# Patient Record
Sex: Male | Born: 1935 | Race: White | Hispanic: No | Marital: Married | Smoking: Former smoker
Health system: Southern US, Community
[De-identification: ages and names within clinical notes are randomized; demographics above are authoritative.]

## PROBLEM LIST (undated history)

## (undated) DIAGNOSIS — Z8572 Personal history of non-Hodgkin lymphomas: Secondary | ICD-10-CM

## (undated) DIAGNOSIS — F410 Panic disorder [episodic paroxysmal anxiety] without agoraphobia: Secondary | ICD-10-CM

## (undated) DIAGNOSIS — E7800 Pure hypercholesterolemia, unspecified: Secondary | ICD-10-CM

## (undated) DIAGNOSIS — C44310 Basal cell carcinoma of skin of unspecified parts of face: Secondary | ICD-10-CM

## (undated) DIAGNOSIS — M1990 Unspecified osteoarthritis, unspecified site: Secondary | ICD-10-CM

## (undated) DIAGNOSIS — I2510 Atherosclerotic heart disease of native coronary artery without angina pectoris: Secondary | ICD-10-CM

## (undated) DIAGNOSIS — D472 Monoclonal gammopathy: Principal | ICD-10-CM

## (undated) DIAGNOSIS — C4490 Unspecified malignant neoplasm of skin, unspecified: Secondary | ICD-10-CM

## (undated) DIAGNOSIS — M549 Dorsalgia, unspecified: Secondary | ICD-10-CM

## (undated) DIAGNOSIS — M889 Osteitis deformans of unspecified bone: Secondary | ICD-10-CM

## (undated) DIAGNOSIS — A498 Other bacterial infections of unspecified site: Secondary | ICD-10-CM

## (undated) DIAGNOSIS — C8590 Non-Hodgkin lymphoma, unspecified, unspecified site: Secondary | ICD-10-CM

## (undated) DIAGNOSIS — I10 Essential (primary) hypertension: Secondary | ICD-10-CM

## (undated) DIAGNOSIS — K219 Gastro-esophageal reflux disease without esophagitis: Secondary | ICD-10-CM

## (undated) DIAGNOSIS — J310 Chronic rhinitis: Secondary | ICD-10-CM

## (undated) DIAGNOSIS — A0472 Enterocolitis due to Clostridium difficile, not specified as recurrent: Secondary | ICD-10-CM

## (undated) HISTORY — PX: BACK SURGERY: SHX140

## (undated) HISTORY — DX: Dorsalgia, unspecified: M54.9

## (undated) HISTORY — PX: HERNIA REPAIR: SHX51

## (undated) HISTORY — DX: Monoclonal gammopathy: D47.2

## (undated) HISTORY — PX: NECK SURGERY: SHX720

## (undated) HISTORY — PX: PROSTATECTOMY: SHX69

## (undated) HISTORY — DX: Chronic rhinitis: J31.0

## (undated) HISTORY — DX: Basal cell carcinoma of skin of unspecified parts of face: C44.310

## (undated) HISTORY — DX: Personal history of non-Hodgkin lymphomas: Z85.72

## (undated) HISTORY — PX: SKIN BIOPSY: SHX1

## (undated) HISTORY — DX: Atherosclerotic heart disease of native coronary artery without angina pectoris: I25.10

---

## 1998-02-28 ENCOUNTER — Other Ambulatory Visit: Admission: RE | Admit: 1998-02-28 | Discharge: 1998-02-28 | Payer: Self-pay | Admitting: Hematology and Oncology

## 1998-05-23 ENCOUNTER — Other Ambulatory Visit: Admission: RE | Admit: 1998-05-23 | Discharge: 1998-05-23 | Payer: Self-pay | Admitting: Hematology and Oncology

## 1998-08-06 ENCOUNTER — Emergency Department (HOSPITAL_COMMUNITY): Admission: EM | Admit: 1998-08-06 | Discharge: 1998-08-06 | Payer: Self-pay | Admitting: Emergency Medicine

## 1998-09-06 ENCOUNTER — Ambulatory Visit (HOSPITAL_COMMUNITY): Admission: RE | Admit: 1998-09-06 | Discharge: 1998-09-06 | Payer: Self-pay | Admitting: General Surgery

## 1998-11-07 ENCOUNTER — Ambulatory Visit (HOSPITAL_BASED_OUTPATIENT_CLINIC_OR_DEPARTMENT_OTHER): Admission: RE | Admit: 1998-11-07 | Discharge: 1998-11-07 | Payer: Self-pay | Admitting: Urology

## 1998-11-07 ENCOUNTER — Emergency Department (HOSPITAL_COMMUNITY): Admission: EM | Admit: 1998-11-07 | Discharge: 1998-11-07 | Payer: Self-pay | Admitting: Emergency Medicine

## 1999-12-07 ENCOUNTER — Encounter: Payer: Self-pay | Admitting: Oncology

## 1999-12-07 ENCOUNTER — Encounter: Admission: RE | Admit: 1999-12-07 | Discharge: 1999-12-07 | Payer: Self-pay | Admitting: Oncology

## 1999-12-10 ENCOUNTER — Encounter: Payer: Self-pay | Admitting: Oncology

## 1999-12-10 ENCOUNTER — Encounter: Admission: RE | Admit: 1999-12-10 | Discharge: 1999-12-10 | Payer: Self-pay | Admitting: Oncology

## 2000-04-04 ENCOUNTER — Encounter: Payer: Self-pay | Admitting: Family Medicine

## 2000-04-04 ENCOUNTER — Encounter: Admission: RE | Admit: 2000-04-04 | Discharge: 2000-04-04 | Payer: Self-pay | Admitting: Family Medicine

## 2000-07-28 ENCOUNTER — Encounter: Payer: Self-pay | Admitting: Emergency Medicine

## 2000-07-28 ENCOUNTER — Inpatient Hospital Stay (HOSPITAL_COMMUNITY): Admission: EM | Admit: 2000-07-28 | Discharge: 2000-07-30 | Payer: Self-pay | Admitting: Emergency Medicine

## 2000-07-30 ENCOUNTER — Encounter: Payer: Self-pay | Admitting: Cardiology

## 2001-06-12 ENCOUNTER — Encounter: Payer: Self-pay | Admitting: Oncology

## 2001-06-12 ENCOUNTER — Encounter: Admission: RE | Admit: 2001-06-12 | Discharge: 2001-06-12 | Payer: Self-pay | Admitting: Oncology

## 2001-10-07 ENCOUNTER — Encounter: Admission: RE | Admit: 2001-10-07 | Discharge: 2001-10-07 | Payer: Self-pay | Admitting: Family Medicine

## 2001-10-07 ENCOUNTER — Encounter: Payer: Self-pay | Admitting: Family Medicine

## 2001-10-21 ENCOUNTER — Encounter: Payer: Self-pay | Admitting: Family Medicine

## 2001-10-21 ENCOUNTER — Encounter: Admission: RE | Admit: 2001-10-21 | Discharge: 2001-10-21 | Payer: Self-pay | Admitting: Family Medicine

## 2001-11-27 ENCOUNTER — Encounter: Admission: RE | Admit: 2001-11-27 | Discharge: 2001-11-27 | Payer: Self-pay | Admitting: Family Medicine

## 2001-11-27 ENCOUNTER — Encounter: Payer: Self-pay | Admitting: Family Medicine

## 2001-12-17 ENCOUNTER — Ambulatory Visit (HOSPITAL_COMMUNITY): Admission: RE | Admit: 2001-12-17 | Discharge: 2001-12-17 | Payer: Self-pay | Admitting: Neurosurgery

## 2001-12-17 ENCOUNTER — Encounter: Payer: Self-pay | Admitting: Neurosurgery

## 2001-12-31 ENCOUNTER — Inpatient Hospital Stay (HOSPITAL_COMMUNITY): Admission: RE | Admit: 2001-12-31 | Discharge: 2002-01-01 | Payer: Self-pay | Admitting: Neurosurgery

## 2001-12-31 ENCOUNTER — Encounter: Payer: Self-pay | Admitting: Neurosurgery

## 2002-04-01 ENCOUNTER — Ambulatory Visit (HOSPITAL_BASED_OUTPATIENT_CLINIC_OR_DEPARTMENT_OTHER): Admission: RE | Admit: 2002-04-01 | Discharge: 2002-04-01 | Payer: Self-pay | Admitting: Urology

## 2002-06-14 ENCOUNTER — Encounter: Admission: RE | Admit: 2002-06-14 | Discharge: 2002-06-14 | Payer: Self-pay | Admitting: Oncology

## 2002-06-14 ENCOUNTER — Encounter: Payer: Self-pay | Admitting: Oncology

## 2002-11-09 ENCOUNTER — Encounter: Payer: Self-pay | Admitting: Neurosurgery

## 2002-11-11 ENCOUNTER — Inpatient Hospital Stay (HOSPITAL_COMMUNITY): Admission: RE | Admit: 2002-11-11 | Discharge: 2002-11-12 | Payer: Self-pay | Admitting: Neurosurgery

## 2002-11-11 ENCOUNTER — Encounter: Payer: Self-pay | Admitting: Neurosurgery

## 2003-01-18 ENCOUNTER — Encounter: Payer: Self-pay | Admitting: Family Medicine

## 2003-01-18 ENCOUNTER — Encounter: Admission: RE | Admit: 2003-01-18 | Discharge: 2003-01-18 | Payer: Self-pay | Admitting: Family Medicine

## 2003-02-21 ENCOUNTER — Ambulatory Visit (HOSPITAL_COMMUNITY): Admission: RE | Admit: 2003-02-21 | Discharge: 2003-02-21 | Payer: Self-pay | Admitting: Family Medicine

## 2003-02-21 ENCOUNTER — Encounter: Payer: Self-pay | Admitting: Family Medicine

## 2003-03-23 ENCOUNTER — Encounter: Admission: RE | Admit: 2003-03-23 | Discharge: 2003-03-23 | Payer: Self-pay | Admitting: Family Medicine

## 2003-06-13 ENCOUNTER — Encounter: Payer: Self-pay | Admitting: Oncology

## 2003-06-13 ENCOUNTER — Encounter: Admission: RE | Admit: 2003-06-13 | Discharge: 2003-06-13 | Payer: Self-pay | Admitting: Oncology

## 2004-03-08 ENCOUNTER — Ambulatory Visit (HOSPITAL_COMMUNITY): Admission: RE | Admit: 2004-03-08 | Discharge: 2004-03-08 | Payer: Self-pay | Admitting: Gastroenterology

## 2004-03-08 ENCOUNTER — Encounter (INDEPENDENT_AMBULATORY_CARE_PROVIDER_SITE_OTHER): Payer: Self-pay | Admitting: *Deleted

## 2004-12-04 ENCOUNTER — Encounter: Admission: RE | Admit: 2004-12-04 | Discharge: 2004-12-04 | Payer: Self-pay | Admitting: Orthopedic Surgery

## 2004-12-22 ENCOUNTER — Encounter: Admission: RE | Admit: 2004-12-22 | Discharge: 2004-12-22 | Payer: Self-pay | Admitting: Internal Medicine

## 2005-02-01 ENCOUNTER — Encounter: Admission: RE | Admit: 2005-02-01 | Discharge: 2005-02-01 | Payer: Self-pay | Admitting: Family Medicine

## 2005-02-05 ENCOUNTER — Inpatient Hospital Stay (HOSPITAL_COMMUNITY): Admission: EM | Admit: 2005-02-05 | Discharge: 2005-02-08 | Payer: Self-pay | Admitting: Emergency Medicine

## 2005-02-08 ENCOUNTER — Inpatient Hospital Stay (HOSPITAL_COMMUNITY)
Admission: RE | Admit: 2005-02-08 | Discharge: 2005-02-19 | Payer: Self-pay | Admitting: Physical Medicine & Rehabilitation

## 2005-02-08 ENCOUNTER — Ambulatory Visit: Payer: Self-pay | Admitting: Physical Medicine & Rehabilitation

## 2005-04-09 ENCOUNTER — Ambulatory Visit: Payer: Self-pay | Admitting: Oncology

## 2005-06-18 ENCOUNTER — Ambulatory Visit (HOSPITAL_COMMUNITY): Admission: RE | Admit: 2005-06-18 | Discharge: 2005-06-18 | Payer: Self-pay | Admitting: Orthopedic Surgery

## 2005-10-15 ENCOUNTER — Ambulatory Visit: Payer: Self-pay | Admitting: Oncology

## 2005-12-23 ENCOUNTER — Ambulatory Visit: Payer: Self-pay | Admitting: Oncology

## 2006-04-11 ENCOUNTER — Encounter: Admission: RE | Admit: 2006-04-11 | Discharge: 2006-04-11 | Payer: Self-pay | Admitting: Orthopedic Surgery

## 2006-04-11 ENCOUNTER — Ambulatory Visit: Payer: Self-pay | Admitting: Oncology

## 2006-04-15 LAB — CBC WITH DIFFERENTIAL/PLATELET
BASO%: 0.6 % (ref 0.0–2.0)
EOS%: 2.6 % (ref 0.0–7.0)
HCT: 34.8 % — ABNORMAL LOW (ref 38.7–49.9)
LYMPH%: 27 % (ref 14.0–48.0)
MCH: 32.5 pg (ref 28.0–33.4)
MCHC: 34.8 g/dL (ref 32.0–35.9)
MONO%: 11.3 % (ref 0.0–13.0)
NEUT%: 58.5 % (ref 40.0–75.0)
Platelets: 150 10*3/uL (ref 145–400)

## 2006-04-17 LAB — COMPREHENSIVE METABOLIC PANEL
ALT: 8 U/L (ref 0–40)
AST: 12 U/L (ref 0–37)
CO2: 24 mEq/L (ref 19–32)
Calcium: 9.1 mg/dL (ref 8.4–10.5)
Chloride: 108 mEq/L (ref 96–112)
Creatinine, Ser: 1.05 mg/dL (ref 0.40–1.50)
Sodium: 141 mEq/L (ref 135–145)
Total Protein: 7 g/dL (ref 6.0–8.3)

## 2006-04-17 LAB — LACTATE DEHYDROGENASE: LDH: 122 U/L (ref 94–250)

## 2006-04-17 LAB — IMMUNOFIXATION ELECTROPHORESIS
IgA: 130 mg/dL (ref 68–378)
IgG (Immunoglobin G), Serum: 1030 mg/dL (ref 694–1618)

## 2006-04-23 LAB — UIFE/LIGHT CHAINS/TP QN, 24-HR UR
Beta, Urine: DETECTED — AB
Free Lambda Excretion/Day: 2.47 mg/d
Free Lambda Lt Chains,Ur: 0.13 mg/dL (ref 0.08–1.01)
Free Lt Chn Excr Rate: 142.88 mg/d
Gamma Globulin, Urine: DETECTED — AB
Time: 24 hours
Volume, Urine: 1900 mL

## 2006-07-04 ENCOUNTER — Encounter: Admission: RE | Admit: 2006-07-04 | Discharge: 2006-07-04 | Payer: Self-pay | Admitting: Gastroenterology

## 2006-08-07 ENCOUNTER — Ambulatory Visit: Payer: Self-pay | Admitting: Oncology

## 2007-02-04 ENCOUNTER — Ambulatory Visit: Payer: Self-pay | Admitting: Oncology

## 2007-02-09 LAB — CBC WITH DIFFERENTIAL/PLATELET
BASO%: 0.7 % (ref 0.0–2.0)
Basophils Absolute: 0 10*3/uL (ref 0.0–0.1)
EOS%: 2.3 % (ref 0.0–7.0)
HCT: 37.5 % — ABNORMAL LOW (ref 38.7–49.9)
HGB: 13.5 g/dL (ref 13.0–17.1)
LYMPH%: 22.9 % (ref 14.0–48.0)
MCH: 33.4 pg (ref 28.0–33.4)
MCHC: 36.1 g/dL — ABNORMAL HIGH (ref 32.0–35.9)
MCV: 92.6 fL (ref 81.6–98.0)
MONO%: 9.7 % (ref 0.0–13.0)
NEUT%: 64.4 % (ref 40.0–75.0)
Platelets: 141 10*3/uL — ABNORMAL LOW (ref 145–400)
lymph#: 1 10*3/uL (ref 0.9–3.3)

## 2007-02-09 LAB — MORPHOLOGY

## 2007-02-15 LAB — IMMUNOFIXATION ELECTROPHORESIS
IgA: 156 mg/dL (ref 68–378)
IgG (Immunoglobin G), Serum: 1260 mg/dL (ref 694–1618)

## 2007-02-15 LAB — COMPREHENSIVE METABOLIC PANEL
Albumin: 4.5 g/dL (ref 3.5–5.2)
Alkaline Phosphatase: 89 U/L (ref 39–117)
Calcium: 9.8 mg/dL (ref 8.4–10.5)
Chloride: 105 mEq/L (ref 96–112)
Glucose, Bld: 115 mg/dL — ABNORMAL HIGH (ref 70–99)
Potassium: 3.9 mEq/L (ref 3.5–5.3)
Sodium: 140 mEq/L (ref 135–145)
Total Protein: 7.3 g/dL (ref 6.0–8.3)

## 2007-02-15 LAB — KAPPA/LAMBDA LIGHT CHAINS: Kappa free light chain: 12.4 mg/dL — ABNORMAL HIGH (ref 0.33–1.94)

## 2007-08-07 ENCOUNTER — Ambulatory Visit: Payer: Self-pay | Admitting: Oncology

## 2007-08-11 LAB — CBC WITH DIFFERENTIAL/PLATELET
Basophils Absolute: 0 10*3/uL (ref 0.0–0.1)
Eosinophils Absolute: 0.1 10*3/uL (ref 0.0–0.5)
HCT: 36.1 % — ABNORMAL LOW (ref 38.7–49.9)
HGB: 12.9 g/dL — ABNORMAL LOW (ref 13.0–17.1)
MONO#: 0.4 10*3/uL (ref 0.1–0.9)
NEUT#: 2.4 10*3/uL (ref 1.5–6.5)
NEUT%: 65 % (ref 40.0–75.0)
WBC: 3.7 10*3/uL — ABNORMAL LOW (ref 4.0–10.0)
lymph#: 0.8 10*3/uL — ABNORMAL LOW (ref 0.9–3.3)

## 2007-08-11 LAB — MORPHOLOGY: PLT EST: DECREASED

## 2007-08-13 LAB — COMPREHENSIVE METABOLIC PANEL
ALT: 11 U/L (ref 0–53)
AST: 15 U/L (ref 0–37)
CO2: 26 mEq/L (ref 19–32)
Calcium: 9.4 mg/dL (ref 8.4–10.5)
Chloride: 105 mEq/L (ref 96–112)
Sodium: 140 mEq/L (ref 135–145)
Total Protein: 6.7 g/dL (ref 6.0–8.3)

## 2007-08-13 LAB — KAPPA/LAMBDA LIGHT CHAINS
Kappa free light chain: 7.24 mg/dL — ABNORMAL HIGH (ref 0.33–1.94)
Kappa:Lambda Ratio: 5.79 — ABNORMAL HIGH (ref 0.26–1.65)
Lambda Free Lght Chn: 1.25 mg/dL (ref 0.57–2.63)

## 2007-08-13 LAB — IMMUNOFIXATION ELECTROPHORESIS: IgM, Serum: 75 mg/dL (ref 60–263)

## 2007-08-13 LAB — LACTATE DEHYDROGENASE: LDH: 126 U/L (ref 94–250)

## 2007-08-14 ENCOUNTER — Inpatient Hospital Stay (HOSPITAL_COMMUNITY): Admission: EM | Admit: 2007-08-14 | Discharge: 2007-08-15 | Payer: Self-pay | Admitting: Emergency Medicine

## 2007-08-19 LAB — UIFE/LIGHT CHAINS/TP QN, 24-HR UR
Beta, Urine: DETECTED — AB
Free Lambda Excretion/Day: 5.24 mg/d
Free Lambda Lt Chains,Ur: 0.31 mg/dL (ref 0.08–1.01)
Time: 24 hours
Total Protein, Urine: 15.7 mg/dL

## 2007-11-04 ENCOUNTER — Ambulatory Visit: Payer: Self-pay | Admitting: Oncology

## 2007-11-10 ENCOUNTER — Ambulatory Visit (HOSPITAL_COMMUNITY): Admission: RE | Admit: 2007-11-10 | Discharge: 2007-11-10 | Payer: Self-pay | Admitting: Oncology

## 2007-11-10 LAB — CBC WITH DIFFERENTIAL/PLATELET
BASO%: 0.3 % (ref 0.0–2.0)
HCT: 35.4 % — ABNORMAL LOW (ref 38.7–49.9)
MCHC: 35.6 g/dL (ref 32.0–35.9)
MONO#: 0.5 10*3/uL (ref 0.1–0.9)
RBC: 3.75 10*6/uL — ABNORMAL LOW (ref 4.20–5.71)
WBC: 3.8 10*3/uL — ABNORMAL LOW (ref 4.0–10.0)
lymph#: 1.1 10*3/uL (ref 0.9–3.3)

## 2007-11-10 LAB — MORPHOLOGY: PLT EST: ADEQUATE

## 2007-11-11 LAB — COMPREHENSIVE METABOLIC PANEL
AST: 17 U/L (ref 0–37)
Alkaline Phosphatase: 92 U/L (ref 39–117)
BUN: 24 mg/dL — ABNORMAL HIGH (ref 6–23)
Calcium: 9.3 mg/dL (ref 8.4–10.5)
Creatinine, Ser: 1.23 mg/dL (ref 0.40–1.50)

## 2007-11-11 LAB — KAPPA/LAMBDA LIGHT CHAINS: Kappa:Lambda Ratio: 3.94 — ABNORMAL HIGH (ref 0.26–1.65)

## 2008-02-04 ENCOUNTER — Ambulatory Visit: Payer: Self-pay | Admitting: Oncology

## 2008-02-09 LAB — MORPHOLOGY: RBC Comments: NORMAL

## 2008-02-09 LAB — CBC WITH DIFFERENTIAL/PLATELET
BASO%: 0.5 % (ref 0.0–2.0)
Eosinophils Absolute: 0.1 10*3/uL (ref 0.0–0.5)
LYMPH%: 11.6 % — ABNORMAL LOW (ref 14.0–48.0)
MCHC: 36.4 g/dL — ABNORMAL HIGH (ref 32.0–35.9)
MONO#: 0.6 10*3/uL (ref 0.1–0.9)
NEUT#: 5.4 10*3/uL (ref 1.5–6.5)
Platelets: 141 10*3/uL — ABNORMAL LOW (ref 145–400)
RBC: 3.69 10*6/uL — ABNORMAL LOW (ref 4.20–5.71)
RDW: 13.3 % (ref 11.2–14.6)
WBC: 6.9 10*3/uL (ref 4.0–10.0)

## 2008-02-10 LAB — IGG: IgG (Immunoglobin G), Serum: 1190 mg/dL (ref 694–1618)

## 2008-02-10 LAB — COMPREHENSIVE METABOLIC PANEL
AST: 11 U/L (ref 0–37)
Alkaline Phosphatase: 72 U/L (ref 39–117)
BUN: 21 mg/dL (ref 6–23)
Glucose, Bld: 93 mg/dL (ref 70–99)
Sodium: 139 mEq/L (ref 135–145)
Total Bilirubin: 0.6 mg/dL (ref 0.3–1.2)
Total Protein: 7 g/dL (ref 6.0–8.3)

## 2008-02-10 LAB — KAPPA/LAMBDA LIGHT CHAINS: Kappa:Lambda Ratio: 4.84 — ABNORMAL HIGH (ref 0.26–1.65)

## 2008-02-18 LAB — UIFE/LIGHT CHAINS/TP QN, 24-HR UR
Beta, Urine: DETECTED — AB
Free Lambda Excretion/Day: 6.48 mg/d
Free Lambda Lt Chains,Ur: 0.48 mg/dL (ref 0.08–1.01)
Free Lt Chn Excr Rate: 163.35 mg/d
Gamma Globulin, Urine: DETECTED — AB
Time: 24 hours
Total Protein, Urine-Ur/day: 296 mg/d — ABNORMAL HIGH (ref 10–140)
Volume, Urine: 1350 mL

## 2008-08-12 ENCOUNTER — Ambulatory Visit: Payer: Self-pay | Admitting: Oncology

## 2008-08-16 LAB — MORPHOLOGY: PLT EST: DECREASED

## 2008-08-16 LAB — COMPREHENSIVE METABOLIC PANEL
ALT: 11 U/L (ref 0–53)
Alkaline Phosphatase: 62 U/L (ref 39–117)
Sodium: 137 mEq/L (ref 135–145)
Total Bilirubin: 0.6 mg/dL (ref 0.3–1.2)
Total Protein: 6.7 g/dL (ref 6.0–8.3)

## 2008-08-16 LAB — CBC WITH DIFFERENTIAL/PLATELET
Basophils Absolute: 0 10*3/uL (ref 0.0–0.1)
Eosinophils Absolute: 0.1 10*3/uL (ref 0.0–0.5)
HGB: 12.5 g/dL — ABNORMAL LOW (ref 13.0–17.1)
LYMPH%: 21.2 % (ref 14.0–48.0)
MONO#: 0.3 10*3/uL (ref 0.1–0.9)
NEUT#: 2.1 10*3/uL (ref 1.5–6.5)
Platelets: 126 10*3/uL — ABNORMAL LOW (ref 145–400)
RBC: 3.69 10*6/uL — ABNORMAL LOW (ref 4.20–5.71)
WBC: 3.2 10*3/uL — ABNORMAL LOW (ref 4.0–10.0)

## 2008-08-16 LAB — KAPPA/LAMBDA LIGHT CHAINS: Kappa:Lambda Ratio: 6.42 — ABNORMAL HIGH (ref 0.26–1.65)

## 2008-08-25 LAB — UIFE/LIGHT CHAINS/TP QN, 24-HR UR
Albumin, U: DETECTED
Alpha 1, Urine: DETECTED — AB
Free Kappa/Lambda Ratio: 60.37 ratio — ABNORMAL HIGH (ref 0.46–4.00)
Free Lambda Excretion/Day: 4.05 mg/d
Free Lambda Lt Chains,Ur: 0.27 mg/dL (ref 0.08–1.01)
Time: 24 hours
Total Protein, Urine-Ur/day: 449 mg/d — ABNORMAL HIGH (ref 10–140)
Total Protein, Urine: 29.9 mg/dL
Volume, Urine: 1500 mL

## 2008-09-15 ENCOUNTER — Inpatient Hospital Stay (HOSPITAL_COMMUNITY): Admission: AD | Admit: 2008-09-15 | Discharge: 2008-09-19 | Payer: Self-pay | Admitting: Urology

## 2009-02-07 ENCOUNTER — Encounter: Admission: RE | Admit: 2009-02-07 | Discharge: 2009-02-07 | Payer: Self-pay | Admitting: Cardiology

## 2009-02-08 ENCOUNTER — Ambulatory Visit (HOSPITAL_COMMUNITY): Admission: AD | Admit: 2009-02-08 | Discharge: 2009-02-09 | Payer: Self-pay | Admitting: Cardiology

## 2009-02-08 DIAGNOSIS — I2510 Atherosclerotic heart disease of native coronary artery without angina pectoris: Secondary | ICD-10-CM

## 2009-02-08 HISTORY — DX: Atherosclerotic heart disease of native coronary artery without angina pectoris: I25.10

## 2009-02-08 HISTORY — PX: CARDIAC CATHETERIZATION: SHX172

## 2009-02-10 ENCOUNTER — Ambulatory Visit: Payer: Self-pay | Admitting: Oncology

## 2009-02-14 LAB — CBC WITH DIFFERENTIAL/PLATELET
Basophils Absolute: 0 10*3/uL (ref 0.0–0.1)
Eosinophils Absolute: 0.1 10*3/uL (ref 0.0–0.5)
HGB: 12.1 g/dL — ABNORMAL LOW (ref 13.0–17.1)
LYMPH%: 18 % (ref 14.0–49.0)
MCV: 93 fL (ref 79.3–98.0)
MONO#: 0.4 10*3/uL (ref 0.1–0.9)
MONO%: 8.5 % (ref 0.0–14.0)
NEUT#: 3 10*3/uL (ref 1.5–6.5)
Platelets: 133 10*3/uL — ABNORMAL LOW (ref 140–400)
RDW: 15.3 % — ABNORMAL HIGH (ref 11.0–14.6)

## 2009-02-14 LAB — MORPHOLOGY: PLT EST: DECREASED

## 2009-02-15 LAB — KAPPA/LAMBDA LIGHT CHAINS
Kappa free light chain: 9.53 mg/dL — ABNORMAL HIGH (ref 0.33–1.94)
Lambda Free Lght Chn: 1.13 mg/dL (ref 0.57–2.63)

## 2009-02-23 LAB — UIFE/LIGHT CHAINS/TP QN, 24-HR UR
Alpha 1, Urine: DETECTED — AB
Beta, Urine: DETECTED — AB
Free Kappa Lt Chains,Ur: 7.01 mg/dL — ABNORMAL HIGH (ref 0.04–1.51)
Free Lambda Excretion/Day: 4.93 mg/d
Gamma Globulin, Urine: DETECTED — AB
Time: 24 hours

## 2009-03-11 ENCOUNTER — Encounter: Admission: RE | Admit: 2009-03-11 | Discharge: 2009-03-11 | Payer: Self-pay | Admitting: Orthopedic Surgery

## 2009-08-18 ENCOUNTER — Ambulatory Visit: Payer: Self-pay | Admitting: Oncology

## 2009-08-22 LAB — CBC WITH DIFFERENTIAL/PLATELET
BASO%: 0.3 % (ref 0.0–2.0)
Basophils Absolute: 0 10*3/uL (ref 0.0–0.1)
EOS%: 2.3 % (ref 0.0–7.0)
Eosinophils Absolute: 0.1 10*3/uL (ref 0.0–0.5)
HCT: 34.9 % — ABNORMAL LOW (ref 38.4–49.9)
HGB: 12.6 g/dL — ABNORMAL LOW (ref 13.0–17.1)
LYMPH%: 26.1 % (ref 14.0–49.0)
MCH: 34 pg — ABNORMAL HIGH (ref 27.2–33.4)
MCHC: 36.2 g/dL — ABNORMAL HIGH (ref 32.0–36.0)
MCV: 94.1 fL (ref 79.3–98.0)
MONO#: 0.5 10*3/uL (ref 0.1–0.9)
MONO%: 12.7 % (ref 0.0–14.0)
NEUT#: 2.2 10*3/uL (ref 1.5–6.5)
NEUT%: 58.6 % (ref 39.0–75.0)
Platelets: 125 10*3/uL — ABNORMAL LOW (ref 140–400)
RBC: 3.71 10*6/uL — ABNORMAL LOW (ref 4.20–5.82)
RDW: 14.3 % (ref 11.0–14.6)
WBC: 3.8 10*3/uL — ABNORMAL LOW (ref 4.0–10.3)
lymph#: 1 10*3/uL (ref 0.9–3.3)

## 2009-08-22 LAB — MORPHOLOGY: PLT EST: DECREASED

## 2009-08-24 LAB — IMMUNOFIXATION ELECTROPHORESIS
IgA: 137 mg/dL (ref 68–378)
IgM, Serum: 58 mg/dL — ABNORMAL LOW (ref 60–263)
Total Protein, Serum Electrophoresis: 7.2 g/dL (ref 6.0–8.3)

## 2009-08-24 LAB — COMPREHENSIVE METABOLIC PANEL
ALT: 12 U/L (ref 0–53)
Alkaline Phosphatase: 62 U/L (ref 39–117)
CO2: 24 mEq/L (ref 19–32)
Creatinine, Ser: 1.21 mg/dL (ref 0.40–1.50)
Glucose, Bld: 118 mg/dL — ABNORMAL HIGH (ref 70–99)
Sodium: 140 mEq/L (ref 135–145)
Total Bilirubin: 0.4 mg/dL (ref 0.3–1.2)
Total Protein: 7.1 g/dL (ref 6.0–8.3)

## 2009-08-24 LAB — LACTATE DEHYDROGENASE: LDH: 139 U/L (ref 94–250)

## 2009-08-25 LAB — UIFE/LIGHT CHAINS/TP QN, 24-HR UR
Alpha 1, Urine: DETECTED — AB
Alpha 2, Urine: DETECTED — AB
Free Kappa Lt Chains,Ur: 8.87 mg/dL — ABNORMAL HIGH (ref 0.04–1.51)
Gamma Globulin, Urine: DETECTED — AB

## 2009-09-07 ENCOUNTER — Other Ambulatory Visit: Payer: Self-pay | Admitting: Urology

## 2009-09-13 ENCOUNTER — Encounter: Payer: Self-pay | Admitting: Urology

## 2009-09-13 ENCOUNTER — Other Ambulatory Visit: Payer: Self-pay | Admitting: Urology

## 2009-09-14 ENCOUNTER — Other Ambulatory Visit: Payer: Self-pay | Admitting: Urology

## 2009-09-14 ENCOUNTER — Inpatient Hospital Stay (HOSPITAL_COMMUNITY): Admission: AD | Admit: 2009-09-14 | Discharge: 2009-09-16 | Payer: Self-pay | Admitting: Urology

## 2009-09-22 ENCOUNTER — Emergency Department (HOSPITAL_COMMUNITY): Admission: EM | Admit: 2009-09-22 | Discharge: 2009-09-23 | Payer: Self-pay | Admitting: Emergency Medicine

## 2009-09-23 ENCOUNTER — Inpatient Hospital Stay (HOSPITAL_COMMUNITY): Admission: AD | Admit: 2009-09-23 | Discharge: 2009-09-26 | Payer: Self-pay | Admitting: Urology

## 2010-02-09 ENCOUNTER — Ambulatory Visit: Payer: Self-pay | Admitting: Oncology

## 2010-02-13 LAB — CBC WITH DIFFERENTIAL/PLATELET
Basophils Absolute: 0 10*3/uL (ref 0.0–0.1)
Eosinophils Absolute: 0.1 10*3/uL (ref 0.0–0.5)
HCT: 36.1 % — ABNORMAL LOW (ref 38.4–49.9)
HGB: 12.6 g/dL — ABNORMAL LOW (ref 13.0–17.1)
LYMPH%: 21.5 % (ref 14.0–49.0)
MCHC: 34.9 g/dL (ref 32.0–36.0)
MONO#: 0.8 10*3/uL (ref 0.1–0.9)
NEUT#: 3.6 10*3/uL (ref 1.5–6.5)
NEUT%: 63.2 % (ref 39.0–75.0)
Platelets: 136 10*3/uL — ABNORMAL LOW (ref 140–400)
WBC: 5.6 10*3/uL (ref 4.0–10.3)
lymph#: 1.2 10*3/uL (ref 0.9–3.3)

## 2010-02-13 LAB — MORPHOLOGY: PLT EST: DECREASED

## 2010-02-15 LAB — COMPREHENSIVE METABOLIC PANEL
ALT: 13 U/L (ref 0–53)
Albumin: 4.4 g/dL (ref 3.5–5.2)
CO2: 23 mEq/L (ref 19–32)
Chloride: 104 mEq/L (ref 96–112)
Glucose, Bld: 98 mg/dL (ref 70–99)
Potassium: 3.7 mEq/L (ref 3.5–5.3)
Sodium: 140 mEq/L (ref 135–145)
Total Bilirubin: 0.5 mg/dL (ref 0.3–1.2)
Total Protein: 7.1 g/dL (ref 6.0–8.3)

## 2010-02-15 LAB — KAPPA/LAMBDA LIGHT CHAINS: Kappa free light chain: 7.56 mg/dL — ABNORMAL HIGH (ref 0.33–1.94)

## 2010-02-15 LAB — IMMUNOFIXATION ELECTROPHORESIS
IgM, Serum: 65 mg/dL (ref 60–263)
Total Protein, Serum Electrophoresis: 7.1 g/dL (ref 6.0–8.3)

## 2010-02-15 LAB — LACTATE DEHYDROGENASE: LDH: 128 U/L (ref 94–250)

## 2010-02-20 ENCOUNTER — Ambulatory Visit (HOSPITAL_COMMUNITY): Admission: RE | Admit: 2010-02-20 | Discharge: 2010-02-21 | Payer: Self-pay | Admitting: General Surgery

## 2010-07-24 ENCOUNTER — Encounter: Admission: RE | Admit: 2010-07-24 | Discharge: 2010-07-24 | Payer: Self-pay | Admitting: Cardiology

## 2010-08-09 ENCOUNTER — Ambulatory Visit: Payer: Self-pay | Admitting: Oncology

## 2010-08-13 LAB — CBC WITH DIFFERENTIAL/PLATELET
Eosinophils Absolute: 0.1 10*3/uL (ref 0.0–0.5)
HCT: 35.1 % — ABNORMAL LOW (ref 38.4–49.9)
HGB: 12.4 g/dL — ABNORMAL LOW (ref 13.0–17.1)
LYMPH%: 24.1 % (ref 14.0–49.0)
MONO#: 0.5 10*3/uL (ref 0.1–0.9)
NEUT#: 2.2 10*3/uL (ref 1.5–6.5)
NEUT%: 58.9 % (ref 39.0–75.0)
Platelets: 122 10*3/uL — ABNORMAL LOW (ref 140–400)
WBC: 3.7 10*3/uL — ABNORMAL LOW (ref 4.0–10.3)
lymph#: 0.9 10*3/uL (ref 0.9–3.3)

## 2010-08-14 LAB — COMPREHENSIVE METABOLIC PANEL
ALT: 12 U/L (ref 0–53)
AST: 16 U/L (ref 0–37)
Albumin: 4.2 g/dL (ref 3.5–5.2)
Alkaline Phosphatase: 63 U/L (ref 39–117)
BUN: 17 mg/dL (ref 6–23)
CO2: 24 mEq/L (ref 19–32)
Calcium: 9.7 mg/dL (ref 8.4–10.5)
Chloride: 106 mEq/L (ref 96–112)
Creatinine, Ser: 1.07 mg/dL (ref 0.40–1.50)
Glucose, Bld: 99 mg/dL (ref 70–99)
Potassium: 3.9 mEq/L (ref 3.5–5.3)
Sodium: 141 mEq/L (ref 135–145)
Total Bilirubin: 0.4 mg/dL (ref 0.3–1.2)
Total Protein: 6.8 g/dL (ref 6.0–8.3)

## 2010-08-14 LAB — KAPPA/LAMBDA LIGHT CHAINS
Kappa free light chain: 4.68 mg/dL — ABNORMAL HIGH (ref 0.33–1.94)
Kappa:Lambda Ratio: 3.16 — ABNORMAL HIGH (ref 0.26–1.65)

## 2010-08-14 LAB — LACTATE DEHYDROGENASE: LDH: 147 U/L (ref 94–250)

## 2010-08-22 LAB — UIFE/LIGHT CHAINS/TP QN, 24-HR UR
Albumin, U: DETECTED
Alpha 2, Urine: DETECTED — AB
Beta, Urine: DETECTED — AB
Free Lambda Lt Chains,Ur: 0.24 mg/dL (ref 0.08–1.01)
Total Protein, Urine-Ur/day: 245 mg/d — ABNORMAL HIGH (ref 10–140)
Volume, Urine: 1540 mL

## 2011-01-22 LAB — URINALYSIS, ROUTINE W REFLEX MICROSCOPIC
Glucose, UA: NEGATIVE mg/dL
Ketones, ur: NEGATIVE mg/dL
Protein, ur: 30 mg/dL — AB
Urobilinogen, UA: 0.2 mg/dL (ref 0.0–1.0)

## 2011-01-22 LAB — CBC
Hemoglobin: 12.6 g/dL — ABNORMAL LOW (ref 13.0–17.0)
RBC: 3.97 MIL/uL — ABNORMAL LOW (ref 4.22–5.81)
WBC: 4 10*3/uL (ref 4.0–10.5)

## 2011-01-22 LAB — DIFFERENTIAL
Basophils Relative: 1 % (ref 0–1)
Eosinophils Absolute: 0.1 10*3/uL (ref 0.0–0.7)
Eosinophils Relative: 3 % (ref 0–5)
Lymphs Abs: 0.9 10*3/uL (ref 0.7–4.0)
Neutrophils Relative %: 62 % (ref 43–77)

## 2011-01-22 LAB — COMPREHENSIVE METABOLIC PANEL
ALT: 14 U/L (ref 0–53)
AST: 17 U/L (ref 0–37)
Alkaline Phosphatase: 56 U/L (ref 39–117)
CO2: 28 mEq/L (ref 19–32)
Calcium: 9.2 mg/dL (ref 8.4–10.5)
Chloride: 106 mEq/L (ref 96–112)
GFR calc Af Amer: >60 mL/min (ref >60)
GFR calc non Af Amer: 59 mL/min — ABNORMAL LOW (ref >60)
Glucose, Bld: 114 mg/dL — ABNORMAL HIGH (ref 70–99)
Potassium: 3.9 mEq/L (ref 3.5–5.1)
Sodium: 139 mEq/L (ref 135–145)

## 2011-01-22 LAB — URINE MICROSCOPIC-ADD ON

## 2011-02-06 LAB — CBC
HCT: 24.5 % — ABNORMAL LOW (ref 39.0–52.0)
HCT: 25.5 % — ABNORMAL LOW (ref 39.0–52.0)
HCT: 26.6 % — ABNORMAL LOW (ref 39.0–52.0)
Hemoglobin: 8.9 g/dL — ABNORMAL LOW (ref 13.0–17.0)
Hemoglobin: 9.3 g/dL — ABNORMAL LOW (ref 13.0–17.0)
MCHC: 34.4 g/dL (ref 30.0–36.0)
MCHC: 35 g/dL (ref 30.0–36.0)
MCV: 94.8 fL (ref 78.0–100.0)
MCV: 96 fL (ref 78.0–100.0)
MCV: 94.6 fL (ref 78.0–100.0)
Platelets: 166 10*3/uL (ref 150–400)
Platelets: 119 10*3/uL — ABNORMAL LOW (ref 150–400)
RDW: 13.6 % (ref 11.5–15.5)
RDW: 13.6 % (ref 11.5–15.5)
RDW: 13.8 % (ref 11.5–15.5)
WBC: 6.6 10*3/uL (ref 4.0–10.5)
WBC: 6.9 10*3/uL (ref 4.0–10.5)

## 2011-02-06 LAB — DIFFERENTIAL
Basophils Absolute: 0 10*3/uL (ref 0.0–0.1)
Eosinophils Absolute: 0.1 10*3/uL (ref 0.0–0.7)
Eosinophils Absolute: 0 10*3/uL (ref 0.0–0.7)
Eosinophils Relative: 2 % (ref 0–5)
Lymphocytes Relative: 10 % — ABNORMAL LOW (ref 12–46)
Lymphs Abs: 0.6 10*3/uL — ABNORMAL LOW (ref 0.7–4.0)
Lymphs Abs: 1 10*3/uL (ref 0.7–4.0)
Monocytes Absolute: 0.6 10*3/uL (ref 0.1–1.0)
Neutrophils Relative %: 75 % (ref 43–77)

## 2011-02-06 LAB — BASIC METABOLIC PANEL
BUN: 9 mg/dL (ref 6–23)
CO2: 27 mEq/L (ref 19–32)
Calcium: 9.4 mg/dL (ref 8.4–10.5)
Creatinine, Ser: 1.11 mg/dL (ref 0.4–1.5)
GFR calc Af Amer: >60 mL/min (ref >60)
GFR calc non Af Amer: >60 mL/min (ref >60)
GFR calc non Af Amer: 60 mL/min — ABNORMAL LOW (ref >60)
Glucose, Bld: 110 mg/dL — ABNORMAL HIGH (ref 70–99)
Glucose, Bld: 124 mg/dL — ABNORMAL HIGH (ref 70–99)
Glucose, Bld: 79 mg/dL (ref 70–99)
Potassium: 3.4 mEq/L — ABNORMAL LOW (ref 3.5–5.1)
Potassium: 3.9 mEq/L (ref 3.5–5.1)
Sodium: 141 mEq/L (ref 135–145)
Sodium: 139 mEq/L (ref 135–145)

## 2011-02-06 LAB — PLATELET COUNT: Platelets: 120 10*3/uL — ABNORMAL LOW (ref 150–400)

## 2011-02-06 LAB — POCT I-STAT, CHEM 8
BUN: 13 mg/dL (ref 6–23)
Calcium, Ion: 1.18 mmol/L (ref 1.12–1.32)
Creatinine, Ser: 1.4 mg/dL (ref 0.4–1.5)
Glucose, Bld: 103 mg/dL — ABNORMAL HIGH (ref 70–99)
TCO2: 21 mmol/L (ref 0–100)

## 2011-02-06 LAB — URINALYSIS, ROUTINE W REFLEX MICROSCOPIC
Ketones, ur: 15 mg/dL — AB
Nitrite: POSITIVE — AB
Specific Gravity, Urine: 1.015 (ref 1.005–1.030)
pH: 6 (ref 5.0–8.0)

## 2011-02-06 LAB — URINE CULTURE: Colony Count: NO GROWTH

## 2011-02-06 LAB — URINE MICROSCOPIC-ADD ON

## 2011-02-11 ENCOUNTER — Other Ambulatory Visit: Payer: Self-pay | Admitting: Oncology

## 2011-02-11 ENCOUNTER — Encounter (HOSPITAL_BASED_OUTPATIENT_CLINIC_OR_DEPARTMENT_OTHER): Payer: Medicare Other | Admitting: Oncology

## 2011-02-11 DIAGNOSIS — D649 Anemia, unspecified: Secondary | ICD-10-CM

## 2011-02-11 DIAGNOSIS — D472 Monoclonal gammopathy: Secondary | ICD-10-CM

## 2011-02-11 DIAGNOSIS — M889 Osteitis deformans of unspecified bone: Secondary | ICD-10-CM

## 2011-02-11 LAB — CBC WITH DIFFERENTIAL/PLATELET
Eosinophils Absolute: 0.1 10*3/uL (ref 0.0–0.5)
LYMPH%: 21.4 % (ref 14.0–49.0)
MONO#: 0.5 10*3/uL (ref 0.1–0.9)
NEUT#: 3.2 10*3/uL (ref 1.5–6.5)
Platelets: 135 10*3/uL — ABNORMAL LOW (ref 140–400)
RBC: 3.77 10*6/uL — ABNORMAL LOW (ref 4.20–5.82)
RDW: 13.1 % (ref 11.0–14.6)
WBC: 4.9 10*3/uL (ref 4.0–10.3)
lymph#: 1.1 10*3/uL (ref 0.9–3.3)

## 2011-02-11 LAB — MORPHOLOGY: PLT EST: DECREASED

## 2011-02-13 LAB — COMPREHENSIVE METABOLIC PANEL
ALT: 16 U/L (ref 0–53)
AST: 20 U/L (ref 0–37)
Albumin: 4.2 g/dL (ref 3.5–5.2)
Alkaline Phosphatase: 75 U/L (ref 39–117)
Potassium: 4.2 mEq/L (ref 3.5–5.3)
Sodium: 140 mEq/L (ref 135–145)
Total Bilirubin: 0.5 mg/dL (ref 0.3–1.2)
Total Protein: 7 g/dL (ref 6.0–8.3)

## 2011-02-13 LAB — BASIC METABOLIC PANEL
BUN: 21 mg/dL (ref 6–23)
Calcium: 9.1 mg/dL (ref 8.4–10.5)
Chloride: 113 mEq/L — ABNORMAL HIGH (ref 96–112)
Creatinine, Ser: 1.19 mg/dL (ref 0.4–1.5)
GFR calc Af Amer: >60 mL/min (ref >60)

## 2011-02-13 LAB — IMMUNOFIXATION ELECTROPHORESIS
IgM, Serum: 60 mg/dL (ref 60–263)
Total Protein, Serum Electrophoresis: 7 g/dL (ref 6.0–8.3)

## 2011-02-13 LAB — CBC
MCHC: 35.2 g/dL (ref 30.0–36.0)
MCV: 92.8 fL (ref 78.0–100.0)
Platelets: 107 10*3/uL — ABNORMAL LOW (ref 150–400)
RBC: 3.62 MIL/uL — ABNORMAL LOW (ref 4.22–5.81)
WBC: 5.9 10*3/uL (ref 4.0–10.5)

## 2011-02-13 LAB — KAPPA/LAMBDA LIGHT CHAINS
Kappa free light chain: 7.19 mg/dL — ABNORMAL HIGH (ref 0.33–1.94)
Kappa:Lambda Ratio: 4.89 — ABNORMAL HIGH (ref 0.26–1.65)

## 2011-02-19 ENCOUNTER — Encounter (HOSPITAL_BASED_OUTPATIENT_CLINIC_OR_DEPARTMENT_OTHER): Payer: Medicare Other | Admitting: Oncology

## 2011-02-19 DIAGNOSIS — C8589 Other specified types of non-Hodgkin lymphoma, extranodal and solid organ sites: Secondary | ICD-10-CM

## 2011-02-19 DIAGNOSIS — I2510 Atherosclerotic heart disease of native coronary artery without angina pectoris: Secondary | ICD-10-CM

## 2011-02-19 DIAGNOSIS — N400 Enlarged prostate without lower urinary tract symptoms: Secondary | ICD-10-CM

## 2011-02-19 DIAGNOSIS — D472 Monoclonal gammopathy: Secondary | ICD-10-CM

## 2011-03-19 NOTE — Cardiovascular Report (Signed)
NAME:  Frank Kramer, Frank Kramer NO.:  [id number]   MEDICAL RECORD NO.:  [id number]          PATIENT TYPE:  INP   LOCATION:  3703                         FACILITY:  [facility]   PHYSICIAN:  Thereasa Solo. Little, M.D. DATE OF BIRTH:  30-May-1922   DATE OF PROCEDURE:  DATE OF DISCHARGE:                            CARDIAC CATHETERIZATION   INDICATIONS FOR TEST:  A 77 year old male has an inguinal hernia that  needs surgical repair.  As part of his preop evaluation, he underwent a  nuclear study.  He has dyspnea on exertion.  The nuclear study showed  mild peri-infarction ischemia in the anterior wall.  The patient is  asymptomatic, has never had a heart attack, and is brought in for  outpatient cardiac catheterization.   PROCEDURE:  After obtaining informed consent, the patient was prepped  and draped in the usual sterile fashion exposing the right groin.  Following local anesthetic with 1% Xylocaine, the Seldinger technique  was imported.  A 5-French introducer sheath was placed in the right  femoral artery.  Left and right coronary arteriography, ventriculography  in the RAO projection, and distal aortogram was performed.   COMPLICATIONS:  None.   TOTAL CONTRAST USED:  100 mL.   EQUIPMENT:  A 5-French Judkins configuration catheters.   MEDICATIONS:  The patient has a CONTRAST MEDIA allergy.  He was  premedicated with Benadryl, steroids, and H2 blockers.  Because of back  pain on the table, he was given 12.5 mg of IV fentanyl, and because of  blood pressure elevation was given 20 mg of labetalol.   RESULTS:  1. Hemodynamic monitoring.  His central aortic pressure was 178/68.      His left ventricular pressure was 179/7 with no aortic valve      gradient appreciated at the time of pullback.  2. Ventriculography.  Ventriculography in the RAO projection using 20      mL of contrast at 12 mL per second showed normal LV systolic      function with an ejection fraction excess of 55%.   The left      ventricular end-diastolic pressure was 13.  No mitral regurgitation      was appreciated.  3. Distal aortogram:  Distal aortogram done at the level of the renal      artery showed no evidence of renal artery stenosis.  No abdominal      aortic aneurysm.  No proximal iliac disease.   CORONARY ARTERIOGRAPHY:  On fluoroscopy, calcification was seen in the  distribution of the left main LAD.  1. Left main, normal.  It trifurcated.  2. Optional diagonal.  Small vessel free of disease.  3. Circumflex.  The circumflex was a large 3.5+ mm vessel.  It gave      rise to a very large OM #1 which was free of disease, and the      ongoing circumflex gave rise to OM #2 which was also free of      disease.  4. LAD.  The LAD extended down to the apex of the heart.  The mid  LAD      was a dilated, ectatic area.  Just distal to this was an area of 40-      50% narrowing that was the bifurcation of a diagonal vessel that      was small.  This area was not flow limiting.  It was no more than      40-50% narrowed.  The remainder of the LAD system was without      disease.  5. Right coronary artery.  The right coronary artery was a 3.5-mm      vessel that was free of disease.  The PDA and 2 large posterior      lateral vessels were also free of disease.   CONCLUSION:  1. Dilated, ectatic segment in the midportion of the left anterior      descending with mild-to-moderate narrowing just distal to this.  2. Normal left ventricular systolic function.  3. No evidence for renal artery stenosis.   At this point, I do not feel that intervention is indicated.  I plan to  treat him medically.  His blood pressure is dramatically elevated.  I  added Norvasc 5 mg to his current medications, increased his lisinopril  from 20-40 mg a day, and I plan to keep him overnight and observation.  His social situation is such that he has to give total care to his wife  and I cannot let him go home until his  groin is stable.           ______________________________  Thereasa Solo. Little, M.D.     ABL/MEDQ  D:  02/08/2009  T:  02/09/2009  Job:  [id number]   cc:   Cath Lab.  Bryan Lemma. Manus Gunning, M.D.

## 2011-03-19 NOTE — Discharge Summary (Signed)
NAME:  Frank Kramer, Frank Kramer NO.:  [id number]   MEDICAL RECORD NO.:  [id number]          PATIENT TYPE:  OIB   LOCATION:  3703                         FACILITY:  [facility]   PHYSICIAN:  Thereasa Solo. Little, M.D. DATE OF BIRTH:  1922-05-14   DATE OF ADMISSION:  02/08/2009  DATE OF DISCHARGE:  02/09/2009                               DISCHARGE SUMMARY   DISCHARGE DIAGNOSES:  1. Chest pain with abnormal Myoview as an outpatient, catheterization      this admission showing moderate coronary disease with plans for      medical therapy.  2. Preserved left ventricular function.  3. Treated hypertension.  4. History of non-Hodgkin lymphoma.  5. Thrombocytopenia.  6. Urinary tract infection, currently on antibiotics.   HOSPITAL COURSE:  The patient is an 77 year old male followed by Dr.  Manus Gunning and Dr. Clarene Duke.  He had an outpatient Myoview in March this  year.  He has been relatively asymptomatic.  He had a remote  catheterization in 2001.  There was inferior scar with some peri-infarct  ischemia making that a moderate risk Myoview.  The patient was seen by  Dr. Clarene Duke, February 06, 2009.  On that office visit, he did admit to some  vague chest pain and some dyspnea.  It was decided to proceed with  diagnostic catheterization and further evaluation.  The patient does  have a history of CONTRAST allergy, and he was premedicated with Solu-  Medrol, Pepcid, and Benadryl.  Catheterization done, February 08, 2009, by  Dr. Clarene Duke revealed normal left main, essentially normal circumflex, 40-  50% LAD that was not felt to be flow limiting, and normal RCA.  The  patient's LV function was normal.  He tolerated the procedure well.  Dr.  Clarene Duke feels he can be discharged on February 09, 2009.  We have made some  minor adjustments in his medications prior to discharge.   DISCHARGE MEDICATIONS:  1. Vasotec 20 mg a day.  2. We stopped his Tenormin and put him on Coreg 3.125 mg twice a day.  3. He will  continue Nexium 40 mg, at home he takes twice a day.  4. Ultram 50 mg a day.  5. Xanax 0.25 mg a day.  6. Hydrochlorothiazide 25 mg a day.  7. Cardura 8 mg nightly.  8. Actonel every week.  9. Nabumetone 500 mg twice a day.  10.Cephalexin 250 mg nightly.  11.Fioricet q.6 p.r.n.  12.Vicodin 5/500 q.6 p.r.n.  13.Zyrtec 10 mg a day.  14.Pravachol 20 mg a day has been added as well as aspirin 81 mg a      day.   LABORATORY DATA:  Sodium 143, potassium 3.6, BUN 21, creatinine 1.19.  White count 5.9, hemoglobin 11.8, hematocrit 33.6.  His platelet count  is 107, it was 108 preop.   DISPOSITION:  The patient is discharged in stable condition and will  follow up with Dr. Clarene Duke as noted.      Abelino Derrick, P.A.    ______________________________  Thereasa Solo. Little, M.D.    Lenard Lance  D:  02/09/2009  T:  02/10/2009  Job:  [id number]   cc:   Bryan Lemma. Manus Gunning, M.D.

## 2011-03-19 NOTE — Discharge Summary (Signed)
NAME:  Frank Kramer, Frank Kramer NO.:  [id number]   MEDICAL RECORD NO.:  [id number]          PATIENT TYPE:  INP   LOCATION:  2009                         FACILITY:  [facility]   PHYSICIAN:  Richard A. Alanda Amass, M.D.DATE OF BIRTH:  [birth year]   DATE OF ADMISSION:  08/14/2007  DATE OF DISCHARGE:  08/15/2007                               DISCHARGE SUMMARY   DISCHARGE DIAGNOSES:  1. Palpitations.  2. Abnormal D-dimer with negative pulmonary embolus.  3. Chest pain with negative myocardial infarction.  4. Hypertension.  5. Non-Hodgkin's lymphoma.  6. Paget's disease.  7. Gastroesophageal reflux disease.   CONDITION ON DISCHARGE:  Stable.   PROCEDURES:  None.   DISCHARGE MEDICATIONS:  1. Actonel 30 mg daily.  2. Aleve p.r.n.  3. Cardura 8 mg at bedtime.  4. Fioricet as needed.  5. Hydrochlorothiazide 25 mg daily.  6. Vicodin as needed.  7. Nexium 40 mg twice a day.  8. Tenormin 50 mg twice a day.  9. Ultram 50 mg twice a day.  10.Vasotec 20 mg twice a day.  11.Xanax 0.25 mg twice a day.  12.Relafen 500 mg twice a day.  13.We have added Norvasc 5 mg daily.   ACTIVITY:  Without restrictions.   DIET:  Low sodium, heart healthy.   FOLLOW UP:  Follow up with Dr. Clarene Duke in 2 weeks.  The office will call  you and arrange that.   HISTORY OF PRESENT ILLNESS:  77 year old male came to the emergency room  with history of palpitations.  He has had chest pain the day prior as  well.  History of normal cors in 2001 by cath.  Palpitations were  normal, but today they were prolonged and he could not tolerate them.  He had no arrhythmias on the monitor in the emergency room nor on the  2000 unit per telemetry.  He was admitted overnight to monitor.   PAST MEDICAL HISTORY:  1. History of normal cors in 2001 by a cardiac cath.  2. History of hypertension.  3. Gastroesophageal reflux disease.  4. Non-Hodgkin's lymphoma followed by Dr. Cyndie Chime.  5. BPH.  6. Paget's disease.  7. History of cervical diskectomy x2.  8. History of lumbar fusion.  9. Right hip ORIF in 2006.  10.History of TURP in 2003.  11.Cholecystectomy.   ALLERGIES:  ASPIRIN, RADIOLOGY DYE CAUSES SEIZURES, MACRODANTIN,  CODEINE, AND MORPHINE.   Family history, social history, review of systems, see H&P.   PHYSICAL EXAMINATION ON DISCHARGE:  VITAL SIGNS:  Blood pressure 151/77,  pulse 68, respirations 20, temperature 96.9, oxygen saturation 96% on 2  liters  LUNGS:  Clear to auscultation bilaterally.  HEART:  Regular rate and rhythm with a S1-S2.  EXTREMITIES:  Without edema.   He had no further complaints during the night.   LABORATORY DATA ON ADMISSION:  Hemoglobin 12.2 with follow-up 11.2,  hematocrit 34.8, WBC 4.4, platelets were slightly low at 128, MCV 94.4.  Chemistry:  Sodium 142, potassium 3.7, chloride 111, CO2 25, BUN 13,  creatinine 0.99 to 1.02, and glucose 97.  Coags:  Protime 14.3,  INR of  1.1, PTT 32.  LFTs were normal.  Calcium 8.8, magnesium 2.0.  UA had 3-6  WBCs.  TSH 0.436.  D-dimer was elevated at 0.59.   Chest x-ray:  No active disease.  CT of the chest was done and was  negative for pulmonary emboli on preliminary report.  There is a  question of Paget's disease which he does have.   HOSPITAL COURSE:  The patient was admitted by Dr. Alanda Amass for Dr.  Clarene Duke on August 14, 2007 after presenting to the emergency room with  palpitations that had not resolved and chest discomfort the day prior.  He was hypertensive, blood pressure 188/86.  He was kept overnight.  D-  dimer was found to be elevated, so we sent  him for a CT with premedication, as he is allergic to dye.  He tolerated  the procedure well, and he was negative for pulmonary embolus.  He was  ambulating in the hall prior to discharge and no further complaints and  will be followed as an outpatient.  Dr. Jenne Campus saw him and discharged  him.      Darcella Gasman. Ingold, N.P.      Richard A. Alanda Amass,  M.D.  Electronically Signed    LRI/MEDQ  D:  08/15/2007  T:  08/16/2007  Job:  [id number]   cc:   Gerlene Burdock A. Alanda Amass, M.D.  Thereasa Solo. Little, M.D.  Bryan Lemma. Manus Gunning, M.D.  Genene Churn. Cyndie Chime, M.D.

## 2011-03-22 NOTE — Op Note (Signed)
NAME:  Frank Kramer, HODAPP NO.:  [id number]   MEDICAL RECORD NO.:  [id number]          PATIENT TYPE:  AMB   LOCATION:  DAY                          FACILITY:  [facility]   PHYSICIAN:  Madlyn Frankel. Charlann Boxer, M.D.  DATE OF BIRTH:  July 14, 1922   DATE OF PROCEDURE:  06/18/2005  DATE OF DISCHARGE:                                 OPERATIVE REPORT   PREOPERATIVE DIAGNOSIS:  Delayed union, right proximal femur.   POSTOPERATIVE DIAGNOSIS:  Delayed union, right proximal femur.   PROCEDURE:  Removal of right distal femoral interlock from a previously  placed gamma nail for a subtrochanteric femur fracture.   SURGEON:  Madlyn Frankel. Charlann Boxer, M.D.   ASSISTANT:  None.   ANESTHESIA:  LMA general.   ESTIMATED BLOOD LOSS:  Minimal.   COMPLICATIONS:  None.   INDICATION FOR PROCEDURE:  Frank Kramer is an 77 year old gentleman with a  history of Paget's disease.  He has had increased activity of his Paget's  disease during the year 2005 and was treated with Actonel and had increasing  right thigh pain.  The concern was increased bone turnover versus fracture.  Initial evaluation revealed no evidence of fracture or subacute fracture.  Nonetheless, early in the year of 2006, he sustained an atraumatic fracture  of the proximal femur in the subtrochanteric region.  He was taken to the  operating room and had an intramedullary nail placed through the trochanter,  locked proximally and distally.  He continues to have some pain over the  anterior thigh proximally into the groin and into the anterior proximal  thigh.  It has been 4-1/2 months since the initial fixation.  Radiographically, he had evidence of some healing but clinically he still  had pain with weightbearing.  Based on this, the decision was made  clinically to remove the distal interlock to allow for further compression  across the fracture site and to induce healing.  No bone grafting was  indicated.   Consent was obtained for removal of  hardware.  Very little risk was involved  other than that of anesthesia.  Further risks include further delayed union  to nonunion but the fracture would not heal.   I also reviewed with Frank Kramer his degenerative changes present in this  right foot and that producing some of this anterior groin pain.   Consent was obtained.   PROCEDURE IN DETAIL:  The patient was brought to the operative theater.  With adequate anesthesia and preoperative antibiotics of 1 g Ancef were  administered, the patient was positioned supine.  A bump was placed  underneath the right hip.  The right lower extremity was prepped and draped  from the ankle to the proximal thigh using the extremity drape.  Previous  incision was incised.  Sharp dissection was carried down through the  iliotibial band laterally.  Using a tonsil clamp, the previously placed  distal interlock was identified.  Key elevator was used to remove any  overlying fibrinous scar tissue.  The Stryker screw driver was then placed  on the head and the screw removed  in continuity without complication.  The  wound was then irrigated with a normal saline solution.  The subcutaneous  layer was reapproximated with 2-0 Vicryl and a 3-0 running Monocryl in the  skin.  The wound was then cleaned, dried, and dressed sterilely with Steri-  Strips, Adaptic, dressing sponges, tape.  The patient was extubated from LMA  and brought to the recovery room in stable condition.   He will be partial weightbearing and return to see me in 2 weeks for wound  evaluation.  I will follow him up clinically for healing.      Madlyn Frankel Charlann Boxer, M.D.  Electronically Signed     MDO/MEDQ  D:  06/18/2005  T:  06/18/2005  Job:  [id number]

## 2011-03-22 NOTE — H&P (Signed)
NAME:  Frank Kramer, JUMP NO.:  [id number]   MEDICAL RECORD NO.:  [id number]          PATIENT TYPE:  INP   LOCATION:  1436                         FACILITY:  [facility]   PHYSICIAN:  Bertram Millard. Dahlstedt, M.D.DATE OF BIRTH:  [birth year]   DATE OF ADMISSION:  09/15/2008  DATE OF DISCHARGE:  09/19/2008                              HISTORY & PHYSICAL   REASON FOR ADMISSION:  Gross hematuria.   BRIEF HISTORY:  An 77 year old male who is admitted through my office  for gross hematuria and clot retention.  We have been dealing with this  for several days - this has been getting worse, and has been quite  symptomatic with the patient having bladder spasms.  He has had no fever  or chills.  He has had no nausea, vomiting.  There is prior history of  prostatic issues and he is years out from a TURP and prior cauterization  of bleeding.   PAST MEDICAL HISTORY:  1. Non-Hodgkin's lymphoma.  2. Cholecystectomy in '68.  3. Prostate surgery in '86 and 2003.  4. Cervical surgery in '93.  5. Removal of skin cancers.  6. Hypertension.  7. Cardiomyopathy and murmur.  8. Paget's disease of bone.  9. Arthritis.   MEDICATIONS:  1. Vasotek 20 mg b.i.d.  2. Tenormin 50 mg b.i.d.  3. Nexium 40 mg b.i.d.  4. Ultram 50 mg b.i.d. p.r.n.  5. Fioricet p.r.n.  6. Xanax 0.25 mg p.o. b.i.d. p.r.n.  7. HCTZ 25 mg daily.  8. Cardura 8 mg nightly.  9. Actonel 30 mg once a week.  10.Vicodin p.r.n.  11.Aleve p.r.n.  12.Norvasc 5 mg daily.   ALLERGIES:  ASPIRIN, MACRODANTIN, CODEINE, MORPHINE AND X-RAY DYES.   The patient is married and has children.  He is retired.  He denies  tobacco or alcohol use.   FAMILY HISTORY:  Noncontributory.   REVIEW OF SYSTEMS:  Significant for bladder pain, gross hematuria.   EXAM:  Revealed a pleasant elderly male.  Somewhat frail.  NECK:  Supple, no adenopathy noted in the cervical or supraclavicular  regions.  LUNGS:  Clear.  HEART:  Normal rate and  rhythm.  ABDOMEN:  Scaphoid, soft, nondistended, nontender.  No mass, no  organomegaly.  Bladder is somewhat tender.  EXTERNAL GENITALIA:  Normal with the exception of testicular atrophy.  Normal anal sphincter tone.  __________ 2+ symmetrical, non-nodular,  nontender.  No rectal mass.  No peripheral edema.   LABORATORIES ON ADMISSION:  Hematocrit low at 30.1%, BMET normal.   IMPRESSION:  Clot retention/hematuria.   PLAN:  Admit for irrigation, possible prostaglandin irrigation.      Bertram Millard. Dahlstedt, M.D.  Electronically Signed     SMD/MEDQ  D:  11/01/2008  T:  11/01/2008  Job:  [id number]

## 2011-06-03 ENCOUNTER — Emergency Department (HOSPITAL_COMMUNITY): Payer: Medicare Other

## 2011-06-03 ENCOUNTER — Emergency Department (HOSPITAL_COMMUNITY)
Admission: EM | Admit: 2011-06-03 | Discharge: 2011-06-03 | Disposition: A | Discharge status: Home / Self Care | Payer: Medicare Other | Attending: Emergency Medicine | Admitting: Emergency Medicine

## 2011-06-03 DIAGNOSIS — M129 Arthropathy, unspecified: Secondary | ICD-10-CM | POA: Insufficient documentation

## 2011-06-03 DIAGNOSIS — M542 Cervicalgia: Secondary | ICD-10-CM | POA: Insufficient documentation

## 2011-06-03 DIAGNOSIS — Z7982 Long term (current) use of aspirin: Secondary | ICD-10-CM | POA: Insufficient documentation

## 2011-06-03 DIAGNOSIS — Z79899 Other long term (current) drug therapy: Secondary | ICD-10-CM | POA: Insufficient documentation

## 2011-06-03 DIAGNOSIS — I10 Essential (primary) hypertension: Secondary | ICD-10-CM | POA: Insufficient documentation

## 2011-07-02 ENCOUNTER — Other Ambulatory Visit: Payer: Self-pay | Admitting: Oncology

## 2011-07-02 DIAGNOSIS — C8590 Non-Hodgkin lymphoma, unspecified, unspecified site: Secondary | ICD-10-CM

## 2011-07-05 ENCOUNTER — Ambulatory Visit (HOSPITAL_COMMUNITY)
Admission: RE | Admit: 2011-07-05 | Discharge: 2011-07-05 | Disposition: A | Discharge status: Home / Self Care | Payer: Medicare Other | Source: Ambulatory Visit | Attending: Oncology | Admitting: Oncology

## 2011-07-05 DIAGNOSIS — K7689 Other specified diseases of liver: Secondary | ICD-10-CM | POA: Insufficient documentation

## 2011-07-05 DIAGNOSIS — Q619 Cystic kidney disease, unspecified: Secondary | ICD-10-CM | POA: Insufficient documentation

## 2011-07-05 DIAGNOSIS — C8590 Non-Hodgkin lymphoma, unspecified, unspecified site: Secondary | ICD-10-CM

## 2011-07-05 DIAGNOSIS — C8589 Other specified types of non-Hodgkin lymphoma, extranodal and solid organ sites: Secondary | ICD-10-CM | POA: Insufficient documentation

## 2011-08-06 LAB — CBC
HCT: 29.3 — ABNORMAL LOW
Hemoglobin: 10.4 — ABNORMAL LOW
MCV: 98.1
MCV: 98.4
Platelets: 133 — ABNORMAL LOW
RBC: 2.98 — ABNORMAL LOW
RBC: 3.07 — ABNORMAL LOW
WBC: 4.8
WBC: 5.8

## 2011-08-06 LAB — BASIC METABOLIC PANEL
Chloride: 107
Creatinine, Ser: 1.47
GFR calc Af Amer: 55 — ABNORMAL LOW
Sodium: 140

## 2011-08-13 ENCOUNTER — Other Ambulatory Visit: Payer: Self-pay | Admitting: Oncology

## 2011-08-13 ENCOUNTER — Encounter (HOSPITAL_BASED_OUTPATIENT_CLINIC_OR_DEPARTMENT_OTHER): Payer: Medicare Other | Admitting: Oncology

## 2011-08-13 DIAGNOSIS — D472 Monoclonal gammopathy: Secondary | ICD-10-CM

## 2011-08-13 DIAGNOSIS — C8589 Other specified types of non-Hodgkin lymphoma, extranodal and solid organ sites: Secondary | ICD-10-CM

## 2011-08-13 LAB — CBC WITH DIFFERENTIAL/PLATELET
Eosinophils Absolute: 0.2 10*3/uL (ref 0.0–0.5)
HGB: 11.8 g/dL — ABNORMAL LOW (ref 13.0–17.1)
MONO#: 0.5 10*3/uL (ref 0.1–0.9)
NEUT#: 3.2 10*3/uL (ref 1.5–6.5)
RBC: 3.38 10*6/uL — ABNORMAL LOW (ref 4.20–5.82)
RDW: 14 % (ref 11.0–14.6)
WBC: 4.7 10*3/uL (ref 4.0–10.3)

## 2011-08-14 LAB — COMPREHENSIVE METABOLIC PANEL
Albumin: 4.2 g/dL (ref 3.5–5.2)
CO2: 26 mEq/L (ref 19–32)
Calcium: 9 mg/dL (ref 8.4–10.5)
Chloride: 109 mEq/L (ref 96–112)
Glucose, Bld: 93 mg/dL (ref 70–99)
Potassium: 3.9 mEq/L (ref 3.5–5.3)
Sodium: 142 mEq/L (ref 135–145)
Total Protein: 6.8 g/dL (ref 6.0–8.3)

## 2011-08-14 LAB — KAPPA/LAMBDA LIGHT CHAINS: Kappa:Lambda Ratio: 1.28 (ref 0.26–1.65)

## 2011-08-14 LAB — LACTATE DEHYDROGENASE: LDH: 203 U/L (ref 94–250)

## 2011-08-15 LAB — I-STAT 8, (EC8 V) (CONVERTED LAB)
Glucose, Bld: 115 — ABNORMAL HIGH
Hemoglobin: 11.9 — ABNORMAL LOW
Potassium: 3.8
Sodium: 141
TCO2: 25

## 2011-08-15 LAB — APTT: aPTT: 32

## 2011-08-15 LAB — URINALYSIS, ROUTINE W REFLEX MICROSCOPIC
Bilirubin Urine: NEGATIVE
Ketones, ur: NEGATIVE
Protein, ur: NEGATIVE
Urobilinogen, UA: 0.2

## 2011-08-15 LAB — BASIC METABOLIC PANEL
BUN: 12
Chloride: 107
Glucose, Bld: 77
Potassium: 3.6

## 2011-08-15 LAB — COMPREHENSIVE METABOLIC PANEL
Alkaline Phosphatase: 59
BUN: 13
Chloride: 111
GFR calc non Af Amer: >60
Glucose, Bld: 97
Potassium: 3.7
Total Bilirubin: 0.9
Total Protein: 6.3

## 2011-08-15 LAB — HEPATIC FUNCTION PANEL
ALT: 12
Indirect Bilirubin: 0.6
Total Protein: 6.3

## 2011-08-15 LAB — TROPONIN I: Troponin I: 0.03

## 2011-08-15 LAB — CK TOTAL AND CKMB (NOT AT ARMC): Total CK: 60

## 2011-08-15 LAB — DIFFERENTIAL
Basophils Absolute: 0
Basophils Relative: 0
Eosinophils Absolute: 0
Eosinophils Relative: 1
Lymphocytes Relative: 14

## 2011-08-15 LAB — CARDIAC PANEL(CRET KIN+CKTOT+MB+TROPI)
CK, MB: 2.2
CK, MB: 2.5
Relative Index: INVALID
Total CK: 90
Total CK: 94
Troponin I: 0.02

## 2011-08-15 LAB — CBC
HCT: 32.3 — ABNORMAL LOW
HCT: 34.8 — ABNORMAL LOW
Hemoglobin: 11.2 — ABNORMAL LOW
Platelets: 128 — ABNORMAL LOW
Platelets: 129 — ABNORMAL LOW
RDW: 13
WBC: 4.6

## 2011-08-15 LAB — POCT I-STAT CREATININE: Operator id: 198171

## 2011-08-15 LAB — POCT CARDIAC MARKERS
CKMB, poc: <1 — ABNORMAL LOW
Myoglobin, poc: 68.4
Operator id: 198171
Operator id: 198171
Troponin i, poc: <0.05

## 2011-08-15 LAB — MAGNESIUM: Magnesium: 2

## 2011-08-15 LAB — PROTIME-INR
INR: 1.1
Prothrombin Time: 14.3

## 2011-08-20 ENCOUNTER — Encounter (HOSPITAL_BASED_OUTPATIENT_CLINIC_OR_DEPARTMENT_OTHER): Payer: Medicare Other | Admitting: Oncology

## 2011-08-20 ENCOUNTER — Other Ambulatory Visit: Payer: Self-pay | Admitting: Oncology

## 2011-08-20 DIAGNOSIS — D61818 Other pancytopenia: Secondary | ICD-10-CM

## 2011-08-20 DIAGNOSIS — N400 Enlarged prostate without lower urinary tract symptoms: Secondary | ICD-10-CM

## 2011-08-20 DIAGNOSIS — D472 Monoclonal gammopathy: Secondary | ICD-10-CM

## 2011-08-20 DIAGNOSIS — C8589 Other specified types of non-Hodgkin lymphoma, extranodal and solid organ sites: Secondary | ICD-10-CM

## 2011-08-22 LAB — UIFE/LIGHT CHAINS/TP QN, 24-HR UR
Alpha 2, Urine: DETECTED — AB
Free Kappa Lt Chains,Ur: 16.2 mg/dL — ABNORMAL HIGH (ref 0.14–2.42)
Free Kappa/Lambda Ratio: 41.54 ratio — ABNORMAL HIGH (ref 2.04–10.37)
Free Lt Chn Excr Rate: 210.6 mg/d
Gamma Globulin, Urine: DETECTED — AB
Total Protein, Urine: 20.7 mg/dL

## 2011-11-21 ENCOUNTER — Emergency Department (HOSPITAL_COMMUNITY): Payer: Medicare Other

## 2011-11-21 ENCOUNTER — Encounter (HOSPITAL_COMMUNITY): Payer: Self-pay | Admitting: Emergency Medicine

## 2011-11-21 ENCOUNTER — Inpatient Hospital Stay (HOSPITAL_COMMUNITY)
Admission: EM | Admit: 2011-11-21 | Discharge: 2011-11-27 | DRG: 378 | Disposition: A | Discharge status: Home / Self Care | Payer: Medicare Other | Attending: Family Medicine | Admitting: Family Medicine

## 2011-11-21 DIAGNOSIS — F411 Generalized anxiety disorder: Secondary | ICD-10-CM | POA: Diagnosis present

## 2011-11-21 DIAGNOSIS — K922 Gastrointestinal hemorrhage, unspecified: Secondary | ICD-10-CM

## 2011-11-21 DIAGNOSIS — I10 Essential (primary) hypertension: Secondary | ICD-10-CM

## 2011-11-21 DIAGNOSIS — K219 Gastro-esophageal reflux disease without esophagitis: Secondary | ICD-10-CM | POA: Diagnosis present

## 2011-11-21 DIAGNOSIS — Z87898 Personal history of other specified conditions: Secondary | ICD-10-CM

## 2011-11-21 DIAGNOSIS — K625 Hemorrhage of anus and rectum: Secondary | ICD-10-CM

## 2011-11-21 DIAGNOSIS — M1990 Unspecified osteoarthritis, unspecified site: Secondary | ICD-10-CM | POA: Diagnosis present

## 2011-11-21 DIAGNOSIS — D696 Thrombocytopenia, unspecified: Secondary | ICD-10-CM | POA: Diagnosis present

## 2011-11-21 DIAGNOSIS — K921 Melena: Principal | ICD-10-CM | POA: Diagnosis present

## 2011-11-21 DIAGNOSIS — D630 Anemia in neoplastic disease: Secondary | ICD-10-CM | POA: Diagnosis present

## 2011-11-21 DIAGNOSIS — F419 Anxiety disorder, unspecified: Secondary | ICD-10-CM | POA: Diagnosis present

## 2011-11-21 DIAGNOSIS — Z860100 Personal history of colon polyps, unspecified: Secondary | ICD-10-CM

## 2011-11-21 DIAGNOSIS — D62 Acute posthemorrhagic anemia: Secondary | ICD-10-CM | POA: Diagnosis present

## 2011-11-21 DIAGNOSIS — N329 Bladder disorder, unspecified: Secondary | ICD-10-CM | POA: Diagnosis present

## 2011-11-21 HISTORY — DX: Unspecified malignant neoplasm of skin, unspecified: C44.90

## 2011-11-21 HISTORY — DX: Essential (primary) hypertension: I10

## 2011-11-21 HISTORY — DX: Unspecified osteoarthritis, unspecified site: M19.90

## 2011-11-21 HISTORY — DX: Non-Hodgkin lymphoma, unspecified, unspecified site: C85.90

## 2011-11-21 HISTORY — DX: Panic disorder (episodic paroxysmal anxiety): F41.0

## 2011-11-21 HISTORY — DX: Gastro-esophageal reflux disease without esophagitis: K21.9

## 2011-11-21 HISTORY — DX: Pure hypercholesterolemia, unspecified: E78.00

## 2011-11-21 LAB — CBC
HCT: 32.8 % — ABNORMAL LOW (ref 39.0–52.0)
HCT: 28.3 % — ABNORMAL LOW (ref 39.0–52.0)
Hemoglobin: 11.4 g/dL — ABNORMAL LOW (ref 13.0–17.0)
MCH: 32.1 pg (ref 26.0–34.0)
MCHC: 34.8 g/dL (ref 30.0–36.0)
MCHC: 36 g/dL (ref 30.0–36.0)
MCV: 92.4 fL (ref 78.0–100.0)
RDW: 14.5 % (ref 11.5–15.5)

## 2011-11-21 LAB — TYPE AND SCREEN
ABO/RH(D): O POS
Antibody Screen: NEGATIVE
Unit division: 0

## 2011-11-21 LAB — COMPREHENSIVE METABOLIC PANEL
BUN: 18 mg/dL (ref 6–23)
CO2: 24 mEq/L (ref 19–32)
Calcium: 9.2 mg/dL (ref 8.4–10.5)
Creatinine, Ser: 1.08 mg/dL (ref 0.50–1.35)
GFR calc Af Amer: 69 mL/min — ABNORMAL LOW (ref >90)
GFR calc non Af Amer: 60 mL/min — ABNORMAL LOW (ref >90)
Glucose, Bld: 121 mg/dL — ABNORMAL HIGH (ref 70–99)
Sodium: 139 mEq/L (ref 135–145)
Total Protein: 6.9 g/dL (ref 6.0–8.3)

## 2011-11-21 LAB — PROTIME-INR
INR: 1.14 (ref 0.00–1.49)
INR: 1.25 (ref 0.00–1.49)
Prothrombin Time: 14.8 seconds (ref 11.6–15.2)

## 2011-11-21 LAB — MAGNESIUM: Magnesium: 2.1 mg/dL (ref 1.5–2.5)

## 2011-11-21 LAB — ABO/RH: ABO/RH(D): O POS

## 2011-11-21 LAB — PREPARE RBC (CROSSMATCH)

## 2011-11-21 LAB — PHOSPHORUS: Phosphorus: 1.8 mg/dL — ABNORMAL LOW (ref 2.3–4.6)

## 2011-11-21 MED ORDER — SODIUM CHLORIDE 0.9 % IV BOLUS (SEPSIS)
500.0000 mL | Freq: Once | INTRAVENOUS | Status: AC
  Administered 2011-11-21: 500 mL via INTRAVENOUS

## 2011-11-21 MED ORDER — ONDANSETRON HCL 4 MG/2ML IJ SOLN
4.0000 mg | Freq: Four times a day (QID) | INTRAMUSCULAR | Status: DC | PRN
  Administered 2011-11-24: 4 mg via INTRAVENOUS
  Filled 2011-11-21: qty 2

## 2011-11-21 MED ORDER — LORAZEPAM 2 MG/ML PO CONC: 0.5000 mg | Freq: Every day | ORAL | Status: DC

## 2011-11-21 MED ORDER — TECHNETIUM TC 99M-LABELED RED BLOOD CELLS IV KIT
25.0000 | PACK | Freq: Once | INTRAVENOUS | Status: AC | PRN
  Administered 2011-11-21: 25 via INTRAVENOUS

## 2011-11-21 MED ORDER — ACETAMINOPHEN 325 MG PO TABS
650.0000 mg | ORAL_TABLET | Freq: Four times a day (QID) | ORAL | Status: DC | PRN
  Administered 2011-11-22 – 2011-11-26 (×5): 650 mg via ORAL
  Filled 2011-11-21 (×5): qty 2

## 2011-11-21 MED ORDER — ONDANSETRON HCL 4 MG PO TABS: 4.0000 mg | ORAL_TABLET | Freq: Four times a day (QID) | ORAL | Status: DC | PRN

## 2011-11-21 MED ORDER — LORAZEPAM 0.5 MG PO TABS: 0.5000 mg | ORAL_TABLET | Freq: Every day | ORAL | Status: DC

## 2011-11-21 MED ORDER — HYDRALAZINE HCL 20 MG/ML IJ SOLN
5.0000 mg | Freq: Three times a day (TID) | INTRAMUSCULAR | Status: DC | PRN
  Filled 2011-11-21: qty 1
  Filled 2011-11-21: qty 0.25

## 2011-11-21 MED ORDER — SODIUM CHLORIDE 0.9 % IV SOLN
8.0000 mg/h | INTRAVENOUS | Status: DC
  Administered 2011-11-21 – 2011-11-26 (×9): 8 mg/h via INTRAVENOUS
  Filled 2011-11-21 (×25): qty 80

## 2011-11-21 MED ORDER — ACETAMINOPHEN 650 MG RE SUPP: 650.0000 mg | Freq: Four times a day (QID) | RECTAL | Status: DC | PRN

## 2011-11-21 MED ORDER — KCL IN DEXTROSE-NACL 20-5-0.9 MEQ/L-%-% IV SOLN
INTRAVENOUS | Status: AC
  Administered 2011-11-21 – 2011-11-23 (×3): via INTRAVENOUS
  Filled 2011-11-21 (×5): qty 1000

## 2011-11-21 MED ORDER — PANTOPRAZOLE SODIUM 40 MG IV SOLR
40.0000 mg | Freq: Once | INTRAVENOUS | Status: AC
  Administered 2011-11-21: 40 mg via INTRAVENOUS
  Filled 2011-11-21: qty 40

## 2011-11-21 MED ORDER — ALPRAZOLAM 0.5 MG PO TABS
0.5000 mg | ORAL_TABLET | Freq: Every day | ORAL | Status: DC
  Administered 2011-11-21: 0.5 mg via ORAL
  Filled 2011-11-21: qty 1

## 2011-11-21 NOTE — ED Notes (Signed)
No hx of gi bleed. Bright red bm noted by ems

## 2011-11-21 NOTE — H&P (Signed)
PCP:   [facility] Lighter.   Chief Complaint:  Rectal bleeding since this morning.  HPI: Frank Kramer is a pleasant gentleman on ASA/naproxen/hx of polyps, last colonoscopy about 6 years ago, who comes in with painless rectal bleeding which he first noticed when he went to use the bathroom this morning. It was bright red blood, but patient says he also noticed some dark colored stool. He denies dizziness or palpitations. Says he had some "prostate bleeding" about 2 weeks ago, which stopped after getting some medication from his urologist. He has lost about 25 lbs in the last 2 months and has recently started regaining back, but attributes the weight loss to his wife getting hospitalized in November. He has been taking ASA/naproxen for arthritis. His hemoglobin was 11.3 today, baseline 11.38. BP 11 systolic, HR 101.  Review of Systems:  Negative except as in the hpi.  Past Medical History: Past Medical History  Diagnosis Date  . Skin cancer   . Arthritis   . Hypertension   . High cholesterol   . GERD (gastroesophageal reflux disease)   . Panic     panic disorder  . Non Hodgkin's lymphoma    Past Surgical History  Procedure Date  . Back surgery   . Neck surgery     c2  . Skin biopsy   . Hernia repair   . Prostatectomy     Medications: Prior to Admission medications   Medication Sig Start Date End Date Taking? Authorizing Provider  ACETAMINOPHEN-BUTALBITAL 50-325 MG TABS Take 1 tablet by mouth as needed. For headaches.   Yes Historical Provider, MD  ALPRAZolam Prudy Feeler) 0.5 MG tablet Take 0.5 mg by mouth at bedtime.   Yes Historical Provider, MD  amLODipine (NORVASC) 10 MG tablet Take 10 mg by mouth at bedtime.   Yes Historical Provider, MD  aspirin EC 81 MG tablet Take 81 mg by mouth at bedtime.   Yes Historical Provider, MD  carvedilol (COREG) 12.5 MG tablet Take 12.5 mg by mouth 2 (two) times daily.   Yes Historical Provider, MD  enalapril (VASOTEC) 20 MG tablet Take 20 mg by mouth 2  (two) times daily.   Yes Historical Provider, MD  nabumetone (RELAFEN) 750 MG tablet Take 750 mg by mouth 2 (two) times daily.   Yes Historical Provider, MD  naproxen (NAPROSYN) 500 MG tablet Take 500 mg by mouth 2 (two) times daily with a meal.   Yes Historical Provider, MD  omeprazole (PRILOSEC) 40 MG capsule Take 40 mg by mouth daily before breakfast.   Yes Historical Provider, MD  traMADol (ULTRAM) 50 MG tablet Take 50 mg by mouth 2 (two) times daily. For pain.   Yes Historical Provider, MD    Allergies:   Allergies  Allergen Reactions  . Codeine Other (See Comments)    Patient cannot recall reaction  . Iohexol      Desc: HAS SEIZURES WITH X-RAY DYE-GIVEN 120 MG SOLU0MEDROL, 25 MG BENADRYL, AND 25 MG PEPCID 1 HOUR PRIOR TO EXAM AND HAD NO PROBLEMS-ARS-08/15/07   . Macrodantin Other (See Comments)    Patient cannot recall reaction  . Morphine And Related Itching    Social History:  does not have a smoking history on file. He does not have any smokeless tobacco history on file. His alcohol and drug histories not on file.   Family History: Noncontributory.  Physical Exam: Filed Vitals:   11/21/11 0945 11/21/11 1125  BP: 159/62 117/59  Pulse: 95 101  Temp: 98.3 F (  36.8 C)   TempSrc: Oral   Resp: 16   SpO2: 98%    Anxious, not in distress. HEENT- no jvd. No carotid bruits. CVS- S1S2.RRR. No murmurs. Abdomen- soft, non tender. No palpable masses or organomegaly. +BS. CNS- some memory loss, otherwise grossly intact. Extremities- no pedal edema. No masses.   Labs on Admission:   Basename 11/21/11 1000  NA 139  K 3.6  CL 106  CO2 24  GLUCOSE 121*  BUN 18  CREATININE 1.08  CALCIUM 9.2  MG --  PHOS --    Basename 11/21/11 1000  AST 13  ALT 12  ALKPHOS 86  BILITOT 0.3  PROT 6.9  ALBUMIN 3.7   No results found for this basename: LIPASE:2,AMYLASE:2 in the last 72 hours  Basename 11/21/11 1000  WBC 4.8  NEUTROABS --  HGB 11.4*  HCT 32.8*  MCV 92.4  PLT  152   No results found for this basename: CKTOTAL:3,CKMB:3,CKMBINDEX:3,TROPONINI:3 in the last 72 hours No results found for this basename: TSH,T4TOTAL,FREET3,T3FREE,THYROIDAB in the last 72 hours No results found for this basename: VITAMINB12:2,FOLATE:2,FERRITIN:2,TIBC:2,IRON:2,RETICCTPCT:2 in the last 72 hours  Radiological Exams on Admission: No results found.  Assessment Acute rectal bleeding without hemodynamic compromise in pleasant elderly male on NSAIDS. Upper versus lower gi loss(PUD versus ischemic colitis versus diverticular bleed) Plan .Rectal bleeding- NO/ivf/ppi/serial h/h, transfuse as necessary. Avoid nsaids. Obtain ct abdomen/pelvis- assess for ischemic colitis. Gi consulted by the ED. Marland KitchenHTN (hypertension)- controlled. Hold long acting meds till gi bleed addressed. Hydralazine prn. Marland KitchenGERD (gastroesophageal reflux disease)- on ppi .Anxiety disorder- ativan prn at night. .Arthritis- analgesics as needed. .Anemia- acute on chronic, monitor. Transfuse prbc as needed. Condition guarded.  Frank Kramer [phone] 11/21/2011, 12:32 PM

## 2011-11-21 NOTE — ED Notes (Signed)
Son-in-law (wife is POA) phone # [phone] (cell) notify of any changes.

## 2011-11-21 NOTE — ED Notes (Signed)
Pt back from nuclear medicine.

## 2011-11-21 NOTE — ED Notes (Signed)
Spoke to [place] in [place] to let her know he finished his contrast and is now ready for scan.

## 2011-11-21 NOTE — ED Notes (Signed)
[id number]<AV> Expected date:<BR> Expected time:<BR> Means of arrival:<BR> Comments:<BR> Ems/ gi bleed

## 2011-11-21 NOTE — Consult Note (Signed)
Subjective:   HPI  The patient is an 78 year old male who presented to the emergency room with complaints of lower gastrointestinal bleeding which started this morning. He has a history of diverticulosis of the sigmoid colon based on colonoscopy done in 2008. He denies abdominal pain and denies hematemesis. He describes the bleeding as bright red blood with clots, and he continues to experience bleeding while here in the emergency room. He is not feeling weaker lightheaded.  Review of Systems He denies chest pain or shortness of breath, he denies vomiting or hematemesis. He has no complaints of upper abdominal pain.  Past Medical History  Diagnosis Date  . Skin cancer   . Arthritis   . Hypertension   . High cholesterol   . GERD (gastroesophageal reflux disease)   . Panic     panic disorder  . Non Hodgkin's lymphoma    Past Surgical History  Procedure Date  . Back surgery   . Neck surgery     c2  . Skin biopsy   . Hernia repair   . Prostatectomy    History   Social History  . Marital Status: Married    Spouse Name: N/A    Number of Children: N/A  . Years of Education: N/A   Occupational History  . Not on file.   Social History Main Topics  . Smoking status: Former Smoker    Quit date: 11/20/1968  . Smokeless tobacco: Never Used  . Alcohol Use: No  . Drug Use: No  . Sexually Active: No   Other Topics Concern  . Not on file   Social History Narrative  . No narrative on file   family history is not on file. Current facility-administered medications:dextrose 5 % and 0.9 % NaCl with KCl 20 mEq/L infusion, , Intravenous, Continuous, Simbiso Ranga, MD;  LORazepam (ATIVAN) tablet 0.5 mg, 0.5 mg, Oral, QHS, Simbiso Ranga, MD;  pantoprazole (PROTONIX) 80 mg in sodium chloride 0.9 % 250 mL infusion, 8 mg/hr, Intravenous, Continuous, Simbiso Ranga, MD, Last Rate: 25 mL/hr at 11/21/11 1530, 8 mg/hr at 11/21/11 1530 pantoprazole (PROTONIX) injection 40 mg, 40 mg, Intravenous,  Once, Suzi Roots, MD, 40 mg at 11/21/11 1029;  sodium chloride 0.9 % bolus 500 mL, 500 mL, Intravenous, Once, Suzi Roots, MD, 500 mL at 11/21/11 1028;  DISCONTD: LORazepam (ATIVAN) 2 MG/ML concentrated solution 0.5 mg, 0.5 mg, Oral, QHS, Simbiso Ranga, MD Current outpatient prescriptions:ACETAMINOPHEN-BUTALBITAL 50-325 MG TABS, Take 1 tablet by mouth as needed. For headaches., Disp: , Rfl: ;  ALPRAZolam (XANAX) 0.5 MG tablet, Take 0.5 mg by mouth at bedtime., Disp: , Rfl: ;  amLODipine (NORVASC) 10 MG tablet, Take 10 mg by mouth at bedtime., Disp: , Rfl: ;  aspirin EC 81 MG tablet, Take 81 mg by mouth at bedtime., Disp: , Rfl:  carvedilol (COREG) 12.5 MG tablet, Take 12.5 mg by mouth 2 (two) times daily., Disp: , Rfl: ;  enalapril (VASOTEC) 20 MG tablet, Take 20 mg by mouth 2 (two) times daily., Disp: , Rfl: ;  nabumetone (RELAFEN) 750 MG tablet, Take 750 mg by mouth 2 (two) times daily., Disp: , Rfl: ;  naproxen (NAPROSYN) 500 MG tablet, Take 500 mg by mouth 2 (two) times daily with a meal., Disp: , Rfl:  omeprazole (PRILOSEC) 40 MG capsule, Take 40 mg by mouth daily before breakfast., Disp: , Rfl: ;  traMADol (ULTRAM) 50 MG tablet, Take 50 mg by mouth 2 (two) times daily. For pain., Disp: ,  Rfl:  Allergies  Allergen Reactions  . Codeine Other (See Comments)    Patient cannot recall reaction  . Iohexol      Desc: HAS SEIZURES WITH X-RAY DYE-GIVEN 120 MG SOLU0MEDROL, 25 MG BENADRYL, AND 25 MG PEPCID 1 HOUR PRIOR TO EXAM AND HAD NO PROBLEMS-ARS-08/15/07   . Macrodantin Other (See Comments)    Patient cannot recall reaction  . Morphine And Related Itching     Objective:     BP 127/65  Pulse 87  Temp(Src) 98 F (36.7 C) (Oral)  Resp 20  SpO2 95%  In general he is alert and oriented  He does not appear in acute distress at this time that is sitting on a bedpan and he is passing some blood.  Heart regular rhythm no murmurs  Lungs clear  Abdomen soft nontender.  Laboratory No  components found with this basename: d1      Assessment:     Lower GI bleed most consistent with a diverticular bleed.      Plan:     I would recommend obtaining a nuclear medicine GI bleed scan and if positive consider angiography to stop the bleeding if it persists. Follow serial H&H is. Transfuse blood as needed.     Component Value Date/Time   WBC 4.8 11/21/2011 1000   WBC 4.7 08/13/2011 1003   HGB 11.4* 11/21/2011 1000   HGB 11.8* 08/13/2011 1003   HCT 32.8* 11/21/2011 1000   HCT 33.5* 08/13/2011 1003   PLT 152 11/21/2011 1000   PLT 149 08/13/2011 1003   ALT 12 11/21/2011 1000   AST 13 11/21/2011 1000   NA 139 11/21/2011 1000   K 3.6 11/21/2011 1000   CL 106 11/21/2011 1000   CREATININE 1.08 11/21/2011 1000   BUN 18 11/21/2011 1000   CO2 24 11/21/2011 1000   CALCIUM 9.2 11/21/2011 1000   PHOS 1.8* 11/21/2011 1000   ALKPHOS 86 11/21/2011 1000

## 2011-11-21 NOTE — ED Notes (Signed)
Pt transported to nuclear medicine per the day RN

## 2011-11-21 NOTE — ED Provider Notes (Addendum)
History     CSN: [id number]  Arrival date & time 11/21/11  4098   First MD Initiated Contact with Patient 11/21/11 [phone]      Chief Complaint  Patient presents with  . GI Bleeding    bright red per rectum witnessed by EMS    (Consider location/radiation/quality/duration/timing/severity/associated sxs/prior treatment) The history is provided by the patient.  pt c/o rectal bleeding onset this morning, 2 episodes. No pain. Denies exacerbating or alleviating factors.  States stools darkb/black mixed with red blood. No hx same. No abd pain. No nv. Had been having normal bms previously. Denies hx diverticula, avm, pud, or other cause gi bleeding. No hx bleeding hemorrhoids. No faintness or dizziness. No cp or sob. Takes baby asa a aday. Denies nsaid use. No coumadin use. Past Medical History  Diagnosis Date  . Skin cancer   . Arthritis   . Hypertension   . High cholesterol   . GERD (gastroesophageal reflux disease)   . Panic     panic disorder  . Non Hodgkin's lymphoma     Past Surgical History  Procedure Date  . Back surgery   . Neck surgery     c2  . Skin biopsy   . Hernia repair   . Prostatectomy     No family history on file.  History  Substance Use Topics  . Smoking status: Not on file  . Smokeless tobacco: Not on file  . Alcohol Use:       Review of Systems  Constitutional: Negative for fever.  HENT: Negative for neck pain.   Eyes: Negative for redness.  Respiratory: Negative for shortness of breath.   Cardiovascular: Negative for chest pain.  Gastrointestinal: Negative for abdominal pain.  Genitourinary: Negative for flank pain.  Musculoskeletal: Negative for back pain.  Skin: Negative for rash.  Neurological: Negative for light-headedness and headaches.  Hematological: Does not bruise/bleed easily.  Psychiatric/Behavioral: Negative for confusion.    Allergies  Aspirin; Codeine; Iohexol; Macrodantin; and Morphine and related  Home Medications  No  current outpatient prescriptions on file.  BP 159/62  Pulse 95  Temp(Src) 98.3 F (36.8 C) (Oral)  Resp 16  SpO2 98%  Physical Exam  Nursing note and vitals reviewed. Constitutional: He is oriented to person, place, and time. He appears well-developed and well-nourished. No distress.  HENT:  Head: Atraumatic.  Eyes: Pupils are equal, round, and reactive to light.  Neck: Neck supple. No tracheal deviation present.  Cardiovascular: Normal rate, regular rhythm, normal heart sounds and intact distal pulses.   Pulmonary/Chest: Effort normal and breath sounds normal. No accessory muscle usage. No respiratory distress.  Abdominal: Soft. Bowel sounds are normal. He exhibits no distension and no mass. There is no tenderness. There is no rebound and no guarding.  Genitourinary: Guaiac positive stool.       No stool on rectal exam. Red blood, heme pos.   Musculoskeletal: Normal range of motion.  Neurological: He is alert and oriented to person, place, and time.  Skin: Skin is warm and dry.  Psychiatric: He has a normal mood and affect.    ED Course  Procedures (including critical care time)  Results for orders placed during the hospital encounter of 11/21/11  CBC      Component Value Range   WBC 4.8  4.0 - 10.5 (K/uL)   RBC 3.55 (*) 4.22 - 5.81 (MIL/uL)   Hemoglobin 11.4 (*) 13.0 - 17.0 (g/dL)   HCT 47.8 (*) 29.5 - 52.0 (%)  MCV 92.4  78.0 - 100.0 (fL)   MCH 32.1  26.0 - 34.0 (pg)   MCHC 34.8  30.0 - 36.0 (g/dL)   RDW 21.3  08.6 - 57.8 (%)   Platelets 152  150 - 400 (K/uL)  COMPREHENSIVE METABOLIC PANEL      Component Value Range   Sodium 139  135 - 145 (mEq/L)   Potassium 3.6  3.5 - 5.1 (mEq/L)   Chloride 106  96 - 112 (mEq/L)   CO2 24  19 - 32 (mEq/L)   Glucose, Bld 121 (*) 70 - 99 (mg/dL)   BUN 18  6 - 23 (mg/dL)   Creatinine, Ser 4.69  0.50 - 1.35 (mg/dL)   Calcium 9.2  8.4 - 62.9 (mg/dL)   Total Protein 6.9  6.0 - 8.3 (g/dL)   Albumin 3.7  3.5 - 5.2 (g/dL)   AST 13  0  - 37 (U/L)   ALT 12  0 - 53 (U/L)   Alkaline Phosphatase 86  39 - 117 (U/L)   Total Bilirubin 0.3  0.3 - 1.2 (mg/dL)   GFR calc non Af Amer 60 (*) >90 (mL/min)   GFR calc Af Amer 69 (*) >90 (mL/min)  PROTIME-INR      Component Value Range   Prothrombin Time 14.8  11.6 - 15.2 (seconds)   INR 1.14  0.00 - 1.49   TYPE AND SCREEN      Component Value Range   ABO/RH(D) O POS     Antibody Screen NEG     Sample Expiration 11/24/2011    ABO/RH      Component Value Range   ABO/RH(D) O POS         MDM  Iv ns bolus. protonix iv. Labs.  Reviewed nursing notes and prior charts/records.   Discussed w triad hospitalist-  Indicates admit to general bed, triad team 5, Dr Venetia Constable.   Recheck pt abd soft nt. Family now state pt has been taking naproxen prn, in addition to an 81 mg asa a day.  Pt has seen [facility] in past for colonoscopy - that service called to consult.   [facility] called - they indicate pt has seen [facility] md in past, [facility] called to see/consult.      Suzi Roots, MD 11/21/11 1139  Suzi Roots, MD 11/21/11 [phone]

## 2011-11-22 LAB — CBC
HCT: 27.6 % — ABNORMAL LOW (ref 39.0–52.0)
Hemoglobin: 10.1 g/dL — ABNORMAL LOW (ref 13.0–17.0)
MCH: 31.6 pg (ref 26.0–34.0)
MCHC: 35.6 g/dL (ref 30.0–36.0)
Platelets: 123 10*3/uL — ABNORMAL LOW (ref 150–400)
RBC: 3.04 MIL/uL — ABNORMAL LOW (ref 4.22–5.81)
RBC: 3.12 MIL/uL — ABNORMAL LOW (ref 4.22–5.81)
WBC: 4.9 10*3/uL (ref 4.0–10.5)

## 2011-11-22 LAB — COMPREHENSIVE METABOLIC PANEL
ALT: 10 U/L (ref 0–53)
AST: 11 U/L (ref 0–37)
CO2: 22 mEq/L (ref 19–32)
Calcium: 8.2 mg/dL — ABNORMAL LOW (ref 8.4–10.5)
Sodium: 138 mEq/L (ref 135–145)
Total Protein: 5.3 g/dL — ABNORMAL LOW (ref 6.0–8.3)

## 2011-11-22 LAB — APTT: aPTT: 33 seconds (ref 24–37)

## 2011-11-22 MED ORDER — ALPRAZOLAM 0.5 MG PO TABS
0.5000 mg | ORAL_TABLET | Freq: Two times a day (BID) | ORAL | Status: DC
  Administered 2011-11-22 – 2011-11-27 (×11): 0.5 mg via ORAL
  Filled 2011-11-22 (×11): qty 1

## 2011-11-22 MED ORDER — GUAIFENESIN 100 MG/5ML PO SOLN: 5.0000 mL | ORAL | Status: DC | PRN

## 2011-11-22 MED ORDER — VITAMIN K1 10 MG/ML IJ SOLN
5.0000 mg | Freq: Once | INTRAVENOUS | Status: AC
  Administered 2011-11-22: 5 mg via INTRAVENOUS
  Filled 2011-11-22: qty 0.5

## 2011-11-22 NOTE — Progress Notes (Signed)
[facility] Progress Note  Subjective: The patient had a positive bleeding scan last night showing bleeding in the jejunum. Last night he was having multiple bloody bowel movements, but he told me he felt it was slowing down, and today he has not had a bowel movement in the last 4-1/2 hours.  Objective: Vital signs in last 24 hours: Temp:  [97.1 F (36.2 C)-98.8 F (37.1 C)] 98 F (36.7 C) (01/18 0434) Pulse Rate:  [84-98] 96  (01/18 0434) Resp:  [15-20] 16  (01/18 0434) BP: (119-160)/(65-89) 159/78 mmHg (01/18 0434) SpO2:  [95 %-99 %] 96 % (01/18 0434) Weight:  [64.9 kg (143 lb 1.3 oz)] 64.9 kg (143 lb 1.3 oz) (01/17 2300) Weight change:    PE: He is alert and does not appear in any acute distress  Heart regular rhythm  Abdomen soft nontender  Lab Results: Results for orders placed during the hospital encounter of 11/21/11 (from the past 24 hour(s))  TSH     Status: Normal   Collection Time   11/21/11  2:00 PM      Component Value Range   TSH 0.453  0.350 - 4.500 (uIU/mL)  PREPARE RBC (CROSSMATCH)     Status: Normal   Collection Time   11/21/11  3:00 PM      Component Value Range   Order Confirmation ORDER PROCESSED BY BLOOD BANK    APTT     Status: Normal   Collection Time   11/21/11 10:16 PM      Component Value Range   aPTT 33  24 - 37 (seconds)  PROTIME-INR     Status: Abnormal   Collection Time   11/21/11 10:16 PM      Component Value Range   Prothrombin Time 16.0 (*) 11.6 - 15.2 (seconds)   INR 1.25  0.00 - 1.49   CBC     Status: Abnormal   Collection Time   11/21/11 10:16 PM      Component Value Range   WBC 5.5  4.0 - 10.5 (K/uL)   RBC 3.14 (*) 4.22 - 5.81 (MIL/uL)   Hemoglobin 10.2 (*) 13.0 - 17.0 (g/dL)   HCT 51.7 (*) 61.6 - 52.0 (%)   MCV 90.1  78.0 - 100.0 (fL)   MCH 32.5  26.0 - 34.0 (pg)   MCHC 36.0  30.0 - 36.0 (g/dL)   RDW 07.3  71.0 - 62.6 (%)   Platelets 153  150 - 400 (K/uL)  COMPREHENSIVE METABOLIC PANEL     Status: Abnormal   Collection Time   11/22/11  5:25 AM      Component Value Range   Sodium 138  135 - 145 (mEq/L)   Potassium 3.6  3.5 - 5.1 (mEq/L)   Chloride 110  96 - 112 (mEq/L)   CO2 22  19 - 32 (mEq/L)   Glucose, Bld 118 (*) 70 - 99 (mg/dL)   BUN 13  6 - 23 (mg/dL)   Creatinine, Ser 9.48  0.50 - 1.35 (mg/dL)   Calcium 8.2 (*) 8.4 - 10.5 (mg/dL)   Total Protein 5.3 (*) 6.0 - 8.3 (g/dL)   Albumin 2.7 (*) 3.5 - 5.2 (g/dL)   AST 11  0 - 37 (U/L)   ALT 10  0 - 53 (U/L)   Alkaline Phosphatase 61  39 - 117 (U/L)   Total Bilirubin 0.7  0.3 - 1.2 (mg/dL)   GFR calc non Af Amer 74 (*) >90 (mL/min)   GFR calc Af Amer 86 (*) >  90 (mL/min)  PROTIME-INR     Status: Abnormal   Collection Time   11/22/11  5:25 AM      Component Value Range   Prothrombin Time 16.3 (*) 11.6 - 15.2 (seconds)   INR 1.29  0.00 - 1.49   APTT     Status: Normal   Collection Time   11/22/11  5:25 AM      Component Value Range   aPTT 33  24 - 37 (seconds)  CBC     Status: Abnormal   Collection Time   11/22/11  5:56 AM      Component Value Range   WBC 4.2  4.0 - 10.5 (K/uL)   RBC 3.04 (*) 4.22 - 5.81 (MIL/uL)   Hemoglobin 9.6 (*) 13.0 - 17.0 (g/dL)   HCT 16.1 (*) 09.6 - 52.0 (%)   MCV 88.8  78.0 - 100.0 (fL)   MCH 31.6  26.0 - 34.0 (pg)   MCHC 35.6  30.0 - 36.0 (g/dL)   RDW 04.5  40.9 - 81.1 (%)   Platelets 123 (*) 150 - 400 (K/uL)    Studies/Results: @RISRSLT24 @    Assessment: Gastrointestinal bleeding which based on nuclear scan appears to be coming from the jejunum  Plan: Continue to monitor H&H. Transfuse blood as needed. Should bleeding continue I believe he will need an angiogram to localize the exact site and for embolization.    Emmarae Cowdery F 11/22/2011, 11:28 AM

## 2011-11-22 NOTE — Progress Notes (Signed)
UR CHART REVIEWED; B Alitza Cowman RN, BSN, MHA 

## 2011-11-22 NOTE — Progress Notes (Signed)
Appreciate Dr Evette Cristal input. Patient remains anxious. Says last bleed was this morning. Reviewed bleeding scan results and discussed with Dr Evette Cristal.  SUBJECTIVE "Iam anxious"   1. Rectal bleeding   2. GI bleeding     Past Medical History  Diagnosis Date  . Skin cancer   . Arthritis   . Hypertension   . High cholesterol   . GERD (gastroesophageal reflux disease)   . Panic     panic disorder  . Non Hodgkin's lymphoma    Current Facility-Administered Medications  Medication Dose Route Frequency Provider Last Rate Last Dose  . acetaminophen (TYLENOL) tablet 650 mg  650 mg Oral Q6H PRN Meloni Hinz, MD       Or  . acetaminophen (TYLENOL) suppository 650 mg  650 mg Rectal Q6H PRN Chrisette Man, MD      . ALPRAZolam Prudy Feeler) tablet 0.5 mg  0.5 mg Oral BID Arwen Haseley, MD      . dextrose 5 % and 0.9 % NaCl with KCl 20 mEq/L infusion   Intravenous Continuous Alaira Level, MD 75 mL/hr at 11/21/11 2159    . guaiFENesin (ROBITUSSIN) 100 MG/5ML solution 100 mg  5 mL Oral Q4H PRN Pamala Hayman, MD      . hydrALAZINE (APRESOLINE) injection 5 mg  5 mg Intravenous Q8H PRN Nancey Kreitz, MD      . ondansetron (ZOFRAN) tablet 4 mg  4 mg Oral Q6H PRN Medora Roorda, MD       Or  . ondansetron (ZOFRAN) injection 4 mg  4 mg Intravenous Q6H PRN Costa Jha, MD      . pantoprazole (PROTONIX) 80 mg in sodium chloride 0.9 % 250 mL infusion  8 mg/hr Intravenous Continuous Roch Quach, MD 25 mL/hr at 11/21/11 1530 8 mg/hr at 11/21/11 1530  . technetium labeled red blood cells (ULTRATAG) injection kit 25 milli Curie  25 milli Curie Intravenous Once PRN Medication Radiologist, MD   25 milli Curie at 11/21/11 2150  . DISCONTD: ALPRAZolam Prudy Feeler) tablet 0.5 mg  0.5 mg Oral QHS Sayyid Harewood, MD   0.5 mg at 11/21/11 2242  . DISCONTD: LORazepam (ATIVAN) 2 MG/ML concentrated solution 0.5 mg  0.5 mg Oral QHS Evangelyn Crouse, MD      . DISCONTD: LORazepam (ATIVAN) tablet 0.5 mg  0.5 mg Oral QHS Reeshemah Nazaryan, MD        Allergies  Allergen Reactions  . Codeine Other (See Comments)    Patient cannot recall reaction  . Iohexol      Desc: HAS SEIZURES WITH X-RAY DYE-GIVEN 120 MG SOLU0MEDROL, 25 MG BENADRYL, AND 25 MG PEPCID 1 HOUR PRIOR TO EXAM AND HAD NO PROBLEMS-ARS-08/15/07   . Macrodantin Other (See Comments)    Patient cannot recall reaction  . Morphine And Related Itching   Active Problems:  Rectal bleeding  HTN (hypertension)  GERD (gastroesophageal reflux disease)  Anxiety disorder  Arthritis  Anemia   Vital signs in last 24 hours: Temp:  [97.1 F (36.2 C)-98.8 F (37.1 C)] 98 F (36.7 C) (01/18 0434) Pulse Rate:  [84-101] 96  (01/18 0434) Resp:  [15-20] 16  (01/18 0434) BP: (117-160)/(59-89) 159/78 mmHg (01/18 0434) SpO2:  [95 %-99 %] 96 % (01/18 0434) Weight:  [64.9 kg (143 lb 1.3 oz)] 64.9 kg (143 lb 1.3 oz) (01/17 2300) Weight change:  Last BM Date: 11/22/11  Intake/Output from previous day: 01/17 0701 - 01/18 0700 In: 892.5 [I.V.:500; Blood:392.5] Out: 150 [Stool:150] Intake/Output this shift: Total I/O In: -  Out: 150 [Urine:150]  Lab Results:  [facility] 11/22/11 0556 11/21/11 2216  WBC 4.2 5.5  HGB 9.6* 10.2*  HCT 27.0* 28.3*  PLT 123* 153   BMET  Basename 11/22/11 0525 11/21/11 1000  NA 138 139  K 3.6 3.6  CL 110 106  CO2 22 24  GLUCOSE 118* 121*  BUN 13 18  CREATININE 0.90 1.08  CALCIUM 8.2* 9.2    Studies/Results: Ct Abdomen Pelvis Wo Contrast  11/21/2011  **ADDENDUM** CREATED: 11/21/2011 15:28:22  Correction for impression:  Is T11 vertebral body.  **END ADDENDUM** SIGNED BY: Natasha Mead, M.D.    11/21/2011  **ADDENDUM** CREATED: 11/21/2011 15:11:06  Clinical Data:  rectal bleeding, assess for diverticulitis/colitis;, history of lymphoma  CT ABDOMEN AND PELVIS  Technique:  Multidetector CT imaging of the abdomen and pelvis was performed following the standard protocol without intravenous contrast.  Comparison: 07/05/2011  CT ABDOMEN and pelvis  without IV contrast  Findings: Lung bases shows a stable emphysematous bulla in  right lower lobe.  Old right rib fractures are stable.  Stable scattered hepatic cysts the largest in the right hepatic lobe inferiorly measures 2.2 cm.  Extensive atherosclerotic calcifications of the abdominal aorta and the iliac arteries again noted.  Unenhanced pancreas spleen and adrenal glands are unremarkable.  Oral contrast material was given to the patient.  No small bowel obstruction.  No ascites or free air.  No adenopathy.  Bilateral multiple renal cysts are stable.  No hydronephrosis or hydroureter. No nephrolithiasis.  Stable sclerotic and trabecular changes bilateral hips pelvis and T11 vertebral body.  Again this may be due to Paget disease or prior lymphomatous involvement.  Tortuous abdominal aorta again noted.  There is no pericecal inflammation.  Multiple sigmoid colon diverticula are noted without evidence of acute diverticulitis.  Again noted asymmetric thickening of the left posterior wall of the urinary bladder or asymmetric mass effect from enlarged prostate gland.  The prostate gland is enlarged measures 5.7 by 6.6 cm.  Correlation with urology exam and cystoscopy is again recommended.  There is no evidence of distal colonic obstruction.  No pericolonic abscess or fluid collection is identified.  Metallic fixation material noted right proximal femur.  IMPRESSION:  1.  Stable hepatic and bilateral multiple renal cysts.  No hydronephrosis or hydroureter. 2.  No small bowel or colonic obstruction. 3.  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  Multiple sigmoid colon diverticula are noted without definite evidence of diverticulitis.  No pericolonic fluid collection is identified. 4.  Again noted asymmetric thickening of the left posterior wall of the urinary bladder or asymmetric indentation by  enlarged prostate gland.  Correlation with urology exam and cystoscopy is again recommended.  **END ADDENDUM** SIGNED BY: Natasha Mead, M.D.    11/21/2011  *RADIOLOGY REPORT*  Clinical Data:  CT ABDOMEN AND PELVIS WITHOUT CONTRAST  Technique:  Multidetector CT imaging of the abdomen and pelvis was performed following the standard protocol without intravenous contrast.  Comparison: None.  Findings:  IMPRESSION:  Original Report Authenticated By: Natasha Mead, M.D.   Nm Gi Blood Loss  11/21/2011  *RADIOLOGY REPORT*  Clinical Data: GI bleeding  NUCLEAR MEDICINE GASTROINTESTINAL BLEEDING STUDY  Technique:  Sequential abdominal images were obtained following intravenous administration of Tc-23m labeled red blood cells.  Radiopharmaceutical: 24 mCi technetium 67m RBCs  Comparison: CT abdomen pelvis dated 11/21/2011  Findings: At approximately 40 minutes, increased  radiotracer uptake is seen within a loop of small bowel in the left mid abdomen, which then peristalsis distally into the midline lower abdomen/pelvis.  This appearance is compatible with active bleeding within small bowel in the left abdomen, possibly jejunum.  IMPRESSION: Active bleeding within small bowel in the left abdomen, possibly jejunum.  Original Report Authenticated By: Charline Bills, M.D.    Medications: I have reviewed the patient's current medications.   Physical exam GENERAL- alert, anxious. HEAD- normal atraumatic, no neck masses, normal thyroid, no jvd RESPIRATORY- appears well, vitals normal, no respiratory distress, acyanotic, normal RR, ear and throat exam is normal, neck free of mass or lymphadenopathy, chest clear, no wheezing, crepitations, rhonchi, normal symmetric air entry CVS- regular rate and rhythm, S1, S2 normal, no murmur, click, rub or gallop ABDOMEN- abdomen is soft without significant tenderness, masses, organomegaly or guarding NEURO- Grossly normal EXTREMITIES- extremities normal, atraumatic, no cyanosis or  edema  Plan .Rectal bleeding-Hb>9. Hemodynamically stable. May need angiogram. Keep NPO except some meds. Marland KitchenHTN (hypertension)- controlled. On Hydralazine prn. Marland KitchenGERD (gastroesophageal reflux disease)- on ppi .Anxiety disorder- xanax bid as seems quite anxious. Worried about his sick 40 year old wife who is at home. .Arthritis- analgesics as needed. .Anemia- acute on chronic, monitor. Transfuse prbc as needed.  Condition remains guarded.      Shakeitha Umbaugh 11/22/2011 11:21 AM Pager: [phone].

## 2011-11-23 LAB — URINE CULTURE: Culture  Setup Time: 201301191159

## 2011-11-23 LAB — BASIC METABOLIC PANEL
CO2: 22 mEq/L (ref 19–32)
Calcium: 8.3 mg/dL — ABNORMAL LOW (ref 8.4–10.5)
Creatinine, Ser: 0.89 mg/dL (ref 0.50–1.35)
Glucose, Bld: 119 mg/dL — ABNORMAL HIGH (ref 70–99)
Sodium: 139 mEq/L (ref 135–145)

## 2011-11-23 LAB — CBC
Hemoglobin: 9.8 g/dL — ABNORMAL LOW (ref 13.0–17.0)
MCH: 32 pg (ref 26.0–34.0)
MCV: 89.2 fL (ref 78.0–100.0)
RBC: 3.06 MIL/uL — ABNORMAL LOW (ref 4.22–5.81)

## 2011-11-23 LAB — PROTIME-INR: INR: 1.13 (ref 0.00–1.49)

## 2011-11-23 LAB — URINALYSIS, ROUTINE W REFLEX MICROSCOPIC
Bilirubin Urine: NEGATIVE
Nitrite: NEGATIVE
Specific Gravity, Urine: 1.013 (ref 1.005–1.030)
pH: 6.5 (ref 5.0–8.0)

## 2011-11-23 MED ORDER — CARVEDILOL 12.5 MG PO TABS
12.5000 mg | ORAL_TABLET | Freq: Two times a day (BID) | ORAL | Status: DC
  Administered 2011-11-23 – 2011-11-27 (×8): 12.5 mg via ORAL
  Filled 2011-11-23 (×9): qty 1

## 2011-11-23 MED ORDER — LORAZEPAM 2 MG/ML IJ SOLN
1.0000 mg | Freq: Once | INTRAMUSCULAR | Status: AC
  Administered 2011-11-23: 1 mg via INTRAVENOUS
  Filled 2011-11-23: qty 1

## 2011-11-23 NOTE — Progress Notes (Addendum)
Pt had a bloody stool with a few small clots at 0100 this AM. Before that, last bloody stool was at 0700 on 11/22/11. Frank Kramer 11/23/2011 6:28 AM

## 2011-11-23 NOTE — Progress Notes (Signed)
[facility] Progress Note  Subjective: Feels okay, scared about the seriousness of his condition. Had some bloody bowel movement during the night.  Objective: Vital signs in last 24 hours: Temp:  [97.8 F (36.6 C)-98.5 F (36.9 C)] 97.9 F (36.6 C) (01/19 0930) Pulse Rate:  [92-114] 92  (01/19 0930) Resp:  [16-30] 20  (01/19 0930) BP: (113-170)/(62-76) 170/75 mmHg (01/19 0930) SpO2:  [97 %-99 %] 99 % (01/19 0930) Weight change:    PE: Alert oriented no acute distress somewhat emotionally labile  Lab Results: Results for orders placed during the hospital encounter of 11/21/11 (from the past 24 hour(s))  CBC     Status: Abnormal   Collection Time   11/22/11  1:50 PM      Component Value Range   WBC 4.9  4.0 - 10.5 (K/uL)   RBC 3.12 (*) 4.22 - 5.81 (MIL/uL)   Hemoglobin 10.1 (*) 13.0 - 17.0 (g/dL)   HCT 16.1 (*) 09.6 - 52.0 (%)   MCV 88.5  78.0 - 100.0 (fL)   MCH 32.4  26.0 - 34.0 (pg)   MCHC 36.6 (*) 30.0 - 36.0 (g/dL)   RDW 04.5  40.9 - 81.1 (%)   Platelets 117 (*) 150 - 400 (K/uL)  CBC     Status: Abnormal   Collection Time   11/23/11  4:00 AM      Component Value Range   WBC 4.6  4.0 - 10.5 (K/uL)   RBC 3.06 (*) 4.22 - 5.81 (MIL/uL)   Hemoglobin 9.8 (*) 13.0 - 17.0 (g/dL)   HCT 91.4 (*) 78.2 - 52.0 (%)   MCV 89.2  78.0 - 100.0 (fL)   MCH 32.0  26.0 - 34.0 (pg)   MCHC 35.9  30.0 - 36.0 (g/dL)   RDW 95.6  21.3 - 08.6 (%)   Platelets 110 (*) 150 - 400 (K/uL)  BASIC METABOLIC PANEL     Status: Abnormal   Collection Time   11/23/11  4:00 AM      Component Value Range   Sodium 139  135 - 145 (mEq/L)   Potassium 3.7  3.5 - 5.1 (mEq/L)   Chloride 112  96 - 112 (mEq/L)   CO2 22  19 - 32 (mEq/L)   Glucose, Bld 119 (*) 70 - 99 (mg/dL)   BUN 9  6 - 23 (mg/dL)   Creatinine, Ser 5.78  0.50 - 1.35 (mg/dL)   Calcium 8.3 (*) 8.4 - 10.5 (mg/dL)   GFR calc non Af Amer 75 (*) >90 (mL/min)   GFR calc Af Amer 87 (*) >90 (mL/min)  PROTIME-INR     Status: Normal   Collection Time   11/23/11  4:00 AM      Component Value Range   Prothrombin Time 14.7  11.6 - 15.2 (seconds)   INR 1.13  0.00 - 1.49   URINALYSIS, ROUTINE W REFLEX MICROSCOPIC     Status: Abnormal   Collection Time   11/23/11  4:30 AM      Component Value Range   Color, Urine YELLOW  YELLOW    APPearance CLOUDY (*) CLEAR    Specific Gravity, Urine 1.013  1.005 - 1.030    pH 6.5  5.0 - 8.0    Glucose, UA NEGATIVE  NEGATIVE (mg/dL)   Hgb urine dipstick LARGE (*) NEGATIVE    Bilirubin Urine NEGATIVE  NEGATIVE    Ketones, ur NEGATIVE  NEGATIVE (mg/dL)   Protein, ur NEGATIVE  NEGATIVE (mg/dL)   Urobilinogen, UA  0.2  0.0 - 1.0 (mg/dL)   Nitrite NEGATIVE  NEGATIVE    Leukocytes, UA LARGE (*) NEGATIVE   URINE MICROSCOPIC-ADD ON     Status: Normal   Collection Time   11/23/11  4:30 AM      Component Value Range   WBC, UA 11-20  <3 (WBC/hpf)   RBC / HPF 21-50  <3 (RBC/hpf)   Bacteria, UA RARE  RARE     Studies/Results: Ct Abdomen Pelvis Wo Contrast  11/21/2011  **ADDENDUM** CREATED: 11/21/2011 15:28:22  Correction for impression:  Is T11 vertebral body.  **END ADDENDUM** SIGNED BY: Natasha Mead, M.D.    11/21/2011  **ADDENDUM** CREATED: 11/21/2011 15:11:06  Clinical Data:  rectal bleeding, assess for diverticulitis/colitis;, history of lymphoma  CT ABDOMEN AND PELVIS  Technique:  Multidetector CT imaging of the abdomen and pelvis was performed following the standard protocol without intravenous contrast.  Comparison: 07/05/2011  CT ABDOMEN and pelvis without IV contrast  Findings: Lung bases shows a stable emphysematous bulla in  right lower lobe.  Old right rib fractures are stable.  Stable scattered hepatic cysts the largest in the right hepatic lobe inferiorly measures 2.2 cm.  Extensive atherosclerotic calcifications of the abdominal aorta and the iliac arteries again noted.  Unenhanced pancreas spleen and adrenal glands are unremarkable.  Oral contrast material was given to the patient.  No  small bowel obstruction.  No ascites or free air.  No adenopathy.  Bilateral multiple renal cysts are stable.  No hydronephrosis or hydroureter. No nephrolithiasis.  Stable sclerotic and trabecular changes bilateral hips pelvis and T11 vertebral body.  Again this may be due to Paget disease or prior lymphomatous involvement.  Tortuous abdominal aorta again noted.  There is no pericecal inflammation.  Multiple sigmoid colon diverticula are noted without evidence of acute diverticulitis.  Again noted asymmetric thickening of the left posterior wall of the urinary bladder or asymmetric mass effect from enlarged prostate gland.  The prostate gland is enlarged measures 5.7 by 6.6 cm.  Correlation with urology exam and cystoscopy is again recommended.  There is no evidence of distal colonic obstruction.  No pericolonic abscess or fluid collection is identified.  Metallic fixation material noted right proximal femur.  IMPRESSION:  1.  Stable hepatic and bilateral multiple renal cysts.  No hydronephrosis or hydroureter. 2.  No small bowel or colonic obstruction. 3.  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  Multiple sigmoid colon diverticula are noted without definite evidence of diverticulitis.  No pericolonic fluid collection is identified. 4.  Again noted asymmetric thickening of the left posterior wall of the urinary bladder or asymmetric indentation by enlarged prostate gland.  Correlation with urology exam and cystoscopy is again recommended.  **END ADDENDUM** SIGNED BY: Natasha Mead, M.D.    11/21/2011  *RADIOLOGY REPORT*  Clinical Data:  CT ABDOMEN AND PELVIS WITHOUT CONTRAST  Technique:  Multidetector CT imaging of the abdomen and pelvis was performed following the standard protocol without intravenous contrast.  Comparison: None.  Findings:  IMPRESSION:  Original Report Authenticated By: Natasha Mead,  M.D.   Nm Gi Blood Loss  11/21/2011  *RADIOLOGY REPORT*  Clinical Data: GI bleeding  NUCLEAR MEDICINE GASTROINTESTINAL BLEEDING STUDY  Technique:  Sequential abdominal images were obtained following intravenous administration of Tc-74m labeled red blood cells.  Radiopharmaceutical: 24 mCi technetium 68m RBCs  Comparison: CT abdomen pelvis dated 11/21/2011  Findings:  At approximately 40 minutes, increased radiotracer uptake is seen within a loop of small bowel in the left mid abdomen, which then peristalsis distally into the midline lower abdomen/pelvis.  This appearance is compatible with active bleeding within small bowel in the left abdomen, possibly jejunum.  IMPRESSION: Active bleeding within small bowel in the left abdomen, possibly jejunum.  Original Report Authenticated By: Charline Bills, M.D.      Assessment: GI bleeding with jejunal source suggested by pulled RBC scan. Plan:  Continue to monitor stools and hemoglobin. If bleeding persists will consult interventional radiology for possible embolization.    Karynn Deblasi,Jann C 11/23/2011, 10:34 AM

## 2011-11-23 NOTE — Progress Notes (Signed)
Appreciate Gi assistance. Patient still passing old blood per rectum.  SUBJECTIVE "scared".   1. Rectal bleeding   2. GI bleeding     Past Medical History  Diagnosis Date  . Skin cancer   . Arthritis   . Hypertension   . High cholesterol   . GERD (gastroesophageal reflux disease)   . Panic     panic disorder  . Non Hodgkin's lymphoma    Current Facility-Administered Medications  Medication Dose Route Frequency Provider Last Rate Last Dose  . acetaminophen (TYLENOL) tablet 650 mg  650 mg Oral Q6H PRN Richard Holz, MD   650 mg at 11/22/11 2117   Or  . acetaminophen (TYLENOL) suppository 650 mg  650 mg Rectal Q6H PRN Addie Cederberg, MD      . ALPRAZolam Prudy Feeler) tablet 0.5 mg  0.5 mg Oral BID Jujuan Dugo, MD   0.5 mg at 11/23/11 0932  . carvedilol (COREG) tablet 12.5 mg  12.5 mg Oral BID Bernardino Dowell, MD      . dextrose 5 % and 0.9 % NaCl with KCl 20 mEq/L infusion   Intravenous Continuous Derwood Becraft, MD 75 mL/hr at 11/23/11 0522    . guaiFENesin (ROBITUSSIN) 100 MG/5ML solution 100 mg  5 mL Oral Q4H PRN Lizabeth Fellner, MD      . hydrALAZINE (APRESOLINE) injection 5 mg  5 mg Intravenous Q8H PRN Ethan Kasperski, MD      . LORazepam (ATIVAN) injection 1 mg  1 mg Intravenous Once Mary A. Lynch, NP   1 mg at 11/23/11 0530  . ondansetron (ZOFRAN) tablet 4 mg  4 mg Oral Q6H PRN Abia Monaco, MD       Or  . ondansetron (ZOFRAN) injection 4 mg  4 mg Intravenous Q6H PRN Camreigh Michie, MD      . pantoprazole (PROTONIX) 80 mg in sodium chloride 0.9 % 250 mL infusion  8 mg/hr Intravenous Continuous Shawnta Schlegel, MD 25 mL/hr at 11/23/11 1304 8 mg/hr at 11/23/11 1304   Allergies  Allergen Reactions  . Codeine Other (See Comments)    Patient cannot recall reaction  . Iohexol      Desc: HAS SEIZURES WITH X-RAY DYE-GIVEN 120 MG SOLU0MEDROL, 25 MG BENADRYL, AND 25 MG PEPCID 1 HOUR PRIOR TO EXAM AND HAD NO PROBLEMS-ARS-08/15/07   . Macrodantin Other (See Comments)    Patient cannot recall  reaction  . Morphine And Related Itching   Active Problems:  Rectal bleeding  HTN (hypertension)  GERD (gastroesophageal reflux disease)  Anxiety disorder  Arthritis  Anemia   Vital signs in last 24 hours: Temp:  [97.8 F (36.6 C)-97.9 F (36.6 C)] 97.8 F (36.6 C) (01/19 1430) Pulse Rate:  [92-114] 92  (01/19 1430) Resp:  [20-30] 22  (01/19 1430) BP: (113-170)/(62-79) 148/79 mmHg (01/19 1430) SpO2:  [97 %-100 %] 100 % (01/19 1430) Weight change:  Last BM Date: 11/23/11  Intake/Output from previous day: 01/18 0701 - 01/19 0700 In: -  Out: 751 [Urine:750; Stool:1] Intake/Output this shift: Total I/O In: 800 [I.V.:800] Out: 627 [Urine:625; Stool:2]  Lab Results:  Basename 11/23/11 0400 11/22/11 1350  WBC 4.6 4.9  HGB 9.8* 10.1*  HCT 27.3* 27.6*  PLT 110* 117*   BMET  Basename 11/23/11 0400 11/22/11 0525  NA 139 138  K 3.7 3.6  CL 112 110  CO2 22 22  GLUCOSE 119* 118*  BUN 9 13  CREATININE 0.89 0.90  CALCIUM 8.3* 8.2*    Studies/Results: Nm Gi Blood  Loss  11/21/2011  *RADIOLOGY REPORT*  Clinical Data: GI bleeding  NUCLEAR MEDICINE GASTROINTESTINAL BLEEDING STUDY  Technique:  Sequential abdominal images were obtained following intravenous administration of Tc-8m labeled red blood cells.  Radiopharmaceutical: 24 mCi technetium 34m RBCs  Comparison: CT abdomen pelvis dated 11/21/2011  Findings: At approximately 40 minutes, increased radiotracer uptake is seen within a loop of small bowel in the left mid abdomen, which then peristalsis distally into the midline lower abdomen/pelvis.  This appearance is compatible with active bleeding within small bowel in the left abdomen, possibly jejunum.  IMPRESSION: Active bleeding within small bowel in the left abdomen, possibly jejunum.  Original Report Authenticated By: Charline Bills, M.D.    Medications: I have reviewed the patient's current medications.   Physical exam GENERAL- alert HEAD- normal atraumatic, no  neck masses, normal thyroid, no jvd RESPIRATORY- appears well, vitals normal, no respiratory distress, acyanotic, normal RR, ear and throat exam is normal, neck free of mass or lymphadenopathy, chest clear, no wheezing, crepitations, rhonchi, normal symmetric air entry CVS- regular rate and rhythm, S1, S2 normal, no murmur, click, rub or gallop ABDOMEN- abdomen is soft without significant tenderness, masses, organomegaly or guarding NEURO- Grossly normal EXTREMITIES- extremities normal, atraumatic, no cyanosis or edema  Plan .Rectal bleeding-Hb>9. Hemodynamically stable. May need angiogram. Thrombocytopenia not helping the bleeding. On clears. Defer management to gi.  Marland KitchenHTN (hypertension)- slowly resume home meds, mindful of gi bleeding. Marland KitchenGERD (gastroesophageal reflux disease)- on ppi .Anxiety disorder-more calm today. .Arthritis- analgesics as needed. .Anemia- acute on chronic, monitor. Transfuse prbc as needed.  Condition remains guarded.      Ermin Parisien 11/23/2011 5:29 PM Pager: [phone].

## 2011-11-24 LAB — DIFFERENTIAL
Eosinophils Relative: 6 % — ABNORMAL HIGH (ref 0–5)
Lymphocytes Relative: 23 % (ref 12–46)
Lymphs Abs: 1.1 10*3/uL (ref 0.7–4.0)
Monocytes Relative: 10 % (ref 3–12)
Neutrophils Relative %: 60 % (ref 43–77)

## 2011-11-24 LAB — CBC
HCT: 25.4 % — ABNORMAL LOW (ref 39.0–52.0)
Hemoglobin: 9 g/dL — ABNORMAL LOW (ref 13.0–17.0)
MCHC: 35.4 g/dL (ref 30.0–36.0)
MCV: 90.4 fL (ref 78.0–100.0)
Platelets: 106 10*3/uL — ABNORMAL LOW (ref 150–400)
Platelets: 110 10*3/uL — ABNORMAL LOW (ref 150–400)
RBC: 2.81 MIL/uL — ABNORMAL LOW (ref 4.22–5.81)
RDW: 14.3 % (ref 11.5–15.5)
WBC: 4.4 10*3/uL (ref 4.0–10.5)

## 2011-11-24 LAB — BASIC METABOLIC PANEL
Calcium: 8.3 mg/dL — ABNORMAL LOW (ref 8.4–10.5)
GFR calc Af Amer: 73 mL/min — ABNORMAL LOW (ref >90)
GFR calc non Af Amer: 63 mL/min — ABNORMAL LOW (ref >90)
Sodium: 142 mEq/L (ref 135–145)

## 2011-11-24 LAB — PROTIME-INR
INR: 1.26 (ref 0.00–1.49)
Prothrombin Time: 16.1 seconds — ABNORMAL HIGH (ref 11.6–15.2)

## 2011-11-24 NOTE — Progress Notes (Signed)
[name] states he feels ok. He has had 2 bms today- dark colored but no frank blood.    1. Rectal bleeding   2. GI bleeding     Past Medical History  Diagnosis Date  . Skin cancer   . Arthritis   . Hypertension   . High cholesterol   . GERD (gastroesophageal reflux disease)   . Panic     panic disorder  . Non Hodgkin's lymphoma    Current Facility-Administered Medications  Medication Dose Route Frequency Provider Last Rate Last Dose  . acetaminophen (TYLENOL) tablet 650 mg  650 mg Oral Q6H PRN Ademola Vert, MD   650 mg at 11/23/11 2112   Or  . acetaminophen (TYLENOL) suppository 650 mg  650 mg Rectal Q6H PRN Amery Vandenbos, MD      . ALPRAZolam Prudy Feeler) tablet 0.5 mg  0.5 mg Oral BID Wannetta Langland, MD   0.5 mg at 11/24/11 0934  . carvedilol (COREG) tablet 12.5 mg  12.5 mg Oral BID Orrie Schubert, MD   12.5 mg at 11/24/11 0934  . guaiFENesin (ROBITUSSIN) 100 MG/5ML solution 100 mg  5 mL Oral Q4H PRN Nalanie Winiecki, MD      . hydrALAZINE (APRESOLINE) injection 5 mg  5 mg Intravenous Q8H PRN Elvan Ebron, MD      . ondansetron (ZOFRAN) tablet 4 mg  4 mg Oral Q6H PRN Zeddie Njie, MD       Or  . ondansetron (ZOFRAN) injection 4 mg  4 mg Intravenous Q6H PRN Greyson Peavy, MD   4 mg at 11/24/11 1826  . pantoprazole (PROTONIX) 80 mg in sodium chloride 0.9 % 250 mL infusion  8 mg/hr Intravenous Continuous Kivon Aprea, MD 25 mL/hr at 11/24/11 1200 8 mg/hr at 11/24/11 1200   Allergies  Allergen Reactions  . Codeine Other (See Comments)    Patient cannot recall reaction  . Iohexol      Desc: HAS SEIZURES WITH X-RAY DYE-GIVEN 120 MG SOLU0MEDROL, 25 MG BENADRYL, AND 25 MG PEPCID 1 HOUR PRIOR TO EXAM AND HAD NO PROBLEMS-ARS-08/15/07   . Macrodantin Other (See Comments)    Patient cannot recall reaction  . Morphine And Related Itching   Active Problems:  Rectal bleeding  HTN (hypertension)  GERD (gastroesophageal reflux disease)  Anxiety disorder  Arthritis  Anemia   Vital  signs in last 24 hours: Temp:  [97.9 F (36.6 C)-98.3 F (36.8 C)] 97.9 F (36.6 C) (01/20 1328) Pulse Rate:  [79-91] 85  (01/20 1328) Resp:  [16] 16  (01/20 1328) BP: (147-159)/(67-78) 147/73 mmHg (01/20 1328) SpO2:  [95 %-99 %] 99 % (01/20 1328) Weight change:  Last BM Date: 11/23/11  Intake/Output from previous day: 01/19 0701 - 01/20 0700 In: 1400 [I.V.:1225; IV Piggyback:175] Out: 627 [Urine:625; Stool:2] Intake/Output this shift:    Lab Results:  Basename 11/24/11 1552 11/24/11 0334  WBC 4.7 4.4  HGB 9.0* 9.0*  HCT 25.4* 25.4*  PLT 110* 106*   BMET  Basename 11/24/11 0334 11/23/11 0400  NA 142 139  K 3.5 3.7  CL 112 112  CO2 23 22  GLUCOSE 98 119*  BUN 7 9  CREATININE 1.03 0.89  CALCIUM 8.3* 8.3*    Studies/Results: No results found.  Medications: I have reviewed the patient's current medications.   Physical exam GENERAL- alert HEAD- normal atraumatic, no neck masses, normal thyroid, no jvd RESPIRATORY- appears well, vitals normal, no respiratory distress, acyanotic, normal RR, ear and throat exam is normal, neck free  of mass or lymphadenopathy, chest clear, no wheezing, crepitations, rhonchi, normal symmetric air entry CVS- regular rate and rhythm, S1, S2 normal, no murmur, click, rub or gallop ABDOMEN- abdomen is soft without significant tenderness, masses, organomegaly or guarding NEURO- Grossly normal EXTREMITIES- extremities normal, atraumatic, no cyanosis or edema  Plan .Rectal bleeding-Hb>9. Remains Hemodynamically stable, although apparently passing melena still. May need angiogram.   .HTN (hypertension)- BP better controlled. Marland KitchenGERD (gastroesophageal reflux disease)- on ppi .Anxiety disorder-more calm today.  .Arthritis- analgesics as needed. .Anemia- acute on chronic, monitor. Transfuse prbc as needed.  Condition remains guarded.      Kristine Chahal 11/24/2011 7:43 PM Pager: [phone].

## 2011-11-25 MED ORDER — AMLODIPINE BESYLATE 10 MG PO TABS
10.0000 mg | ORAL_TABLET | Freq: Every day | ORAL | Status: DC
  Administered 2011-11-25 – 2011-11-26 (×2): 10 mg via ORAL
  Filled 2011-11-25 (×3): qty 1

## 2011-11-25 MED ORDER — NABUMETONE 750 MG PO TABS
750.0000 mg | ORAL_TABLET | Freq: Two times a day (BID) | ORAL | Status: DC
  Administered 2011-11-25 – 2011-11-26 (×2): 750 mg via ORAL
  Filled 2011-11-25 (×3): qty 1

## 2011-11-25 NOTE — Progress Notes (Signed)
[facility] Progress Note  Subjective: The patient is doing well today. He has no specific complaints. He is tolerating clear liquids. He had a bowel movement about 6 hours ago and according to him he told me that the nurse says it didn't look like there was any blood in it. His hemoglobin yesterday has maintained at 9 g. Hemoglobin today is pending.  Objective: Vital signs in last 24 hours: Temp:  [97.8 F (36.6 C)-98 F (36.7 C)] 97.8 F (36.6 C) (01/21 0430) Pulse Rate:  [68-86] 68  (01/21 0430) Resp:  [16-20] 20  (01/21 0430) BP: (133-167)/(65-73) 167/66 mmHg (01/21 0430) SpO2:  [97 %-99 %] 99 % (01/21 0430) Weight change:    PE: He is in no distress  Heart regular rhythm  Abdomen soft nontender  Lab Results: Results for orders placed during the hospital encounter of 11/21/11 (from the past 24 hour(s))  CBC     Status: Abnormal   Collection Time   11/24/11  3:52 PM      Component Value Range   WBC 4.7  4.0 - 10.5 (K/uL)   RBC 2.81 (*) 4.22 - 5.81 (MIL/uL)   Hemoglobin 9.0 (*) 13.0 - 17.0 (g/dL)   HCT 16.1 (*) 09.6 - 52.0 (%)   MCV 90.4  78.0 - 100.0 (fL)   MCH 32.0  26.0 - 34.0 (pg)   MCHC 35.4  30.0 - 36.0 (g/dL)   RDW 04.5  40.9 - 81.1 (%)   Platelets 110 (*) 150 - 400 (K/uL)  DIFFERENTIAL     Status: Abnormal   Collection Time   11/24/11  3:52 PM      Component Value Range   Neutrophils Relative 60  43 - 77 (%)   Neutro Abs 2.8  1.7 - 7.7 (K/uL)   Lymphocytes Relative 23  12 - 46 (%)   Lymphs Abs 1.1  0.7 - 4.0 (K/uL)   Monocytes Relative 10  3 - 12 (%)   Monocytes Absolute 0.5  0.1 - 1.0 (K/uL)   Eosinophils Relative 6 (*) 0 - 5 (%)   Eosinophils Absolute 0.3  0.0 - 0.7 (K/uL)   Basophils Relative 0  0 - 1 (%)   Basophils Absolute 0.0  0.0 - 0.1 (K/uL)    Studies/Results: @RISRSLT24 @    Assessment: Gastrointestinal bleeding from a jejunal source  Plan: Continue observation. Repeat CBC pending. If this is stable I think we can advance  his diet. He is anxious to go home.    Geneieve Duell F 11/25/2011, 9:40 AM

## 2011-11-25 NOTE — Progress Notes (Signed)
SUBJECTIVE "ok". No more bleeding. Worried what would happen if he went home and bled again.   1. Rectal bleeding   2. GI bleeding     Past Medical History  Diagnosis Date  . Skin cancer   . Arthritis   . Hypertension   . High cholesterol   . GERD (gastroesophageal reflux disease)   . Panic     panic disorder  . Non Hodgkin's lymphoma    Current Facility-Administered Medications  Medication Dose Route Frequency Provider Last Rate Last Dose  . acetaminophen (TYLENOL) tablet 650 mg  650 mg Oral Q6H PRN Chasidy Janak, MD   650 mg at 11/24/11 2120   Or  . acetaminophen (TYLENOL) suppository 650 mg  650 mg Rectal Q6H PRN Favor Kreh, MD      . ALPRAZolam Prudy Feeler) tablet 0.5 mg  0.5 mg Oral BID Roanna Reaves, MD   0.5 mg at 11/25/11 1002  . carvedilol (COREG) tablet 12.5 mg  12.5 mg Oral BID Taeveon Keesling, MD   12.5 mg at 11/25/11 1002  . guaiFENesin (ROBITUSSIN) 100 MG/5ML solution 100 mg  5 mL Oral Q4H PRN Jakyron Fabro, MD      . hydrALAZINE (APRESOLINE) injection 5 mg  5 mg Intravenous Q8H PRN Juline Sanderford, MD      . ondansetron (ZOFRAN) tablet 4 mg  4 mg Oral Q6H PRN Celestial Barnfield, MD       Or  . ondansetron (ZOFRAN) injection 4 mg  4 mg Intravenous Q6H PRN Roselle Norton, MD   4 mg at 11/24/11 1826  . pantoprazole (PROTONIX) 80 mg in sodium chloride 0.9 % 250 mL infusion  8 mg/hr Intravenous Continuous Vinicius Brockman, MD 25 mL/hr at 11/25/11 1004 8 mg/hr at 11/25/11 1004   Allergies  Allergen Reactions  . Codeine Other (See Comments)    Patient cannot recall reaction  . Iohexol      Desc: HAS SEIZURES WITH X-RAY DYE-GIVEN 120 MG SOLU0MEDROL, 25 MG BENADRYL, AND 25 MG PEPCID 1 HOUR PRIOR TO EXAM AND HAD NO PROBLEMS-ARS-08/15/07   . Macrodantin Other (See Comments)    Patient cannot recall reaction  . Morphine And Related Itching   Active Problems:  Rectal bleeding  HTN (hypertension)  GERD (gastroesophageal reflux disease)  Anxiety disorder  Arthritis   Anemia   Vital signs in last 24 hours: Temp:  [97.7 F (36.5 C)-98 F (36.7 C)] 97.7 F (36.5 C) (01/21 1409) Pulse Rate:  [68-92] 92  (01/21 1409) Resp:  [18-20] 18  (01/21 1409) BP: (133-168)/(62-78) 161/62 mmHg (01/21 1409) SpO2:  [94 %-99 %] 94 % (01/21 1409) Weight change:  Last BM Date: 11/25/11  Intake/Output from previous day: 01/20 0701 - 01/21 0700 In: 760 [P.O.:480; I.V.:200] Out: 1302 [Urine:1300; Stool:2] Intake/Output this shift:    Lab Results:  Basename 11/24/11 1552 11/24/11 0334  WBC 4.7 4.4  HGB 9.0* 9.0*  HCT 25.4* 25.4*  PLT 110* 106*   BMET  Basename 11/24/11 0334 11/23/11 0400  NA 142 139  K 3.5 3.7  CL 112 112  CO2 23 22  GLUCOSE 98 119*  BUN 7 9  CREATININE 1.03 0.89  CALCIUM 8.3* 8.3*    Studies/Results: No results found.  Medications: I have reviewed the patient's current medications.   Physical exam GENERAL- alert HEAD- normal atraumatic, no neck masses, normal thyroid, no jvd RESPIRATORY- appears well, vitals normal, no respiratory distress, acyanotic, normal RR, ear and throat exam is normal, neck free of mass or lymphadenopathy,  chest clear, no wheezing, crepitations, rhonchi, normal symmetric air entry CVS- regular rate and rhythm, S1, S2 normal, no murmur, click, rub or gallop ABDOMEN- abdomen is soft without significant tenderness, masses, organomegaly or guarding NEURO- Grossly normal EXTREMITIES- extremities normal, atraumatic, no cyanosis or edema  Plan .Rectal bleeding-Bleeding seems to have stopped. Hb remains stable. Appreciate gi. If goes home, no nsaids? Especially with thrombocytopenia. apparently passing melena still. May need angiogram.  .HTN (hypertension)- BP fluctuating. Resume all home meds.  Marland KitchenGERD (gastroesophageal reflux disease)- on ppi .Anxiety disorder-more calm today.  .Arthritis- analgesics as needed. .Anemia- acute on chronic, monitor. Transfuse prbc as needed.  Home soon if no further w/u gi  wise.       Ximenna Fonseca 11/25/2011 7:38 PM Pager: [phone].

## 2011-11-26 LAB — CBC
HCT: 24.5 % — ABNORMAL LOW (ref 39.0–52.0)
MCH: 31.7 pg (ref 26.0–34.0)
MCV: 90.4 fL (ref 78.0–100.0)
RBC: 2.71 MIL/uL — ABNORMAL LOW (ref 4.22–5.81)
WBC: 4.2 10*3/uL (ref 4.0–10.5)

## 2011-11-26 MED ORDER — BIOTENE DRY MOUTH MT LIQD
15.0000 mL | Freq: Two times a day (BID) | OROMUCOSAL | Status: DC
  Administered 2011-11-26 (×2): 15 mL via OROMUCOSAL

## 2011-11-26 MED ORDER — PANTOPRAZOLE SODIUM 40 MG PO TBEC
40.0000 mg | DELAYED_RELEASE_TABLET | Freq: Two times a day (BID) | ORAL | Status: DC
  Administered 2011-11-26 – 2011-11-27 (×2): 40 mg via ORAL
  Filled 2011-11-26 (×3): qty 1

## 2011-11-26 MED ORDER — SODIUM CHLORIDE 0.9 % IV SOLN
25.0000 mg | Freq: Once | INTRAVENOUS | Status: AC
  Administered 2011-11-26: 25 mg via INTRAVENOUS
  Filled 2011-11-26: qty 2

## 2011-11-26 NOTE — Discharge Summary (Signed)
DISCHARGE SUMMARY  Frank DOMAGALSKI  MR#: [id number]  DOB:Oct 04, 1922  Date of Admission: 11/21/2011 Date of Discharge: 11/26/2011  Attending Physician:Jakeb Lamping  Patient's PCP:No primary provider on file.  Consults: Dr Herbert Moors, MD.  Barrie Folk, MD  Discharge Diagnoses: .Rectal bleeding from jejunal source. Marland KitchenHTN (hypertension) .GERD (gastroesophageal reflux disease) .Anxiety disorder .Arthritis .Anemia Thrombocytopenia.   Medication list to be determined at d/c.      Hospital Course: This extremely pleasant gentleman was admitted with rectal bleed. A nuclear medicine scan eventually showed bleeding from the jejunum. Dr Evette Cristal has graciously followed him, and has advanced diet gradually. [name] has not noticed any bleeding for the last 2 days. Hemoglobin has hovered around 9. The thought had been for angiogram with embolization if he continued to bleed. He takes iron supplements at home. If he gets discharged without angiogram, he will need to watch for recurrence of bleeding. I will give him iron IV and instruct him to hold taking Iron supplements till GI follow up outpatient. Patient may have developed bleeding ulcer disease as he was on ASA/Nabumetone/naproxen, versus other causes of bleeding in the jejunal area, in setting of mild thrombocytopenia of unclear etiology. He has been quite anxious as he has a sicker 2 year old wife at home and is worried of recurrence of bleeding. If no more bleeding, he could be discharged in am. He should avoid NSAIDS going forward, and should  Take ppi.    Day of Discharge BP 109/53  Pulse 82  Temp(Src) 98 F (36.7 C) (Oral)  Resp 18  Ht 5\' 7"  (1.702 m)  Wt 64.9 kg (143 lb 1.3 oz)  BMI 22.41 kg/m2  SpO2 96%  Physical Exam: Not in distress. Belly soft, non tender. +BS. Lungs clear. S1S2.RRR. No murmurs.  Results for orders placed during the hospital encounter of 11/21/11 (from the past 24 hour(s))  CBC     Status: Abnormal   Collection Time   11/26/11  3:20 AM      Component Value Range   WBC 4.2  4.0 - 10.5 (K/uL)   RBC 2.71 (*) 4.22 - 5.81 (MIL/uL)   Hemoglobin 8.6 (*) 13.0 - 17.0 (g/dL)   HCT 01.0 (*) 27.2 - 52.0 (%)   MCV 90.4  78.0 - 100.0 (fL)   MCH 31.7  26.0 - 34.0 (pg)   MCHC 35.1  30.0 - 36.0 (g/dL)   RDW 53.6  64.4 - 03.4 (%)   Platelets 114 (*) 150 - 400 (K/uL)    Disposition: hopefully home tomorrow if no more bleeding. Will mobilize him today.   Follow-up Appts: Dr Evette Cristal.    Tests Needing Follow-up: CBC.  Time spent in discharge (includes decision making & examination of pt): 20 minutes  Signed: Aneudy Champlain 11/26/2011, 4:39 PM

## 2011-11-26 NOTE — Progress Notes (Signed)
[facility] Progress Note  Subjective: Feels good. Had a BM today and RN reported it was brownish green.  Objective: Vital signs in last 24 hours: Temp:  [97.7 F (36.5 C)-98.9 F (37.2 C)] 97.9 F (36.6 C) (01/22 0620) Pulse Rate:  [77-96] 78  (01/22 1119) Resp:  [16-18] 18  (01/22 0620) BP: (135-161)/(60-66) 148/60 mmHg (01/22 1119) SpO2:  [94 %-96 %] 96 % (01/22 0620) Weight change:    PE:No distress Abdomen soft non tender  Lab Results: Results for orders placed during the hospital encounter of 11/21/11 (from the past 24 hour(s))  CBC     Status: Abnormal   Collection Time   11/26/11  3:20 AM      Component Value Range   WBC 4.2  4.0 - 10.5 (K/uL)   RBC 2.71 (*) 4.22 - 5.81 (MIL/uL)   Hemoglobin 8.6 (*) 13.0 - 17.0 (g/dL)   HCT 16.1 (*) 09.6 - 52.0 (%)   MCV 90.4  78.0 - 100.0 (fL)   MCH 31.7  26.0 - 34.0 (pg)   MCHC 35.1  30.0 - 36.0 (g/dL)   RDW 04.5  40.9 - 81.1 (%)   Platelets 114 (*) 150 - 400 (K/uL)    Studies/Results: @RISRSLT24 @    Assessment: GI bleed jejunal source. Appears to have stopped  Plan:  Advance diet. Home tomorrow if stable    Ashawna Hanback F 11/26/2011, 12:28 PM

## 2011-11-27 LAB — CBC
HCT: 25.1 % — ABNORMAL LOW (ref 39.0–52.0)
Hemoglobin: 8.8 g/dL — ABNORMAL LOW (ref 13.0–17.0)
MCH: 31.8 pg (ref 26.0–34.0)
MCHC: 35.1 g/dL (ref 30.0–36.0)

## 2011-11-27 NOTE — Discharge Summary (Signed)
Physician Discharge Summary  Frank Kramer:[id number] DOB: Mar 02, 1922 DOA: 11/21/2011  PCP: Florentina Jenny, MD, MD  Admit date: 11/21/2011 Discharge date: 11/27/2011  Discharge Diagnoses:  1. GI bleed, presumed jejunal in origin 2. Anemia secondary to above; no blood products required 3. Asymmetric thickening of the left posterior wall of the urinary bladder or asymmetric indentation by enlarged prostate gland, followup as an outpatient suggested  Discharge Condition: Improved  Disposition: Home  History of present illness:  78 year old man presented to the emergency department with history of rectal bleeding. Had been taking Naprosyn as needed and aspirin daily. He was admitted for acute rectal bleeding.  Hospital Course:  The patient was admitted to the medical floor and seen in consultation with gastroenterology. Because of his history of known diverticulosis and continued bleeding a  bleeding scan was recommended. This revealed bleeding in the jejunum. Consideration was given to pursuing embolization via interventional radiology. However, bleeding appears to have stopped on approximately January 21. He has had no further bleeding and has been cleared for discharge. These and other issues as delineated below. 1. Lower GI bleed: Initially felt to be secondary to diverticulosis. However a bleeding scan suggested source as being the jejunum. No NSAIDs. Hold iron until followup. Followup with Dr. Evette Cristal in one week.  2. Anemia: Stable. No blood products required.  3. Thrombocytopenia: Stable. Followup as an outpatient.  4. Hypertension: Stable.  5. GERD: PPI therapy.  6. Anxiety disorder: Stable.  7. Asymmetric thickening of the left posterior wall of the urinary bladder or asymmetric indentation by enlarged prostate gland: Followup with urology as an outpatient as clinically indicated.  Consultants:  Gastroenterology   Physical therapy: No followup needed. No equipment  recommended  Procedures:  Nuclear medicine RBC tag study  Discharge Instructions  Discharge Orders    Future Orders Please Complete By Expires   Diet - low sodium heart healthy      Activity as tolerated - No restrictions      Discharge instructions      Comments:   Call physician for bleeding or return to emergency department for evaluation.     Medication List  As of 11/27/2011  1:38 PM   STOP taking these medications         ACETAMINOPHEN-BUTALBITAL 50-325 MG Tabs      aspirin EC 81 MG tablet      nabumetone 750 MG tablet      naproxen 500 MG tablet         TAKE these medications         ALPRAZolam 0.5 MG tablet   Commonly known as: XANAX   Take 0.5 mg by mouth at bedtime.      amLODipine 10 MG tablet   Commonly known as: NORVASC   Take 10 mg by mouth at bedtime.      carvedilol 12.5 MG tablet   Commonly known as: COREG   Take 12.5 mg by mouth 2 (two) times daily.      enalapril 20 MG tablet   Commonly known as: VASOTEC   Take 20 mg by mouth 2 (two) times daily.      omeprazole 40 MG capsule   Commonly known as: PRILOSEC   Take 40 mg by mouth daily before breakfast.      traMADol 50 MG tablet   Commonly known as: ULTRAM   Take 50 mg by mouth 2 (two) times daily. For pain.           Follow-up  Information    Follow up with Florentina Jenny, MD.   Contact information:   [address] [place]ure[place] Josephs. [address] [place] [address] [phone]       Schedule an appointment as soon as possible for a visit with Graylin Shiver, MD.   Contact information:   [address], [address] [place] [address] [phone]           The results of significant diagnostics from this hospitalization (including imaging, microbiology, ancillary and laboratory) are listed below for reference.    Significant Diagnostic Studies: Ct Abdomen Pelvis Wo Contrast  11/21/2011  **ADDENDUM** CREATED: 11/21/2011 15:28:22  Correction for impression:  Is  T11 vertebral body.  **END ADDENDUM** SIGNED BY: Natasha Mead, M.D.    11/21/2011  **ADDENDUM** CREATED: 11/21/2011 15:11:06  Clinical Data:  rectal bleeding, assess for diverticulitis/colitis;, history of lymphoma  CT ABDOMEN AND PELVIS  Technique:  Multidetector CT imaging of the abdomen and pelvis was performed following the standard protocol without intravenous contrast.  Comparison: 07/05/2011  CT ABDOMEN and pelvis without IV contrast  Findings: Lung bases shows a stable emphysematous bulla in  right lower lobe.  Old right rib fractures are stable.  Stable scattered hepatic cysts the largest in the right hepatic lobe inferiorly measures 2.2 cm.  Extensive atherosclerotic calcifications of the abdominal aorta and the iliac arteries again noted.  Unenhanced pancreas spleen and adrenal glands are unremarkable.  Oral contrast material was given to the patient.  No small bowel obstruction.  No ascites or free air.  No adenopathy.  Bilateral multiple renal cysts are stable.  No hydronephrosis or hydroureter. No nephrolithiasis.  Stable sclerotic and trabecular changes bilateral hips pelvis and T11 vertebral body.  Again this may be due to Paget disease or prior lymphomatous involvement.  Tortuous abdominal aorta again noted.  There is no pericecal inflammation.  Multiple sigmoid colon diverticula are noted without evidence of acute diverticulitis.  Again noted asymmetric thickening of the left posterior wall of the urinary bladder or asymmetric mass effect from enlarged prostate gland.  The prostate gland is enlarged measures 5.7 by 6.6 cm.  Correlation with urology exam and cystoscopy is again recommended.  There is no evidence of distal colonic obstruction.  No pericolonic abscess or fluid collection is identified.  Metallic fixation material noted right proximal femur.  IMPRESSION:  1.  Stable hepatic and bilateral multiple renal cysts.  No hydronephrosis or hydroureter. 2.  No small bowel or colonic obstruction.  3.  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  Multiple sigmoid colon diverticula are noted without definite evidence of diverticulitis.  No pericolonic fluid collection is identified. 4.  Again noted asymmetric thickening of the left posterior wall of the urinary bladder or asymmetric indentation by enlarged prostate gland.  Correlation with urology exam and cystoscopy is again recommended.  **END ADDENDUM** SIGNED BY: Natasha Mead, M.D.    11/21/2011  *RADIOLOGY REPORT*  Clinical Data:  CT ABDOMEN AND PELVIS WITHOUT CONTRAST  Technique:  Multidetector CT imaging of the abdomen and pelvis was performed following the standard protocol without intravenous contrast.  Comparison: None.  Findings:  IMPRESSION:  Original Report Authenticated By: Natasha Mead, M.D.   Nm Gi Blood Loss  11/21/2011  *RADIOLOGY REPORT*  Clinical Data: GI bleeding  NUCLEAR MEDICINE GASTROINTESTINAL BLEEDING STUDY  Technique:  Sequential abdominal images were obtained following intravenous administration of Tc-5m  labeled red blood cells.  Radiopharmaceutical: 24 mCi technetium 61m RBCs  Comparison: CT abdomen pelvis dated 11/21/2011  Findings: At approximately 40 minutes, increased radiotracer uptake is seen within a loop of small bowel in the left mid abdomen, which then peristalsis distally into the midline lower abdomen/pelvis.  This appearance is compatible with active bleeding within small bowel in the left abdomen, possibly jejunum.  IMPRESSION: Active bleeding within small bowel in the left abdomen, possibly jejunum.  Original Report Authenticated By: Charline Bills, M.D.    Microbiology: Recent Results (from the past 240 hour(s))  URINE CULTURE     Status: Normal   Collection Time   11/23/11  4:30 AM      Component Value Range Status Comment   Specimen Description URINE, CLEAN CATCH   Final    Special Requests  NONE   Final    Setup Time [id number]   Final    Colony Count 3,000 COLONIES/ML   Final    Culture INSIGNIFICANT GROWTH   Final    Report Status 11/24/2011 FINAL   Final      Labs: Basic Metabolic Panel:  Lab 11/24/11 8119 11/23/11 0400 11/22/11 0525 11/21/11 1000  NA 142 139 138 139  K 3.5 3.7 -- --  CL 112 112 110 106  CO2 23 22 22 24   GLUCOSE 98 119* 118* 121*  BUN 7 9 13 18   CREATININE 1.03 0.89 0.90 1.08  CALCIUM 8.3* 8.3* 8.2* 9.2  MG -- -- -- 2.1  PHOS -- -- -- 1.8*   Liver Function Tests:  Lab 11/22/11 0525 11/21/11 1000  AST 11 13  ALT 10 12  ALKPHOS 61 86  BILITOT 0.7 0.3  PROT 5.3* 6.9  ALBUMIN 2.7* 3.7   CBC:  Lab 11/27/11 0330 11/26/11 0320 11/24/11 1552 11/24/11 0334 11/23/11 0400  WBC 4.2 4.2 4.7 4.4 4.6  NEUTROABS -- -- 2.8 -- --  HGB 8.8* 8.6* 9.0* 9.0* 9.8*  HCT 25.1* 24.5* 25.4* 25.4* 27.3*  MCV 90.6 90.4 90.4 90.4 89.2  PLT 121* 114* 110* 106* 110*    Time coordinating discharge: 35 minutes.  Signed:  Brendia Sacks, MD  [facility] 11/27/2011, 1:38 PM

## 2011-11-27 NOTE — Evaluation (Signed)
Physical Therapy Evaluation Patient Details Name: Frank Kramer MRN: [id number] DOB: February 17, 1922 Today's Date: 11/27/2011 11:15- 11:45 EVI  No follow up PT or DME needed  Problem List:  Patient Active Problem List  Diagnoses  . Rectal bleeding  . HTN (hypertension)  . GERD (gastroesophageal reflux disease)  . Anxiety disorder  . Arthritis  . Anemia    Past Medical History:  Past Medical History  Diagnosis Date  . Skin cancer   . Arthritis   . Hypertension   . High cholesterol   . GERD (gastroesophageal reflux disease)   . Panic     panic disorder  . Non Hodgkin's lymphoma    Past Surgical History:  Past Surgical History  Procedure Date  . Back surgery   . Neck surgery     c2  . Skin biopsy   . Hernia repair   . Prostatectomy     PT Assessment/Plan/Recommendation PT Assessment Clinical Impression Statement: pt with some deconditioning, but safe in moblility with RW.  Anticipate he will continue to improve as he continues to recover at home.  Pt and son in law instructed in exercise and to to do frequent short bouts of activity throughout the day to increase strength and pt reminded to pay attention to his posture PT Recommendation/Assessment: Patent does not need any further PT services No Skilled PT: Patient will have necessary level of assist by caregiver at discharge;Patient is modified independent with all activity/mobility PT Recommendation Recommendations for Other Services: Other (comment) (none) Follow Up Recommendations: No PT follow up Equipment Recommended: None recommended by PT PT Goals     PT Evaluation Precautions/Restrictions  Precautions Required Braces or Orthoses: No Restrictions Weight Bearing Restrictions: No Prior Functioning  Home Living Lives With: Family Receives Help From: Family Type of Home: House Home Layout: One level Home Access: Ramped entrance Home Adaptive Equipment: Straight cane;Walker - rolling Prior Function Level  of Independence: Independent with gait Cognition Cognition Arousal/Alertness: Awake/alert Overall Cognitive Status: Appears within functional limits for tasks assessed Orientation Level: Oriented X4 Sensation/Coordination Coordination Gross Motor Movements are Fluid and Coordinated: Yes Fine Motor Movements are Fluid and Coordinated: Yes Extremity Assessment RLE Assessment RLE Assessment: Within Functional Limits LLE Assessment LLE Assessment: Within Functional Limits Mobility (including Balance) Bed Mobility Bed Mobility: Yes Supine to Sit: 7: Independent Sit to Supine: 7: Independent Transfers Transfers: Yes Sit to Stand: 7: Independent Stand to Sit: 7: Independent Ambulation/Gait Ambulation/Gait: Yes Ambulation/Gait Assistance: 6: Modified independent (Device/Increase time) Ambulation Distance (Feet): 500 Feet Assistive device: Rolling walker Gait Pattern: Step-through pattern Gait velocity: WFL Stairs: No Wheelchair Mobility Wheelchair Mobility: No  Posture/Postural Control Posture/Postural Control: No significant limitations Balance Balance Assessed: No Exercise  Other Exercises Other Exercises: standing hip abduction, standing heel raise, standing U/E hip to hip, step forward and step to for balance End of Session PT - End of Session Equipment Utilized During Treatment: Other (comment) (RW) Activity Tolerance: Patient tolerated treatment well Patient left: in bed;with family/visitor present General Behavior During Session: [facility] for tasks performed Cognition: [facility] for tasks performed  Donnetta Hail 11/27/2011, 12:00 PM

## 2011-11-27 NOTE — Progress Notes (Signed)
Pt d'c home with son. Will take pt to car in wheelchair. Pt signed own d'c papers. All questions explained to patient.

## 2011-11-27 NOTE — Progress Notes (Signed)
Talked to patient about DCP; plans to return home with spouse, per Physical Therapy note - no HHC needed, patient has been up ambulating hallway with difficulty; PCP is Dr Redmond School and meds are filled by a mail order service; Abelino Derrick RN, BSN, MHA

## 2011-11-27 NOTE — Progress Notes (Signed)
Patient had a small loose greenish brown bowel movement, no blood noted in BSC. Patient ambulated with staff in hallway around 2045 without difficulty except "not realizing how stiff" his legs were.

## 2011-11-27 NOTE — Progress Notes (Signed)
[facility] Progress Note  Subjective: The patient is doing well today and he tolerated a regular diet. There is no sign of further bleeding. His hemoglobin has remained stable.  Objective: Vital signs in last 24 hours: Temp:  [97.8 F (36.6 C)-98 F (36.7 C)] 98 F (36.7 C) (01/23 0503) Pulse Rate:  [78-89] 89  (01/23 0503) Resp:  [16-18] 16  (01/23 0503) BP: (109-148)/(53-65) 132/65 mmHg (01/23 0503) SpO2:  [95 %-96 %] 95 % (01/23 0503) Weight change:    PE: He is in no distress  Abdomen soft nontender  Lab Results: Results for orders placed during the hospital encounter of 11/21/11 (from the past 24 hour(s))  CBC     Status: Abnormal   Collection Time   11/27/11  3:30 AM      Component Value Range   WBC 4.2  4.0 - 10.5 (K/uL)   RBC 2.77 (*) 4.22 - 5.81 (MIL/uL)   Hemoglobin 8.8 (*) 13.0 - 17.0 (g/dL)   HCT 62.9 (*) 52.8 - 52.0 (%)   MCV 90.6  78.0 - 100.0 (fL)   MCH 31.8  26.0 - 34.0 (pg)   MCHC 35.1  30.0 - 36.0 (g/dL)   RDW 41.3  24.4 - 01.0 (%)   Platelets 121 (*) 150 - 400 (K/uL)    Studies/Results: @RISRSLT24 @    Assessment: GI bleed from jejunal source which has resolved  Plan: Okay for discharge today with followup in my office in about a week    Mariaclara Spear F 11/27/2011, 9:29 AM

## 2011-11-27 NOTE — Progress Notes (Signed)
PROGRESS NOTE  Frank Kramer ZOX:[id number] DOB: 1922/09/11 DOA: 11/21/2011 PCP: Florentina Jenny, MD, MD  Brief narrative: 78 year old man presented to the emergency department with history of rectal bleeding. Had been taking Naprosyn as needed and aspirin daily. He was admitted for acute rectal bleeding.  The patient was admitted to the medical floor and seen in consultation with gastroenterology. Because of his history of known diverticulosis and continued bleeding a  bleeding scan was recommended. This revealed bleeding in the jejunum. Consideration was given to pursuing embolization via interventional radiology. However, bleeding appears to have stopped on approximately January 21. He has had no further bleeding has been cleared for discharge.  Past medical history: Colon polyps, diverticulosis of sigmoid colon, hypertension, hyperlipidemia, panic disorder, non-Hodgkin's lymphoma  Consultants:  Gastroenterology  Physical therapy: No followup needed. No equipment recommended  Procedures:  Nuclear medicine RBC tag study:  Antibiotics:  None  Interim History: Chart reviewed in detail. Subjective: Feels fine. No bleeding. Ready to go home.  Objective: Filed Vitals:   11/26/11 1415 11/26/11 2045 11/26/11 2055 11/27/11 0503  BP: 109/53  146/63 132/65  Pulse: 82 80 87 89  Temp: 98 F (36.7 C)  97.8 F (36.6 C) 98 F (36.7 C)  TempSrc: Oral  Oral Oral  Resp: 18  16 16   Height:      Weight:      SpO2: 96%  95% 95%    Intake/Output Summary (Last 24 hours) at 11/27/11 1307 Last data filed at 11/27/11 1000  Gross per 24 hour  Intake 1207.42 ml  Output    550 ml  Net 657.42 ml    Exam:  General: Appears calm and comfortable. Cardiovascular: Regular rate and rhythm. No murmur, rub or gallop. No lower extremity edema. Respiratory: Clear to auscultation bilaterally. No wheezes, rales or rhonchi. Normal respiratory effort.  Data Reviewed: Basic Metabolic Panel:  Lab  11/24/11 0334 11/23/11 0400 11/22/11 0525 11/21/11 1000  NA 142 139 138 139  K 3.5 3.7 -- --  CL 112 112 110 106  CO2 23 22 22 24   GLUCOSE 98 119* 118* 121*  BUN 7 9 13 18   CREATININE 1.03 0.89 0.90 1.08  CALCIUM 8.3* 8.3* 8.2* 9.2  MG -- -- -- 2.1  PHOS -- -- -- 1.8*   Liver Function Tests:  Lab 11/22/11 0525 11/21/11 1000  AST 11 13  ALT 10 12  ALKPHOS 61 86  BILITOT 0.7 0.3  PROT 5.3* 6.9  ALBUMIN 2.7* 3.7   CBC:  Lab 11/27/11 0330 11/26/11 0320 11/24/11 1552 11/24/11 0334 11/23/11 0400  WBC 4.2 4.2 4.7 4.4 4.6  NEUTROABS -- -- 2.8 -- --  HGB 8.8* 8.6* 9.0* 9.0* 9.8*  HCT 25.1* 24.5* 25.4* 25.4* 27.3*  MCV 90.6 90.4 90.4 90.4 89.2  PLT 121* 114* 110* 106* 110*    Recent Results (from the past 240 hour(s))  URINE CULTURE     Status: Normal   Collection Time   11/23/11  4:30 AM      Component Value Range Status Comment   Specimen Description URINE, CLEAN CATCH   Final    Special Requests NONE   Final    Setup Time [id number]   Final    Colony Count 3,000 COLONIES/ML   Final    Culture INSIGNIFICANT GROWTH   Final    Report Status 11/24/2011 FINAL   Final      Studies: Ct Abdomen Pelvis Wo Contrast  11/21/2011  **ADDENDUM** CREATED: 11/21/2011 15:28:22  Correction  for impression:  Is T11 vertebral body.  **END ADDENDUM** SIGNED BY: Natasha Mead, M.D.    11/21/2011  **ADDENDUM** CREATED: 11/21/2011 15:11:06  Clinical Data:  rectal bleeding, assess for diverticulitis/colitis;, history of lymphoma  CT ABDOMEN AND PELVIS  Technique:  Multidetector CT imaging of the abdomen and pelvis was performed following the standard protocol without intravenous contrast.  Comparison: 07/05/2011  CT ABDOMEN and pelvis without IV contrast  Findings: Lung bases shows a stable emphysematous bulla in  right lower lobe.  Old right rib fractures are stable.  Stable scattered hepatic cysts the largest in the right hepatic lobe inferiorly measures 2.2 cm.  Extensive atherosclerotic calcifications  of the abdominal aorta and the iliac arteries again noted.  Unenhanced pancreas spleen and adrenal glands are unremarkable.  Oral contrast material was given to the patient.  No small bowel obstruction.  No ascites or free air.  No adenopathy.  Bilateral multiple renal cysts are stable.  No hydronephrosis or hydroureter. No nephrolithiasis.  Stable sclerotic and trabecular changes bilateral hips pelvis and T11 vertebral body.  Again this may be due to Paget disease or prior lymphomatous involvement.  Tortuous abdominal aorta again noted.  There is no pericecal inflammation.  Multiple sigmoid colon diverticula are noted without evidence of acute diverticulitis.  Again noted asymmetric thickening of the left posterior wall of the urinary bladder or asymmetric mass effect from enlarged prostate gland.  The prostate gland is enlarged measures 5.7 by 6.6 cm.  Correlation with urology exam and cystoscopy is again recommended.  There is no evidence of distal colonic obstruction.  No pericolonic abscess or fluid collection is identified.  Metallic fixation material noted right proximal femur.  IMPRESSION:  1.  Stable hepatic and bilateral multiple renal cysts.  No hydronephrosis or hydroureter. 2.  No small bowel or colonic obstruction. 3.  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  Multiple sigmoid colon diverticula are noted without definite evidence of diverticulitis.  No pericolonic fluid collection is identified. 4.  Again noted asymmetric thickening of the left posterior wall of the urinary bladder or asymmetric indentation by enlarged prostate gland.  Correlation with urology exam and cystoscopy is again recommended.  **END ADDENDUM** SIGNED BY: Natasha Mead, M.D.    11/21/2011  *RADIOLOGY REPORT*  Clinical Data:  CT ABDOMEN AND PELVIS WITHOUT CONTRAST  Technique:  Multidetector CT imaging of the abdomen  and pelvis was performed following the standard protocol without intravenous contrast.  Comparison: None.  Findings:  IMPRESSION:  Original Report Authenticated By: Natasha Mead, M.D.   Nm Gi Blood Loss  11/21/2011  *RADIOLOGY REPORT*  Clinical Data: GI bleeding  NUCLEAR MEDICINE GASTROINTESTINAL BLEEDING STUDY  Technique:  Sequential abdominal images were obtained following intravenous administration of Tc-44m labeled red blood cells.  Radiopharmaceutical: 24 mCi technetium 34m RBCs  Comparison: CT abdomen pelvis dated 11/21/2011  Findings: At approximately 40 minutes, increased radiotracer uptake is seen within a loop of small bowel in the left mid abdomen, which then peristalsis distally into the midline lower abdomen/pelvis.  This appearance is compatible with active bleeding within small bowel in the left abdomen, possibly jejunum.  IMPRESSION: Active bleeding within small bowel in the left abdomen, possibly jejunum.  Original Report Authenticated By: Charline Bills, M.D.    Scheduled Meds:   . ALPRAZolam  0.5 mg Oral BID  . amLODipine  10 mg Oral QHS  . antiseptic  oral rinse  15 mL Mouth Rinse BID  . carvedilol  12.5 mg Oral BID  . ferric gluconate (FERRLECIT/NULECIT) Test Dose  25 mg Intravenous Once  . pantoprazole  40 mg Oral BID AC  . DISCONTD: nabumetone  750 mg Oral BID   Continuous Infusions:   . DISCONTD: pantoprozole (PROTONIX) infusion 8 mg/hr (11/26/11 0757)     Assessment/Plan: 1. Lower GI bleed: Initially felt to be secondary to diverticulosis. However a bleeding scan suggested source as being the jejunum. No NSAIDs. Hold iron until followup. Followup with Dr. Evette Cristal in one week. 2. Anemia: Stable. No blood products required. 3. Thrombocytopenia: Stable. Followup as an outpatient. 4. Hypertension: Stable. 5. GERD: PPI therapy. 6. Anxiety disorder: Stable. 7. Asymmetric thickening of the left posterior wall of the urinary bladder or asymmetric indentation by enlarged  prostate gland: Followup with urology as an outpatient as clinically indicated.   Family Communication: Discussed with son at bedside Disposition Plan: Home today.   Brendia Sacks, MD  [facility] Pager [phone] 11/27/2011, 1:07 PM    LOS: 6 days

## 2012-02-11 ENCOUNTER — Other Ambulatory Visit: Payer: Medicare Other | Admitting: Lab

## 2012-02-11 ENCOUNTER — Ambulatory Visit: Payer: Medicare Other | Admitting: Oncology

## 2012-02-18 ENCOUNTER — Encounter: Payer: Self-pay | Admitting: Oncology

## 2012-02-18 ENCOUNTER — Ambulatory Visit: Payer: Medicare Other | Admitting: Oncology

## 2012-02-18 NOTE — Progress Notes (Signed)
The patient failed to report for his visit today. He is usually quite compliant. My guess is that he did not note at this appointment. He will be rescheduled. He has a remote history of high-grade lymphoma likely cured. 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 He has chronic urologic problems. He has Paget's disease of the bone.

## 2012-03-10 ENCOUNTER — Ambulatory Visit: Payer: Medicare Other

## 2012-03-20 ENCOUNTER — Other Ambulatory Visit: Payer: Self-pay | Admitting: Family Medicine

## 2012-03-20 ENCOUNTER — Ambulatory Visit
Admission: RE | Admit: 2012-03-20 | Discharge: 2012-03-20 | Disposition: A | Discharge status: Home / Self Care | Payer: Medicare Other | Source: Ambulatory Visit | Attending: Family Medicine | Admitting: Family Medicine

## 2012-03-20 DIAGNOSIS — M25476 Effusion, unspecified foot: Secondary | ICD-10-CM

## 2012-03-20 DIAGNOSIS — M25473 Effusion, unspecified ankle: Secondary | ICD-10-CM

## 2012-03-24 ENCOUNTER — Telehealth: Payer: Self-pay | Admitting: Oncology

## 2012-03-24 NOTE — Telephone Encounter (Signed)
Talked to pt. R/s appt that pt missed from April 2013 to Aug. MD and lab before that, pt aware of appt

## 2012-05-29 ENCOUNTER — Other Ambulatory Visit: Payer: Medicare Other | Admitting: Lab

## 2012-05-29 LAB — CBC WITH DIFFERENTIAL/PLATELET
BASO%: 0.4 % (ref 0.0–2.0)
EOS%: 1 % (ref 0.0–7.0)
LYMPH%: 20.2 % (ref 14.0–49.0)
MCH: 33 pg (ref 27.2–33.4)
MCHC: 35.1 g/dL (ref 32.0–36.0)
MCV: 94 fL (ref 79.3–98.0)
MONO%: 13.8 % (ref 0.0–14.0)
Platelets: 113 10*3/uL — ABNORMAL LOW (ref 140–400)
RBC: 3.94 10*6/uL — ABNORMAL LOW (ref 4.20–5.82)

## 2012-06-01 ENCOUNTER — Ambulatory Visit: Payer: Medicare Other | Admitting: Oncology

## 2012-06-02 LAB — KAPPA/LAMBDA LIGHT CHAINS: Kappa free light chain: 11.6 mg/dL — ABNORMAL HIGH (ref 0.33–1.94)

## 2012-06-02 LAB — COMPREHENSIVE METABOLIC PANEL
ALT: 13 U/L (ref 0–53)
AST: 14 U/L (ref 0–37)
Alkaline Phosphatase: 68 U/L (ref 39–117)
Glucose, Bld: 114 mg/dL — ABNORMAL HIGH (ref 70–99)
Sodium: 140 mEq/L (ref 135–145)
Total Bilirubin: 0.6 mg/dL (ref 0.3–1.2)
Total Protein: 6.5 g/dL (ref 6.0–8.3)

## 2012-06-02 LAB — IMMUNOFIXATION ELECTROPHORESIS
IgG (Immunoglobin G), Serum: 1270 mg/dL (ref 650–1600)
Total Protein, Serum Electrophoresis: 6.5 g/dL (ref 6.0–8.3)

## 2012-06-05 ENCOUNTER — Ambulatory Visit (HOSPITAL_BASED_OUTPATIENT_CLINIC_OR_DEPARTMENT_OTHER): Payer: Medicare Other | Admitting: Oncology

## 2012-06-05 ENCOUNTER — Telehealth: Payer: Self-pay | Admitting: Oncology

## 2012-06-05 VITALS — BP 124/60 | HR 73 | Temp 97.0°F | Resp 18 | Ht 67.0 in | Wt 141.5 lb

## 2012-06-05 DIAGNOSIS — D472 Monoclonal gammopathy: Secondary | ICD-10-CM

## 2012-06-05 DIAGNOSIS — I10 Essential (primary) hypertension: Secondary | ICD-10-CM

## 2012-06-05 DIAGNOSIS — Z87898 Personal history of other specified conditions: Secondary | ICD-10-CM

## 2012-06-05 DIAGNOSIS — M889 Osteitis deformans of unspecified bone: Secondary | ICD-10-CM

## 2012-06-05 DIAGNOSIS — J310 Chronic rhinitis: Secondary | ICD-10-CM

## 2012-06-05 DIAGNOSIS — C8510 Unspecified B-cell lymphoma, unspecified site: Secondary | ICD-10-CM

## 2012-06-05 NOTE — Telephone Encounter (Signed)
Gave pt appt for February 2014 lab and MD °

## 2012-06-07 ENCOUNTER — Encounter: Payer: Self-pay | Admitting: Oncology

## 2012-06-07 DIAGNOSIS — D472 Monoclonal gammopathy: Secondary | ICD-10-CM

## 2012-06-07 DIAGNOSIS — J310 Chronic rhinitis: Secondary | ICD-10-CM | POA: Insufficient documentation

## 2012-06-07 HISTORY — DX: Monoclonal gammopathy: D47.2

## 2012-06-07 HISTORY — DX: Chronic rhinitis: J31.0

## 2012-06-08 NOTE — Progress Notes (Signed)
CC:   Dibas Docia Chuck, MD Bertram Millard. Dahlstedt, M.D.  Followup visit for this pleasant 78 year old man with a remote history of high-grade B-cell non-Hodgkin's lymphoma treated before the Rituxan era with [facility] chemotherapy.  He achieved a complete and durable response and is likely cured.  Initial diagnosis was in 1997.  In August 2004 he developed a monoclonal gammopathy of undetermined significance.  This has been followed for the last 9 years and has shown no signs of transformation to multiple myeloma.  Lab done in anticipation of today's visit on July 30 does show a rise in kappa serum free light chains up to 11.6 mg percent compared with 1.57 in October of 2012.  Total serum immunoglobulins remain normal but there is a monoclonal IgG kappa paraprotein on immunofixation electrophoresis. Hemoglobin remained stable at 13 g which is actually much better than his baseline.  Mild thrombocytopenia which is stable over time, current value 113,000.  Mild leukopenia with white count 3600 compared with 4200 done back in January of this year.  Normal white count differential except for increased monocyte percentage of 14.  Chemistry profile with BUN 14, creatinine 1.2 which is stable.  He continues to have urologic problems with frequent visits to his urologist who has been kind enough to keep me updated.  He has had recurrent urinary tract infections, recurrent hematuria related to this. He has benign prostatic hyperplasia and this may be adding to his problem with recurrent infection.  Dr. Retta Diones also feels that some of the decongestants he has been using may be causing some urinary retention.  Urine cultures done last month were negative after he completed a course of Cipro.  His only other new complaint is recurrent rhinitis with continuous running of liquid into his nasopharynx which is making him uncomfortable and causing sleep disturbance.  He says that I gave him a  decongestant about 3 years ago that really worked very well, better than anything else he has taken.  I looked back in the records and the only thing I see is Zyrtec and he does not believe that this was the drug.  I mentioned Sudafed, and he thinks that perhaps this was the decongestant that he was taking so I gave him another prescription for this.  PHYSICAL EXAMINATION:  General:  A thin Caucasian man in no distress. Vital signs:  Blood pressure is 124/60, pulse is 73 and regular, respirations 18, temperature 97.  Weight 141.5 pounds, stable.  Head and neck:  Normal.  Lungs:  Clear and resonant to percussion.  Heart: Regular cardiac rhythm.  No murmur.  No lymphadenopathy.  Abdomen: Soft, nontender, no mass, no organomegaly.  Extremities:  No edema.  No calf tenderness.  Neurologic:  Motor strength 5/5.  Reflexes 1+ symmetric.  Sensation intact to vibration sense over the fingertips by tuning fork exam.  IMPRESSION: 1. Cured high-grade lymphoma. 2. IgG kappa monoclonal gammopathy of undetermined significance.     Significance of rise in his serum free light chain is unclear since     his total serum immunoglobulins and hematologic profile remain     stable and if anything his hemoglobin is higher than his baseline.     I will continue to monitor him on an every 6 months basis.  Prior     to the next visit we will check a 24 urine total protein and     immunofixation electrophoresis. 3. Benign prostatic hyperplasia, see discussion above. 4. Rhinitis.  Not clear whether this is  allergic.  I am going to put     him on Sudafed to see if this helps.  I specifically wrote down     that he should not take Sudafed Plus which has ephedrine in it in     view of his hypertension. 5. Essential hypertension. 6. History of nephrolithiasis. 7. Paget's disease of the bone.    ______________________________ Levert Feinstein, M.D., F.A.C.P. JMG/MEDQ  D:  06/05/2012  T:  06/05/2012  Job:   [id number]

## 2012-06-19 ENCOUNTER — Encounter: Payer: Self-pay | Admitting: Dietician

## 2012-06-19 NOTE — Progress Notes (Signed)
Brief Out-patient Oncology Nutrition Note  Reason: Patient Screened Positive For Nutrition Risk For Unintentional Weight Loss and Decreased Appetite.   Frank Kramer is an 78 year old male patient of Dr. Cyndie Chime, diagnosed with lymphoma. Contacted patient via telephone for positive nutrition risk. He reported his appetite is ok. He reported he eats what he can. He stated he lost a lot of weight after his wife died, but has gained back about 5 lb.Marland Kitchen He was not interested in speaking with the RD, but he took RD contact information for future nutrition questions or concerns.   Wt Readings from Last 10 Encounters:  06/05/12 141 lb 8 oz (64.184 kg)  11/21/11 143 lb 1.3 oz (64.9 kg)    RD available for nutrition needs.   Frank Finn, MS, RD, LDN [phone]

## 2012-06-22 ENCOUNTER — Telehealth: Payer: Self-pay | Admitting: Oncology

## 2012-06-22 NOTE — Telephone Encounter (Signed)
Pt aware of appt for 12/18/12 lab and MD

## 2012-09-11 ENCOUNTER — Encounter (HOSPITAL_COMMUNITY): Payer: Self-pay | Admitting: Emergency Medicine

## 2012-09-11 ENCOUNTER — Emergency Department (HOSPITAL_COMMUNITY): Payer: Medicare Other

## 2012-09-11 ENCOUNTER — Emergency Department (HOSPITAL_COMMUNITY)
Admission: EM | Admit: 2012-09-11 | Discharge: 2012-09-11 | Disposition: A | Discharge status: Home / Self Care | Payer: Medicare Other | Attending: Emergency Medicine | Admitting: Emergency Medicine

## 2012-09-11 DIAGNOSIS — Z85828 Personal history of other malignant neoplasm of skin: Secondary | ICD-10-CM | POA: Insufficient documentation

## 2012-09-11 DIAGNOSIS — S22080A Wedge compression fracture of T11-T12 vertebra, initial encounter for closed fracture: Secondary | ICD-10-CM

## 2012-09-11 DIAGNOSIS — M889 Osteitis deformans of unspecified bone: Secondary | ICD-10-CM

## 2012-09-11 DIAGNOSIS — M8448XA Pathological fracture, other site, initial encounter for fracture: Secondary | ICD-10-CM | POA: Insufficient documentation

## 2012-09-11 DIAGNOSIS — Z79899 Other long term (current) drug therapy: Secondary | ICD-10-CM | POA: Insufficient documentation

## 2012-09-11 DIAGNOSIS — M129 Arthropathy, unspecified: Secondary | ICD-10-CM | POA: Insufficient documentation

## 2012-09-11 DIAGNOSIS — K219 Gastro-esophageal reflux disease without esophagitis: Secondary | ICD-10-CM | POA: Insufficient documentation

## 2012-09-11 DIAGNOSIS — I10 Essential (primary) hypertension: Secondary | ICD-10-CM | POA: Insufficient documentation

## 2012-09-11 DIAGNOSIS — Z87891 Personal history of nicotine dependence: Secondary | ICD-10-CM | POA: Insufficient documentation

## 2012-09-11 DIAGNOSIS — E7800 Pure hypercholesterolemia, unspecified: Secondary | ICD-10-CM | POA: Insufficient documentation

## 2012-09-11 DIAGNOSIS — Z9889 Other specified postprocedural states: Secondary | ICD-10-CM | POA: Insufficient documentation

## 2012-09-11 DIAGNOSIS — M5430 Sciatica, unspecified side: Secondary | ICD-10-CM | POA: Insufficient documentation

## 2012-09-11 DIAGNOSIS — F410 Panic disorder [episodic paroxysmal anxiety] without agoraphobia: Secondary | ICD-10-CM | POA: Insufficient documentation

## 2012-09-11 HISTORY — DX: Osteitis deformans of unspecified bone: M88.9

## 2012-09-11 MED ORDER — OXYCODONE-ACETAMINOPHEN 5-325 MG PO TABS
2.0000 | ORAL_TABLET | Freq: Once | ORAL | Status: AC
  Administered 2012-09-11: 2 via ORAL
  Filled 2012-09-11: qty 2

## 2012-09-11 MED ORDER — NAPROXEN 500 MG PO TABS: 500.0000 mg | ORAL_TABLET | Freq: Two times a day (BID) | ORAL | Status: DC

## 2012-09-11 MED ORDER — IBUPROFEN 800 MG PO TABS
800.0000 mg | ORAL_TABLET | Freq: Once | ORAL | Status: AC
  Administered 2012-09-11: 800 mg via ORAL
  Filled 2012-09-11: qty 1

## 2012-09-11 MED ORDER — OXYCODONE-ACETAMINOPHEN 5-325 MG PO TABS: 2.0000 | ORAL_TABLET | ORAL | Status: DC | PRN

## 2012-09-11 NOTE — ED Notes (Signed)
WUJ:[id number]<JY> Expected date:09/11/12<BR> Expected time: 8:34 AM<BR> Means of arrival:Ambulance<BR> Comments:<BR> Leg pain

## 2012-09-11 NOTE — ED Notes (Signed)
Pt more comfortable on R side

## 2012-09-11 NOTE — ED Provider Notes (Signed)
History     CSN: [id number]  Arrival date & time 09/11/12  0845   First MD Initiated Contact with Patient 09/11/12 0901      Chief Complaint  Patient presents with  . Hip Pain  . Leg Pain    (Consider location/radiation/quality/duration/timing/severity/associated sxs/prior treatment) The history is provided by the patient.  GLENDEN DANBURY is a 78 y.o. male hx of arthritis, paget's disease here with back pain radiating down R leg. Started with R sided back pain since yesterday. Today, it radiate down R leg. No injury or trauma. He has a hx of paget's disease but is not currently getting treatment. He denies incontinence or numbness or weakness. No fevers. Took some hydrocodone but not feeling better.     Past Medical History  Diagnosis Date  . Skin cancer   . Arthritis   . Hypertension   . High cholesterol   . GERD (gastroesophageal reflux disease)   . Panic     panic disorder  . Non Hodgkin's lymphoma   . MGUS (monoclonal gammopathy of unknown significance) 06/07/2012  . Rhinitis 06/07/2012  . Paget's bone disease     Past Surgical History  Procedure Date  . Back surgery   . Neck surgery     c2  . Skin biopsy   . Hernia repair   . Prostatectomy     History reviewed. No pertinent family history.  History  Substance Use Topics  . Smoking status: Former Smoker    Quit date: 11/20/1968  . Smokeless tobacco: Never Used  . Alcohol Use: No      Review of Systems  Musculoskeletal: Positive for back pain.       R leg pain   All other systems reviewed and are negative.    Allergies  Aspirin; Codeine; Iohexol; Macrodantin; and Morphine and related  Home Medications   Current Outpatient Rx  Name  Route  Sig  Dispense  Refill  . ALPRAZOLAM 0.5 MG PO TABS   Oral   Take 0.5 mg by mouth at bedtime.         Marland Kitchen AMLODIPINE BESYLATE 10 MG PO TABS   Oral   Take 10 mg by mouth at bedtime.         . AMOXICILLIN 500 MG PO CAPS   Oral   Take 500 mg by mouth  daily.         Marland Kitchen BUTALBITAL-APAP-CAFFEINE 50-325-40 MG PO TABS   Oral   Take 1 tablet by mouth 2 (two) times daily as needed. For headaches.         Marland Kitchen CARVEDILOL 12.5 MG PO TABS   Oral   Take 12.5 mg by mouth 2 (two) times daily.         . ENALAPRIL MALEATE 20 MG PO TABS   Oral   Take 20 mg by mouth 2 (two) times daily.         Marland Kitchen FERROUS SULFATE 325 (65 FE) MG PO TABS   Oral   Take 325 mg by mouth 2 (two) times daily.          Marland Kitchen HYDROCODONE-ACETAMINOPHEN 7.5-500 MG PO TABS   Oral   Take 1 tablet by mouth every 6 (six) hours as needed. For pain.         Marland Kitchen NABUMETONE 750 MG PO TABS   Oral   Take 750 mg by mouth 2 (two) times daily.         Marland Kitchen OMEPRAZOLE 40 MG PO  CPDR   Oral   Take 40 mg by mouth daily before breakfast.         . CALCIUM POLYCARBOPHIL 625 MG PO TABS   Oral   Take 625 mg by mouth daily.         Marland Kitchen PRAVASTATIN SODIUM 20 MG PO TABS   Oral   Take 20 mg by mouth 3 (three) times a week.          Marland Kitchen TAMSULOSIN HCL 0.4 MG PO CAPS   Oral   Take 0.4 mg by mouth daily.         . TRAMADOL HCL 50 MG PO TABS   Oral   Take 50 mg by mouth 2 (two) times daily. For pain.           BP 148/73  Pulse 103  Temp 97.5 F (36.4 C) (Oral)  Resp 18  SpO2 98%  Physical Exam  Nursing note and vitals reviewed. Constitutional: He is oriented to person, place, and time. He appears well-developed and well-nourished.       Uncomfortable   HENT:  Head: Normocephalic.  Mouth/Throat: Oropharynx is clear and moist.  Eyes: Conjunctivae normal are normal. Pupils are equal, round, and reactive to light.  Neck: Normal range of motion. Neck supple.  Cardiovascular: Normal rate, regular rhythm and normal heart sounds.   Pulmonary/Chest: Effort normal and breath sounds normal. No respiratory distress. He has no wheezes. He has no rales.  Abdominal: Soft. Bowel sounds are normal. He exhibits no distension. There is no tenderness.  Musculoskeletal: Normal range  of motion.       R paralumbar muscle tenderness, no midline tenderness. + straight leg raise on R. Nl sensation and motor otherwise. 2+ pulses bilaterally.   Neurological: He is alert and oriented to person, place, and time. No cranial nerve deficit.       No saddle anesthesia   Skin: Skin is warm and dry.  Psychiatric: He has a normal mood and affect. His behavior is normal. Judgment and thought content normal.    ED Course  Procedures (including critical care time)  Labs Reviewed - No data to display Dg Lumbar Spine Complete  09/11/2012  *RADIOLOGY REPORT*  Clinical Data: Low back pain  LUMBAR SPINE - COMPLETE 4+ VIEW  Comparison: CT 11/21/2011  Findings: There is normal alignment of the lumbar vertebral bodies. There is joint space narrowing at L3-L4 are unchanged from comparison CT.  There is anterior osteophytosis at T12-L3.  There is expansion of the T11 vertebral body with coarse central trabeculation.  There is loss vertebral body height at this level. These findings are also unchanged from comparison CT.  Oblique projections demonstrate no pars fracture.  IMPRESSION:  1.  Stable exam compared to the CT 11/21/2011 2.  Expansion of T11 vertebral body may represent Paget's disease. There is loss vertebral body height at this level unchanged from prior consistent with compression fracture. 3.  Multiple levels of disc osteophytic disease again unchanged.   Original Report Authenticated By: Genevive Bi, M.D.    Dg Hip Complete Right  09/11/2012  *RADIOLOGY REPORT*  Clinical Data: Back pain.  History lymphoma  RIGHT HIP - COMPLETE 2+ VIEW  Comparison: CT 11/21/2011  Findings: There is internal fixation of a right hip fracture. There is coarse trabeculation throughout the bones of the pelvis, sacrum, and proximal left and right femur.  This unchanged from prior.  No evidence of acute fracture of the pelvis or sacrum. Dedicated views  of the right hip demonstrate no fracture.  IMPRESSION:  1.   Diffuse sclerosis and trabeculation within the bones the pelvis consistent with Paget's disease. 2.  Internal fixation of right hip fracture without complication.  3.  No  acute osseous abnormality identified.   Original Report Authenticated By: Genevive Bi, M.D.      1. Wedge compression fracture of T11 vertebra   2. Paget disease of bone   3. Sciatica       MDM  VIAAN KNIPPENBERG is a 78 y.o. male here with back pain down R leg. Likely sciatica. Given hx of paget's disease, will get lumbar xray to r/o compressing mass. No saddle anesthesia to suggest spinal cord compression. Will give pain meds and reassess.   10:29 AM Pain improved. I will give him NSAIDs, percocet, and have him f/u with PMD for physical therapy. Xrays showed compression fracture of T11 that is old. No new fractures.         Richardean Canal, MD 09/11/12 1030

## 2012-09-11 NOTE — ED Notes (Signed)
Pt states he woke up this morning with severe R lumbar pain that radiates down to R knee.  Denies any recent injuries.  R pedal pulse strong, sensation present, cap refill under 2 seconds.

## 2012-12-04 ENCOUNTER — Inpatient Hospital Stay (HOSPITAL_COMMUNITY)
Admission: EM | Admit: 2012-12-04 | Discharge: 2012-12-08 | DRG: 372 | Disposition: A | Discharge status: Home Health | Payer: Medicare Other | Attending: Internal Medicine | Admitting: Internal Medicine

## 2012-12-04 ENCOUNTER — Ambulatory Visit (HOSPITAL_COMMUNITY): Payer: Medicare Other

## 2012-12-04 ENCOUNTER — Encounter (HOSPITAL_COMMUNITY): Payer: Self-pay | Admitting: Emergency Medicine

## 2012-12-04 DIAGNOSIS — K559 Vascular disorder of intestine, unspecified: Secondary | ICD-10-CM | POA: Diagnosis present

## 2012-12-04 DIAGNOSIS — F419 Anxiety disorder, unspecified: Secondary | ICD-10-CM

## 2012-12-04 DIAGNOSIS — M129 Arthropathy, unspecified: Secondary | ICD-10-CM

## 2012-12-04 DIAGNOSIS — I10 Essential (primary) hypertension: Secondary | ICD-10-CM

## 2012-12-04 DIAGNOSIS — C8589 Other specified types of non-Hodgkin lymphoma, extranodal and solid organ sites: Secondary | ICD-10-CM | POA: Diagnosis present

## 2012-12-04 DIAGNOSIS — K922 Gastrointestinal hemorrhage, unspecified: Secondary | ICD-10-CM | POA: Diagnosis present

## 2012-12-04 DIAGNOSIS — D649 Anemia, unspecified: Secondary | ICD-10-CM

## 2012-12-04 DIAGNOSIS — D630 Anemia in neoplastic disease: Secondary | ICD-10-CM | POA: Diagnosis present

## 2012-12-04 DIAGNOSIS — M1990 Unspecified osteoarthritis, unspecified site: Secondary | ICD-10-CM

## 2012-12-04 DIAGNOSIS — F411 Generalized anxiety disorder: Secondary | ICD-10-CM

## 2012-12-04 DIAGNOSIS — K529 Noninfective gastroenteritis and colitis, unspecified: Secondary | ICD-10-CM

## 2012-12-04 DIAGNOSIS — M889 Osteitis deformans of unspecified bone: Secondary | ICD-10-CM

## 2012-12-04 DIAGNOSIS — Z79899 Other long term (current) drug therapy: Secondary | ICD-10-CM

## 2012-12-04 DIAGNOSIS — K625 Hemorrhage of anus and rectum: Secondary | ICD-10-CM

## 2012-12-04 DIAGNOSIS — K219 Gastro-esophageal reflux disease without esophagitis: Secondary | ICD-10-CM

## 2012-12-04 DIAGNOSIS — J310 Chronic rhinitis: Secondary | ICD-10-CM

## 2012-12-04 DIAGNOSIS — A0472 Enterocolitis due to Clostridium difficile, not specified as recurrent: Principal | ICD-10-CM | POA: Diagnosis present

## 2012-12-04 DIAGNOSIS — E7800 Pure hypercholesterolemia, unspecified: Secondary | ICD-10-CM | POA: Diagnosis present

## 2012-12-04 DIAGNOSIS — D472 Monoclonal gammopathy: Secondary | ICD-10-CM

## 2012-12-04 DIAGNOSIS — K5289 Other specified noninfective gastroenteritis and colitis: Secondary | ICD-10-CM

## 2012-12-04 LAB — CBC WITH DIFFERENTIAL/PLATELET
Basophils Absolute: 0 10*3/uL (ref 0.0–0.1)
HCT: 36 % — ABNORMAL LOW (ref 39.0–52.0)
Lymphocytes Relative: 23 % (ref 12–46)
Neutro Abs: 3.1 10*3/uL (ref 1.7–7.7)
Neutrophils Relative %: 67 % (ref 43–77)
Platelets: 118 10*3/uL — ABNORMAL LOW (ref 150–400)
RDW: 14 % (ref 11.5–15.5)
WBC: 4.6 10*3/uL (ref 4.0–10.5)

## 2012-12-04 LAB — BASIC METABOLIC PANEL
CO2: 27 mEq/L (ref 19–32)
Chloride: 103 mEq/L (ref 96–112)
GFR calc Af Amer: 83 mL/min — ABNORMAL LOW (ref >90)
Potassium: 3.8 mEq/L (ref 3.5–5.1)
Sodium: 137 mEq/L (ref 135–145)

## 2012-12-04 LAB — HEMOGLOBIN AND HEMATOCRIT, BLOOD
HCT: 33.4 % — ABNORMAL LOW (ref 39.0–52.0)
Hemoglobin: 11.5 g/dL — ABNORMAL LOW (ref 13.0–17.0)

## 2012-12-04 LAB — HEPATIC FUNCTION PANEL
ALT: 15 U/L (ref 0–53)
Bilirubin, Direct: 0.1 mg/dL (ref 0.0–0.3)
Indirect Bilirubin: 0.5 mg/dL (ref 0.3–0.9)
Total Bilirubin: 0.6 mg/dL (ref 0.3–1.2)

## 2012-12-04 LAB — TYPE AND SCREEN: Antibody Screen: NEGATIVE

## 2012-12-04 LAB — OCCULT BLOOD, POC DEVICE: Fecal Occult Bld: POSITIVE — AB

## 2012-12-04 LAB — PROTIME-INR: INR: 1.11 (ref 0.00–1.49)

## 2012-12-04 LAB — FIBRINOGEN: Fibrinogen: 264 mg/dL (ref 204–475)

## 2012-12-04 MED ORDER — CALCIUM POLYCARBOPHIL 625 MG PO TABS
625.0000 mg | ORAL_TABLET | Freq: Every day | ORAL | Status: DC
  Administered 2012-12-04 – 2012-12-06 (×3): 625 mg via ORAL
  Filled 2012-12-04 (×4): qty 1

## 2012-12-04 MED ORDER — FLUOCINONIDE 0.05 % EX OINT
1.0000 "application " | TOPICAL_OINTMENT | Freq: Two times a day (BID) | CUTANEOUS | Status: DC
  Administered 2012-12-05 – 2012-12-08 (×5): 1 via TOPICAL
  Filled 2012-12-04: qty 15

## 2012-12-04 MED ORDER — VITAMIN D3 25 MCG (1000 UNIT) PO TABS
1000.0000 [IU] | ORAL_TABLET | Freq: Every day | ORAL | Status: DC
  Administered 2012-12-04 – 2012-12-08 (×5): 1000 [IU] via ORAL
  Filled 2012-12-04 (×5): qty 1

## 2012-12-04 MED ORDER — METRONIDAZOLE IN NACL 5-0.79 MG/ML-% IV SOLN
500.0000 mg | Freq: Three times a day (TID) | INTRAVENOUS | Status: DC
  Administered 2012-12-04 – 2012-12-08 (×12): 500 mg via INTRAVENOUS
  Filled 2012-12-04 (×13): qty 100

## 2012-12-04 MED ORDER — ONDANSETRON HCL 4 MG PO TABS: 4.0000 mg | ORAL_TABLET | Freq: Four times a day (QID) | ORAL | Status: DC | PRN

## 2012-12-04 MED ORDER — ALBUTEROL SULFATE (5 MG/ML) 0.5% IN NEBU: 2.5000 mg | INHALATION_SOLUTION | RESPIRATORY_TRACT | Status: DC | PRN

## 2012-12-04 MED ORDER — CIPROFLOXACIN IN D5W 400 MG/200ML IV SOLN
400.0000 mg | Freq: Two times a day (BID) | INTRAVENOUS | Status: DC
  Filled 2012-12-04: qty 200

## 2012-12-04 MED ORDER — ENALAPRIL MALEATE 20 MG PO TABS
20.0000 mg | ORAL_TABLET | Freq: Two times a day (BID) | ORAL | Status: DC
  Administered 2012-12-05 – 2012-12-08 (×7): 20 mg via ORAL
  Filled 2012-12-04 (×9): qty 1

## 2012-12-04 MED ORDER — SIMVASTATIN 5 MG PO TABS
5.0000 mg | ORAL_TABLET | Freq: Every day | ORAL | Status: DC
  Administered 2012-12-04 – 2012-12-07 (×4): 5 mg via ORAL
  Filled 2012-12-04 (×5): qty 1

## 2012-12-04 MED ORDER — ONDANSETRON HCL 4 MG/2ML IJ SOLN: 4.0000 mg | Freq: Four times a day (QID) | INTRAMUSCULAR | Status: DC | PRN

## 2012-12-04 MED ORDER — CARVEDILOL 12.5 MG PO TABS
12.5000 mg | ORAL_TABLET | Freq: Two times a day (BID) | ORAL | Status: DC
  Administered 2012-12-04 – 2012-12-08 (×8): 12.5 mg via ORAL
  Filled 2012-12-04 (×11): qty 1

## 2012-12-04 MED ORDER — POTASSIUM CHLORIDE IN NACL 20-0.9 MEQ/L-% IV SOLN
INTRAVENOUS | Status: DC
  Administered 2012-12-04: 75 mL/h via INTRAVENOUS
  Filled 2012-12-04 (×2): qty 1000

## 2012-12-04 MED ORDER — VANCOMYCIN 50 MG/ML ORAL SOLUTION
125.0000 mg | Freq: Four times a day (QID) | ORAL | Status: DC
  Administered 2012-12-04 – 2012-12-08 (×16): 125 mg via ORAL
  Filled 2012-12-04 (×19): qty 2.5

## 2012-12-04 MED ORDER — SODIUM CHLORIDE 0.9 % IJ SOLN
3.0000 mL | Freq: Two times a day (BID) | INTRAMUSCULAR | Status: DC
  Administered 2012-12-05 – 2012-12-06 (×3): 3 mL via INTRAVENOUS

## 2012-12-04 MED ORDER — BUTALBITAL-APAP-CAFFEINE 50-325-40 MG PO TABS
1.0000 | ORAL_TABLET | Freq: Four times a day (QID) | ORAL | Status: DC | PRN
  Administered 2012-12-05: 1 via ORAL
  Filled 2012-12-04: qty 1

## 2012-12-04 MED ORDER — ADULT MULTIVITAMIN W/MINERALS CH
1.0000 | ORAL_TABLET | Freq: Every day | ORAL | Status: DC
  Administered 2012-12-04 – 2012-12-08 (×5): 1 via ORAL
  Filled 2012-12-04 (×5): qty 1

## 2012-12-04 MED ORDER — ALPRAZOLAM 0.5 MG PO TABS
0.5000 mg | ORAL_TABLET | Freq: Three times a day (TID) | ORAL | Status: DC | PRN
  Administered 2012-12-04 – 2012-12-07 (×7): 0.5 mg via ORAL
  Filled 2012-12-04 (×7): qty 1

## 2012-12-04 MED ORDER — PANTOPRAZOLE SODIUM 40 MG PO TBEC
40.0000 mg | DELAYED_RELEASE_TABLET | Freq: Two times a day (BID) | ORAL | Status: DC
  Administered 2012-12-04 – 2012-12-08 (×8): 40 mg via ORAL
  Filled 2012-12-04 (×9): qty 1

## 2012-12-04 MED ORDER — AMLODIPINE BESYLATE 10 MG PO TABS
10.0000 mg | ORAL_TABLET | Freq: Every day | ORAL | Status: DC
  Administered 2012-12-05 – 2012-12-07 (×3): 10 mg via ORAL
  Filled 2012-12-04 (×5): qty 1

## 2012-12-04 MED ORDER — METRONIDAZOLE 500 MG PO TABS: 500.0000 mg | ORAL_TABLET | Freq: Three times a day (TID) | ORAL | Status: DC

## 2012-12-04 MED ORDER — HYDROCODONE-ACETAMINOPHEN 5-325 MG PO TABS
1.0000 | ORAL_TABLET | ORAL | Status: DC | PRN
  Administered 2012-12-05 – 2012-12-06 (×4): 1 via ORAL
  Administered 2012-12-07: 2 via ORAL
  Administered 2012-12-07 (×2): 1 via ORAL
  Administered 2012-12-08: 2 via ORAL
  Filled 2012-12-04 (×3): qty 1
  Filled 2012-12-04: qty 2
  Filled 2012-12-04: qty 1
  Filled 2012-12-04: qty 2
  Filled 2012-12-04 (×2): qty 1

## 2012-12-04 MED ORDER — GUAIFENESIN-DM 100-10 MG/5ML PO SYRP: 5.0000 mL | ORAL_SOLUTION | ORAL | Status: DC | PRN

## 2012-12-04 MED ORDER — TAMSULOSIN HCL 0.4 MG PO CAPS
0.4000 mg | ORAL_CAPSULE | Freq: Every day | ORAL | Status: DC
  Administered 2012-12-04 – 2012-12-07 (×4): 0.4 mg via ORAL
  Filled 2012-12-04 (×5): qty 1

## 2012-12-04 MED ORDER — FERROUS SULFATE 325 (65 FE) MG PO TABS
325.0000 mg | ORAL_TABLET | Freq: Every day | ORAL | Status: DC
  Administered 2012-12-04 – 2012-12-06 (×3): 325 mg via ORAL
  Filled 2012-12-04 (×3): qty 1

## 2012-12-04 NOTE — ED Provider Notes (Signed)
Medical screening examination/treatment/procedure(s) were conducted as a shared visit with non-physician practitioner(s) and myself.  I personally evaluated the patient during the encounter  Patient seen and examined. Has left lower quadrant pain with rectal bleeding. History of diverticulitis for the past. Will obtain abdominal CT and patient likely to be admitted  Toy Baker, MD 12/04/12 1118

## 2012-12-04 NOTE — ED Notes (Signed)
PA going in to give pt and update, pt very anxious

## 2012-12-04 NOTE — H&P (Signed)
[facility]                                                                                    Patient Demographics  Frank Kramer, is a 79 y.o. male  CSN: [id number]  MRN: [id number]  DOB - [birth year]  Admit Date - 12/04/2012  Outpatient Primary MD for the patient is Florentina Jenny, MD   With History of -  Past Medical History  Diagnosis Date  . Arthritis   . Hypertension   . High cholesterol   . GERD (gastroesophageal reflux disease)   . Panic     panic disorder  . MGUS (monoclonal gammopathy of unknown significance) 06/07/2012  . Rhinitis 06/07/2012  . Paget's bone disease   . Skin cancer   . Non Hodgkin's lymphoma dx'd 1997      Past Surgical History  Procedure Date  . Back surgery   . Neck surgery     c2  . Skin biopsy   . Hernia repair   . Prostatectomy     in for   Chief Complaint  Patient presents with  . Rectal Bleeding     HPI  Frank Kramer  is a 79 y.o. male, with history of treated lymphoma, HX disease, MGUS, small bowel GI bleed one year ago who follows with Dr. Evette Cristal and has had a stable colonoscopy 5 years ago per patient, who took a 1 week course of Cipro few weeks ago for a bacterial infection presents with one-day history of blood in stool, patient says that he has been in decubitus stress for the last 1 month in regards to his wife being ill who herself was hospitalized for a month and discharged yesterday, patient was doing fine until this morning when he started noticing that he had frank blood coming in his stool, he had one bowel movement this morning which was frankly bloody with no stool in it, he said he lost between 1500 cc with this bowel movement, she then came to the ER where a CT of the abdomen showed pancolitis, while I went to see him he had about 52 to 100 cc of bloody bowel movement in the bedside commode.  She denies any exposure to sick contacts, denies any fever chills or abdominal pain, denies any nausea or vomiting,  denies eating any bad foods and any other relatives having similar symptoms. His current presentation is suggestive of C. Difficile light is versus ischemic colitis.    Review of Systems    In addition to the HPI above,  No Fever-chills, No Headache, No changes with Vision or hearing, No problems swallowing food or Liquids, No Chest pain, Cough or Shortness of Breath, No Abdominal pain, No Nausea or Vommitting, Bowel movements are loose and have blood in it starting this morning. No Blood in stool or Urine, No dysuria, No new skin rashes or bruises, No new joints pains-aches,  No new weakness, tingling, numbness in any extremity, No recent weight gain or loss, No polyuria, polydypsia or polyphagia, No significant Mental Stressors.  A full 10 point Review of Systems was done, except as stated above, all other Review of Systems were negative.  Social History History  Substance Use Topics  . Smoking status: Former Smoker    Quit date: 11/20/1968  . Smokeless tobacco: Never Used  . Alcohol Use: No     Family History No family history of colon cancer that he can remember  Prior to Admission medications   Medication Sig Start Date End Date Taking? Authorizing Provider  alendronate (FOSAMAX) 70 MG tablet Take 70 mg by mouth every 7 (seven) days. Take with a full glass of water on an empty stomach. Takes on Saturdays.   Yes Historical Provider, MD  ALPRAZolam Prudy Feeler) 0.5 MG tablet Take 0.5 mg by mouth 2 (two) times daily as needed. anxiety   Yes Historical Provider, MD  amLODipine (NORVASC) 10 MG tablet Take 10 mg by mouth at bedtime.   Yes Historical Provider, MD  butalbital-acetaminophen-caffeine (FIORICET, ESGIC) 50-325-40 MG per tablet Take 1 tablet by mouth 2 (two) times daily as needed. For headaches.   Yes Historical Provider, MD  carvedilol (COREG) 12.5 MG tablet Take 12.5 mg by mouth 2 (two) times daily.   Yes Historical Provider, MD  cholecalciferol (VITAMIN D) 1000  UNITS tablet Take 1,000 Units by mouth daily.   Yes Historical Provider, MD  enalapril (VASOTEC) 20 MG tablet Take 20 mg by mouth 2 (two) times daily.   Yes Historical Provider, MD  ferrous sulfate 325 (65 FE) MG tablet Take 325 mg by mouth daily.    Yes Historical Provider, MD  fluocinonide ointment (LIDEX) 0.05 % Apply 1 application topically 2 (two) times daily. rash   Yes Historical Provider, MD  HYDROcodone-acetaminophen (LORTAB) 7.5-500 MG per tablet Take 1 tablet by mouth every 6 (six) hours as needed. For pain.   Yes Historical Provider, MD  Multiple Vitamin (MULTIVITAMIN WITH MINERALS) TABS Take 1 tablet by mouth daily.   Yes Historical Provider, MD  omeprazole (PRILOSEC) 40 MG capsule Take 40 mg by mouth daily before breakfast.   Yes Historical Provider, MD  polycarbophil (FIBERCON) 625 MG tablet Take 625 mg by mouth daily.   Yes Historical Provider, MD  pravastatin (PRAVACHOL) 20 MG tablet Take 20 mg by mouth 3 (three) times a week. Takes on Mon, Wed, Fri. 06/01/12  Yes Historical Provider, MD  silodosin (RAPAFLO) 8 MG CAPS capsule Take 8 mg by mouth daily with breakfast.   Yes Historical Provider, MD    Allergies  Allergen Reactions  . Aspirin     GI bleeding  . Codeine Other (See Comments)    Patient cannot recall reaction  . Iohexol      Desc: HAS SEIZURES WITH X-RAY DYE-GIVEN 120 MG SOLU0MEDROL, 25 MG BENADRYL, AND 25 MG PEPCID 1 HOUR PRIOR TO EXAM AND HAD NO PROBLEMS-ARS-08/15/07   . Macrodantin Other (See Comments)    Patient cannot recall reaction  . Morphine And Related Itching    Physical Exam  Vitals  Blood pressure 151/70, pulse 86, temperature 97.9 F (36.6 C), temperature source Oral, resp. rate 18, SpO2 95.00%.   1. General frail elderly white male lying in bed in NAD,     2. Normal affect and insight, Not Suicidal or Homicidal, Awake Alert, Oriented X 3.  3. No F.N deficits, ALL C.Nerves Intact, Strength 5/5 all 4 extremities, Sensation intact all 4  extremities, Plantars down going.  4. Ears and Eyes appear Normal, Conjunctivae clear, PERRLA. Moist Oral Mucosa.  5. Supple Neck, No JVD, No cervical lymphadenopathy appriciated, No Carotid Bruits.  6. Symmetrical Chest wall movement, Good air movement bilaterally, CTAB.  7. RRR, No Gallops, Rubs or Murmurs, No Parasternal Heave.  8. Positive Bowel Sounds, Abdomen Soft, Non tender, No organomegaly appriciated,No rebound -guarding or rigidity. He has some stool in the bed side commode which appears bloody about 50 cc.  9.  No Cyanosis, Normal Skin Turgor, No Skin Rash or Bruise.  10. Good muscle tone,  joints appear normal , no effusions, Normal ROM.  11. No Palpable Lymph Nodes in Neck or Axillae     Data Review  CBC  Lab 12/04/12 1053  WBC 4.6  HGB 12.5*  HCT 36.0*  PLT 118*  MCV 95.7  MCH 33.2  MCHC 34.7  RDW 14.0  LYMPHSABS 1.1  MONOABS 0.4  EOSABS 0.1  BASOSABS 0.0  BANDABS --   ------------------------------------------------------------------------------------------------------------------  Chemistries   Lab 12/04/12 1053  NA 137  K 3.8  CL 103  CO2 27  GLUCOSE 101*  BUN 18  CREATININE 0.97  CALCIUM 9.4  MG --  AST 17  ALT 15  ALKPHOS 61  BILITOT 0.6   ------------------------------------------------------------------------------------------------------------------ CrCl is unknown because both a height and weight (above a minimum accepted value) are required for this calculation. ------------------------------------------------------------------------------------------------------------------ No results found for this basename: TSH,T4TOTAL,FREET3,T3FREE,THYROIDAB in the last 72 hours   Coagulation profile No results found for this basename: INR:5,PROTIME:5 in the last 168 hours ------------------------------------------------------------------------------------------------------------------- No results found for this basename: DDIMER:2 in the  last 72 hours -------------------------------------------------------------------------------------------------------------------  Cardiac Enzymes No results found for this basename: CK:3,CKMB:3,TROPONINI:3,MYOGLOBIN:3 in the last 168 hours ------------------------------------------------------------------------------------------------------------------ No components found with this basename: POCBNP:3   ---------------------------------------------------------------------------------------------------------------  Urinalysis    Component Value Date/Time   COLORURINE YELLOW 11/23/2011 0430   APPEARANCEUR CLOUDY* 11/23/2011 0430   LABSPEC 1.013 11/23/2011 0430   PHURINE 6.5 11/23/2011 0430   GLUCOSEU NEGATIVE 11/23/2011 0430   HGBUR LARGE* 11/23/2011 0430   BILIRUBINUR NEGATIVE 11/23/2011 0430   KETONESUR NEGATIVE 11/23/2011 0430   PROTEINUR NEGATIVE 11/23/2011 0430   UROBILINOGEN 0.2 11/23/2011 0430   NITRITE NEGATIVE 11/23/2011 0430   LEUKOCYTESUR LARGE* 11/23/2011 0430    ----------------------------------------------------------------------------------------------------------------  Imaging results:   Ct Abdomen Pelvis Wo Contrast  12/04/2012  *RADIOLOGY REPORT*  Clinical Data: Rectal bleeding.  Non-Hodgkin's lymphoma.  CT ABDOMEN AND PELVIS WITHOUT CONTRAST  Technique:  Multidetector CT imaging of the abdomen and pelvis was performed following the standard protocol without intravenous contrast.  Comparison: CT scan dated 11/21/2011 and 07/05/2011  Findings: There is edema of the mucosa of the cecum and proximal ascending colon.  There is also diffuse edema of the mucosa of the descending colon extending into the proximal sigmoid portion of the colon.  This is consistent with colitis.  In this patient in this distribution, ischemic colitis should be considered.  The transverse colon appears normal.  There are numerous diverticula throughout the colon particularly in the sigmoid region.  There  are multiple benign-appearing renal and hepatic cysts, unchanged.  Spleen and pancreas and adrenal glands are normal. Dilatation of the abdominal aorta to a maximal diameter of 2.7 cm.  Again noted is asymmetric enlargement of the prostate gland with thickening of the bladder wall asymmetrically, unchanged.  Again noted are chronic changes in the pelvic bones and proximal femurs as well as in the T11 vertebral body.  These are stable.  IMPRESSION: Edema of the mucosa of the entire descending colon.  There is also edema in the cecum and proximal ascending colon.  Findings consistent with colitis.  Ischemic colitis should be considered particularly in a patient of  this age.   Original Report Authenticated By: Francene Boyers, M.D.          Assessment & Plan     1. Blood per rectum with likely lower GI bleed - CT suggestive of pancolitis, positive recent exposure to Cipro, likely C. difficile colitis versus ischemic colitis - plan is to admit the patient, rule out C. difficile, by mouth Cipro, monitor H&H, type and screen, have discussed the case with Dr. Rae Lips GI who will see him shortly. We'll keep him on clear liquids for now.   2. History of lymphoma which has been under remission, Paget's disease, MGUS - no acute issues patient will follow post discharge with Dr. Cyndie Chime.   3. History of GERD, dyslipidemia, hypertension - no acute issues continue PPI, and statin, Norvasc, Coreg along with enalapril.    DVT Prophylaxis   SCDs    AM Labs Ordered, also please review Full Orders  Family Communication: Admission, patients condition and plan of care including tests being ordered have been discussed with the patient   who indicates understanding and agree with the plan and Code Status.  Code Status full  Likely DC to  home  Time spent in minutes : 35  Condition GUARDED   Leroy Sea M.D on 12/04/2012 at 2:59 PM  Between 7am to 7pm - Pager - [phone]  After 7pm go to  [email or web address] - password [email or web address]  And look for the night coverage person covering me after hours  [facility] Office  [phone]

## 2012-12-04 NOTE — Progress Notes (Signed)
Patient is c diff positive.

## 2012-12-04 NOTE — ED Notes (Signed)
Pt complaining of being cold, all thermostats adjusted, warm blankets provided, and bed moved to other side of room due to draft.

## 2012-12-04 NOTE — ED Provider Notes (Signed)
History     CSN: [id number]  Arrival date & time 12/04/12  1001   First MD Initiated Contact with Patient 12/04/12 1021      Chief Complaint  Patient presents with  . Rectal Bleeding    (Consider location/radiation/quality/duration/timing/severity/associated sxs/prior treatment) HPI Comments: 79 y.o. male presents today complaining of acute onset BRBPR since this morning. PMHx significant for diverticulitis for which he presented similarly a year ago. States he has been to the bathroom 8 times this morning each time with gross BRBPR.  Pt states the last thing he ate last night contained nuts and he was told by one of his doctors to not eat nuts. Pt admits LLQ abdominal pain, localized, severe. Deep palpation makes the pain worse; nothing made the pain better. Pt took no interventions. Pt called Dr. Boris Lown (his PCP) and was told to come here. Denies hemorrhoids, nausea, vomiting, rectal pain, fever, dizziness, SOB, chest pain.  PMH signficant for enlarged prostate, non-Hodgkins lymphoma, Paget's dz. Pt ambulates with a cane.      Patient is a 79 y.o. male presenting with hematochezia.  Rectal Bleeding  Associated symptoms include abdominal pain. Pertinent negatives include no fever, no diarrhea, no nausea, no rectal pain, no vomiting, no chest pain, no headaches and no rash.    Past Medical History  Diagnosis Date  . Skin cancer   . Arthritis   . Hypertension   . High cholesterol   . GERD (gastroesophageal reflux disease)   . Panic     panic disorder  . Non Hodgkin's lymphoma   . MGUS (monoclonal gammopathy of unknown significance) 06/07/2012  . Rhinitis 06/07/2012  . Paget's bone disease     Past Surgical History  Procedure Date  . Back surgery   . Neck surgery     c2  . Skin biopsy   . Hernia repair   . Prostatectomy     No family history on file.  History  Substance Use Topics  . Smoking status: Former Smoker    Quit date: 11/20/1968  . Smokeless tobacco:  Never Used  . Alcohol Use: No      Review of Systems  Constitutional: Negative for fever and diaphoresis.  HENT: Negative for neck pain and neck stiffness.   Eyes: Negative for visual disturbance.  Respiratory: Negative for apnea, chest tightness and shortness of breath.   Cardiovascular: Negative for chest pain and palpitations.  Gastrointestinal: Positive for abdominal pain, blood in stool and hematochezia. Negative for nausea, vomiting, diarrhea, constipation and rectal pain.       Left lower quadrant  Genitourinary: Negative for dysuria.       Hx of enlarged prostate, diff urinating, urgency, dribbling, nocturia  Musculoskeletal: Positive for gait problem.       Right leg weaker, ambulates with cane  Skin: Negative for rash.  Neurological: Negative for dizziness, weakness, light-headedness, numbness and headaches.    Allergies  Aspirin; Codeine; Iohexol; Macrodantin; and Morphine and related  Home Medications   Current Outpatient Rx  Name  Route  Sig  Dispense  Refill  . ALENDRONATE SODIUM 70 MG PO TABS   Oral   Take 70 mg by mouth every 7 (seven) days. Take with a full glass of water on an empty stomach. Takes on Saturdays.         . ALPRAZOLAM 0.5 MG PO TABS   Oral   Take 0.5 mg by mouth 2 (two) times daily as needed. anxiety         .  AMLODIPINE BESYLATE 10 MG PO TABS   Oral   Take 10 mg by mouth at bedtime.         Marland Kitchen BUTALBITAL-APAP-CAFFEINE 50-325-40 MG PO TABS   Oral   Take 1 tablet by mouth 2 (two) times daily as needed. For headaches.         Marland Kitchen CARVEDILOL 12.5 MG PO TABS   Oral   Take 12.5 mg by mouth 2 (two) times daily.         Marland Kitchen VITAMIN D 1000 UNITS PO TABS   Oral   Take 1,000 Units by mouth daily.         . ENALAPRIL MALEATE 20 MG PO TABS   Oral   Take 20 mg by mouth 2 (two) times daily.         Marland Kitchen FERROUS SULFATE 325 (65 FE) MG PO TABS   Oral   Take 325 mg by mouth daily.          Marland Kitchen FLUOCINONIDE 0.05 % EX OINT   Topical    Apply 1 application topically 2 (two) times daily. rash         . HYDROCODONE-ACETAMINOPHEN 7.5-500 MG PO TABS   Oral   Take 1 tablet by mouth every 6 (six) hours as needed. For pain.         . ADULT MULTIVITAMIN W/MINERALS CH   Oral   Take 1 tablet by mouth daily.         Marland Kitchen OMEPRAZOLE 40 MG PO CPDR   Oral   Take 40 mg by mouth daily before breakfast.         . CALCIUM POLYCARBOPHIL 625 MG PO TABS   Oral   Take 625 mg by mouth daily.         Marland Kitchen PRAVASTATIN SODIUM 20 MG PO TABS   Oral   Take 20 mg by mouth 3 (three) times a week. Takes on Mon, Wed, Fri.         Marland Kitchen SILODOSIN 8 MG PO CAPS   Oral   Take 8 mg by mouth daily with breakfast.           BP 138/89  Pulse 101  Temp 97.9 F (36.6 C) (Oral)  Resp 18  SpO2 97%  Physical Exam  Nursing note and vitals reviewed. Constitutional: He is oriented to person, place, and time. He appears well-developed and well-nourished. No distress.  HENT:  Head: Normocephalic and atraumatic.  Eyes: Conjunctivae normal and EOM are normal.  Neck: Normal range of motion. Neck supple.       No meningeal signs  Cardiovascular: Normal rate, regular rhythm and normal heart sounds.  Exam reveals no gallop and no friction rub.   No murmur heard. Pulmonary/Chest: Effort normal and breath sounds normal. No respiratory distress. He has no wheezes. He has no rales. He exhibits no tenderness.  Abdominal: Soft. Bowel sounds are normal. He exhibits no distension. There is tenderness. There is no rebound and no guarding.       Tenderness to light palpation LLQ  Genitourinary: Guaiac positive stool.       Stool in the vault. Good rectal tone.  Musculoskeletal: Normal range of motion. He exhibits no edema and no tenderness.       FROM, good grip strength, Right lower extremity weakness is baseline, otherwise 5/5 throughout  Neurological: He is alert and oriented to person, place, and time. No cranial nerve deficit.       No new focal  deficits.   Skin: Skin is warm and dry. He is not diaphoretic. No erythema.    ED Course  Procedures (including critical care time)  Labs Reviewed  OCCULT BLOOD, POC DEVICE - Abnormal; Notable for the following:    Fecal Occult Bld POSITIVE (*)     All other components within normal limits  CBC WITH DIFFERENTIAL  BASIC METABOLIC PANEL   No results found.  Results for orders placed during the hospital encounter of 12/04/12  CBC WITH DIFFERENTIAL      Component Value Range   WBC 4.6  4.0 - 10.5 K/uL   RBC 3.76 (*) 4.22 - 5.81 MIL/uL   Hemoglobin 12.5 (*) 13.0 - 17.0 g/dL   HCT 16.1 (*) 09.6 - 04.5 %   MCV 95.7  78.0 - 100.0 fL   MCH 33.2  26.0 - 34.0 pg   MCHC 34.7  30.0 - 36.0 g/dL   RDW 40.9  81.1 - 91.4 %   Platelets 118 (*) 150 - 400 K/uL   Neutrophils Relative 67  43 - 77 %   Neutro Abs 3.1  1.7 - 7.7 K/uL   Lymphocytes Relative 23  12 - 46 %   Lymphs Abs 1.1  0.7 - 4.0 K/uL   Monocytes Relative 9  3 - 12 %   Monocytes Absolute 0.4  0.1 - 1.0 K/uL   Eosinophils Relative 1  0 - 5 %   Eosinophils Absolute 0.1  0.0 - 0.7 K/uL   Basophils Relative 0  0 - 1 %   Basophils Absolute 0.0  0.0 - 0.1 K/uL  BASIC METABOLIC PANEL      Component Value Range   Sodium 137  135 - 145 mEq/L   Potassium 3.8  3.5 - 5.1 mEq/L   Chloride 103  96 - 112 mEq/L   CO2 27  19 - 32 mEq/L   Glucose, Bld 101 (*) 70 - 99 mg/dL   BUN 18  6 - 23 mg/dL   Creatinine, Ser 7.82  0.50 - 1.35 mg/dL   Calcium 9.4  8.4 - 95.6 mg/dL   GFR calc non Af Amer 72 (*) >90 mL/min   GFR calc Af Amer 83 (*) >90 mL/min  OCCULT BLOOD, POC DEVICE      Component Value Range   Fecal Occult Bld POSITIVE (*) NEGATIVE  FIBRINOGEN      Component Value Range   Fibrinogen 264  204 - 475 mg/dL  TYPE AND SCREEN      Component Value Range   ABO/RH(D) O POS     Antibody Screen NEG     Sample Expiration 12/07/2012    HEPATIC FUNCTION PANEL      Component Value Range   Total Protein 6.9  6.0 - 8.3 g/dL   Albumin 4.0   3.5 - 5.2 g/dL   AST 17  0 - 37 U/L   ALT 15  0 - 53 U/L   Alkaline Phosphatase 61  39 - 117 U/L   Total Bilirubin 0.6  0.3 - 1.2 mg/dL   Bilirubin, Direct 0.1  0.0 - 0.3 mg/dL   Indirect Bilirubin 0.5  0.3 - 0.9 mg/dL   Ct Abdomen Pelvis Wo Contrast  12/04/2012  *RADIOLOGY REPORT*  Clinical Data: Rectal bleeding.  Non-Hodgkin's lymphoma.  CT ABDOMEN AND PELVIS WITHOUT CONTRAST  Technique:  Multidetector CT imaging of the abdomen and pelvis was performed following the standard protocol without intravenous contrast.  Comparison: CT scan dated 11/21/2011 and 07/05/2011  Findings: There is edema of the mucosa of the cecum and proximal ascending colon.  There is also diffuse edema of the mucosa of the descending colon extending into the proximal sigmoid portion of the colon.  This is consistent with colitis.  In this patient in this distribution, ischemic colitis should be considered.  The transverse colon appears normal.  There are numerous diverticula throughout the colon particularly in the sigmoid region.  There are multiple benign-appearing renal and hepatic cysts, unchanged.  Spleen and pancreas and adrenal glands are normal. Dilatation of the abdominal aorta to a maximal diameter of 2.7 cm.  Again noted is asymmetric enlargement of the prostate gland with thickening of the bladder wall asymmetrically, unchanged.  Again noted are chronic changes in the pelvic bones and proximal femurs as well as in the T11 vertebral body.  These are stable.  IMPRESSION: Edema of the mucosa of the entire descending colon.  There is also edema in the cecum and proximal ascending colon.  Findings consistent with colitis.  Ischemic colitis should be considered particularly in a patient of this age.   Original Report Authenticated By: Francene Boyers, M.D.    Diagnosis: ischemic colitis    MDM  LLQ pain with hx of diverticulitis. Clinical picture supports that dx. Slightly tachycardic at 101 and borderline hypertensive at  138/89. Lab work shows Hgb at 12.5 and HCT at 36.  Waiting on imaging to confirm. Imaging from last year (11/21/11) showed active bleeding in the small bowel, possibly jejunum, and multiple sigmoid colon diverticula without definite evidence of diverticulitis  Today's imaging show findings consistent with colitis.  Remarks include that ischemic colitis should be considered particularly in a patient of this age. Pt will be admitted.      Glade Nurse, PA-C 12/04/12 1458

## 2012-12-04 NOTE — ED Notes (Signed)
Per patient, rectal bleeding this am with BM-was treated a year ago for same issue-has been to bathroom 8 times today, bright red blood-no dizziness, no SOB

## 2012-12-04 NOTE — Progress Notes (Signed)
Dr. Thedore Mins made aware for positive cdiff result and frequent bright bloody stools. Ordered to call MD on call if Hbg will be  below 8 tonight and to hold CiproIV and to call GI Md. Paged Dr. Ewing Schlein is on call for GI.

## 2012-12-04 NOTE — Consult Note (Signed)
Referring Provider: Dr. Thedore Mins Primary Care Physician:  Florentina Jenny, MD Primary Gastroenterologist:  Dr. Evette Cristal  Reason for Consultation:  Rectal bleeding  HPI: Frank Kramer is a 79 y.o. male with history of GI bleeding (last in 2013) presents with acute onset of bright red blood per rectum that has occurred multiple times at home and has occurred 10 times in the past hour according to the nurse. Stools have been loose with these bleeding episodes. Denies any associated abdominal pain, N/V/dizziness/lightheadedness. Has felt fine until this morning when the bleeding started. Noncontrast CT shows edematous mucosa of the cecum and proximal ascending colon and also of the descending and proximal sigmoid colon consistent with colitis. Colonoscopy in 2008 showed diverticulosis. GI bleed in 2013 had similar presentation and bleeding scan at that time showed bleeding in small bowel possibly in jejunum. He was managed conservatively during that hospitalization. Son-in-law in the ER room with him during my evaluation. BRB noted in bedside commode.       Past Medical History  Diagnosis Date  . Arthritis   . Hypertension   . High cholesterol   . GERD (gastroesophageal reflux disease)   . Panic     panic disorder  . MGUS (monoclonal gammopathy of unknown significance) 06/07/2012  . Rhinitis 06/07/2012  . Paget's bone disease   . Skin cancer   . Non Hodgkin's lymphoma dx'd 1997    Past Surgical History  Procedure Date  . Back surgery   . Neck surgery     c2  . Skin biopsy   . Hernia repair   . Prostatectomy      Scheduled Meds:  Continuous Infusions:   . 0.9 % NaCl with KCl 20 mEq / L     PRN Meds:.ALPRAZolam  Allergies as of 12/04/2012 - Review Complete 12/04/2012  Allergen Reaction Noted  . Aspirin  11/21/2011  . Codeine Other (See Comments) 11/21/2011  . Iohexol  08/15/2007  . Macrodantin Other (See Comments) 11/21/2011  . Morphine and related Itching 11/21/2011    History  reviewed. No pertinent family history.  History   Social History  . Marital Status: Married    Spouse Name: N/A    Number of Children: N/A  . Years of Education: N/A   Occupational History  . Not on file.   Social History Main Topics  . Smoking status: Former Smoker    Quit date: 11/20/1968  . Smokeless tobacco: Never Used  . Alcohol Use: No  . Drug Use: No  . Sexually Active: No   Other Topics Concern  . Not on file   Social History Narrative  . No narrative on file    Review of Systems: All negative except as stated above in HPI.  Physical Exam: Vital signs: Filed Vitals:   12/04/12 1602  BP: 147/75  Pulse: 93  Temp: 97.9  Resp: 18     General:   Thin, Alert,  No acute distress HEENT: anicteric Neck: supple, nontender Lungs:  Clear throughout to auscultation.   No wheezes, crackles, or rhonchi. No acute distress. Heart:  Regular rate and rhythm; no murmurs, clicks, rubs,  or gallops. Abdomen: Minimal LLQ tenderness with guarding, otherwise nontender, soft, nondistended, +BS  Rectal:  Deferred Ext: no edema  GI:  Lab Results:  Basename 12/04/12 1053  WBC 4.6  HGB 12.5*  HCT 36.0*  PLT 118*   BMET  Basename 12/04/12 1053  NA 137  K 3.8  CL 103  CO2 27  GLUCOSE  101*  BUN 18  CREATININE 0.97  CALCIUM 9.4   LFT  Basename 12/04/12 1053  PROT 6.9  ALBUMIN 4.0  AST 17  ALT 15  ALKPHOS 61  BILITOT 0.6  BILIDIR 0.1  IBILI 0.5   PT/INR No results found for this basename: LABPROT:2,INR:2 in the last 72 hours   Studies/Results: Ct Abdomen Pelvis Wo Contrast  12/04/2012  *RADIOLOGY REPORT*  Clinical Data: Rectal bleeding.  Non-Hodgkin's lymphoma.  CT ABDOMEN AND PELVIS WITHOUT CONTRAST  Technique:  Multidetector CT imaging of the abdomen and pelvis was performed following the standard protocol without intravenous contrast.  Comparison: CT scan dated 11/21/2011 and 07/05/2011  Findings: There is edema of the mucosa of the cecum and proximal  ascending colon.  There is also diffuse edema of the mucosa of the descending colon extending into the proximal sigmoid portion of the colon.  This is consistent with colitis.  In this patient in this distribution, ischemic colitis should be considered.  The transverse colon appears normal.  There are numerous diverticula throughout the colon particularly in the sigmoid region.  There are multiple benign-appearing renal and hepatic cysts, unchanged.  Spleen and pancreas and adrenal glands are normal. Dilatation of the abdominal aorta to a maximal diameter of 2.7 cm.  Again noted is asymmetric enlargement of the prostate gland with thickening of the bladder wall asymmetrically, unchanged.  Again noted are chronic changes in the pelvic bones and proximal femurs as well as in the T11 vertebral body.  These are stable.  IMPRESSION: Edema of the mucosa of the entire descending colon.  There is also edema in the cecum and proximal ascending colon.  Findings consistent with colitis.  Ischemic colitis should be considered particularly in a patient of this age.   Original Report Authenticated By: Francene Boyers, M.D.     Impression/Plan: 79yo with acute onset of rectal bleeding that is bright red and diarrhea and CT showing segmental colitis in proximal colon and also in a segment of the distal colon. Suspect he has ischemic colitis and not a bacterial or C diff colitis. Recommend IVFs, abx (Cipro/Flagyl IV), supportive care. Hold off on colonoscopy at this time but if bleeding persisting over next 1-2 days then may have to readdress this issue. In light of segmental colitis, I think his bleeding is colonic from this colitis and not from a diverticular source. Will hold off on bleeding scan and I do NOT think his bleeding is from an upper source. Dr. Ewing Schlein will see tomorrow.    LOS: 0 days   Mairlyn Tegtmeyer C.  12/04/2012, 4:14 PM

## 2012-12-04 NOTE — Progress Notes (Signed)
Patient's BP dropped to 52/31 earlier while sitting up and having a bowel movement. Patient stated that he felt "very tired" at the time and seemed to be weak but was alert and oriented. MD on call was notified. Once he got back into bed, BP was 116/56. Patient stated that he still felt more "tired" than usual. BP meds were held. Will continue to monitor. Leeroy Cha

## 2012-12-04 NOTE — ED Notes (Signed)
Reported POS Hemocult Stool to DR Freida Busman and RN Marchia Bond

## 2012-12-04 NOTE — Progress Notes (Signed)
Dr. Ewing Schlein will see patient in the morning.Dr. Thedore Mins  Made aware with orders.

## 2012-12-04 NOTE — ED Notes (Signed)
BSC placed at bedside.

## 2012-12-05 LAB — CBC
HCT: 28.1 % — ABNORMAL LOW (ref 39.0–52.0)
Hemoglobin: 9.6 g/dL — ABNORMAL LOW (ref 13.0–17.0)
MCH: 32.9 pg (ref 26.0–34.0)
MCV: 96.2 fL (ref 78.0–100.0)
RBC: 2.92 MIL/uL — ABNORMAL LOW (ref 4.22–5.81)
WBC: 3.8 10*3/uL — ABNORMAL LOW (ref 4.0–10.5)

## 2012-12-05 LAB — BASIC METABOLIC PANEL
CO2: 26 mEq/L (ref 19–32)
Chloride: 107 mEq/L (ref 96–112)
Glucose, Bld: 97 mg/dL (ref 70–99)
Potassium: 3.8 mEq/L (ref 3.5–5.1)
Sodium: 138 mEq/L (ref 135–145)

## 2012-12-05 LAB — HEMOGLOBIN AND HEMATOCRIT, BLOOD
HCT: 29.6 % — ABNORMAL LOW (ref 39.0–52.0)
HCT: 28.9 % — ABNORMAL LOW (ref 39.0–52.0)

## 2012-12-05 MED ORDER — POTASSIUM CHLORIDE IN NACL 20-0.9 MEQ/L-% IV SOLN
INTRAVENOUS | Status: AC
  Administered 2012-12-05: 11:00:00 via INTRAVENOUS
  Filled 2012-12-05 (×2): qty 1000

## 2012-12-05 NOTE — Progress Notes (Signed)
Utilization review completed.  

## 2012-12-05 NOTE — ED Provider Notes (Signed)
Medical screening examination/treatment/procedure(s) were conducted as a shared visit with non-physician practitioner(s) and myself.  I personally evaluated the patient during the encounter  Frank Kramer T Deroy Noah, MD 12/05/12 1117 

## 2012-12-05 NOTE — Progress Notes (Signed)
[facility]                                                                                Patient Demographics  Frank Kramer, is a 79 y.o. male  ZOX:[id number]  [id number]  DOB - 1922-09-06  Admit date - 12/04/2012  Admitting Physician Leroy Sea, MD  Outpatient Primary MD for the patient is Florentina Jenny, MD  LOS - 1   Chief Complaint  Patient presents with  . Rectal Bleeding        Assessment & Plan    1. Blood per rectum with likely lower GI bleed - CT suggestive of pancolitis, positive recent exposure to Cipro, he is positive for C. Difficile, H&H is currently stable when accounted for dilution due to IV fluids, will continue to monitor H&H type screen has been done, IV Cipro and by mouth Vanco for now, GI following the patient, will withhold IV Cipro which was ordered yesterday by GI. Chances of ischemic colitis are low at this time since we already have a positive C. difficile result. And used to be on clear liquid diet for now.   2. History of lymphoma which has been under remission, Paget's disease, MGUS - no acute issues patient will follow post discharge with Dr. Cyndie Chime.     3. History of GERD, dyslipidemia, hypertension - no acute issues continue PPI, and statin, Norvasc, Coreg along with enalapril.      Code Status: full  Family Communication: patient  Disposition Plan: home   Procedures CT Abd   Consults  [facility]   DVT Prophylaxis   SCDs   Lab Results  Component Value Date   PLT 113* 12/05/2012    Medications  Scheduled Meds:   . amLODipine  10 mg Oral QHS  . carvedilol  12.5 mg Oral BID WC  . cholecalciferol  1,000 Units Oral Daily  . enalapril  20 mg Oral BID  . ferrous sulfate  325 mg Oral Daily  . fluocinonide ointment  1 application Topical BID  . metronidazole  500 mg Intravenous Q8H  . multivitamin with minerals  1 tablet Oral Daily  . pantoprazole  40 mg Oral BID  . polycarbophil  625 mg Oral  Daily  . simvastatin  5 mg Oral q1800  . sodium chloride  3 mL Intravenous Q12H  . Tamsulosin HCl  0.4 mg Oral QPC supper  . vancomycin  125 mg Oral Q6H   Continuous Infusions:   . 0.9 % NaCl with KCl 20 mEq / L     PRN Meds:.albuterol, ALPRAZolam, butalbital-acetaminophen-caffeine, guaiFENesin-dextromethorphan, HYDROcodone-acetaminophen, ondansetron (ZOFRAN) IV, ondansetron  Antibiotics    Anti-infectives     Start     Dose/Rate Route Frequency Ordered Stop   12/04/12 2000   vancomycin (VANCOCIN) 50 mg/mL oral solution 125 mg        125 mg Oral 4 times per day 12/04/12 1912     12/04/12 1700   metroNIDAZOLE (FLAGYL) tablet 500 mg  Status:  Discontinued        500 mg Oral 3 times per day 12/04/12 1655 12/04/12 1700   12/04/12 1645   ciprofloxacin (CIPRO) IVPB 400 mg  Status:  Discontinued        400 mg 200 mL/hr over 60 Minutes Intravenous Every 12 hours 12/04/12 1637 12/04/12 1912   12/04/12 1645   metroNIDAZOLE (FLAGYL) IVPB 500 mg        500 mg 100 mL/hr over 60 Minutes Intravenous Every 8 hours 12/04/12 1637             Time Spent in minutes   35   SINGH,PRASHANT K M.D on 12/05/2012 at 9:06 AM  Between 7am to 7pm - Pager - [phone]  After 7pm go to [email or web address] - password [email or web address]  And look for the night coverage person covering for me after hours  [facility] Office  [phone]    Subjective:   [name] today has, No headache, No chest pain, No abdominal pain - No Nausea, No new weakness tingling or numbness, No Cough - SOB. Still having loose bowel movements mixed with blood.  Objective:   Filed Vitals:   12/04/12 2145 12/04/12 2151 12/05/12 0621 12/05/12 0844  BP: 52/31 116/56 115/60 141/83  Pulse: 56 79 93 84  Temp: 98.3 F (36.8 C)  98.5 F (36.9 C)   TempSrc:   Oral   Resp: 20  18   Height:      Weight:      SpO2:   98%     Wt Readings from Last 3 Encounters:  12/04/12 61.735 kg (136 lb 1.6 oz)  06/05/12 64.184 kg (141  lb 8 oz)  11/21/11 64.9 kg (143 lb 1.3 oz)     Intake/Output Summary (Last 24 hours) at 12/05/12 0906 Last data filed at 12/05/12 0700  Gross per 24 hour  Intake   1140 ml  Output      0 ml  Net   1140 ml    Exam Awake Alert, Oriented X 3, No new F.N deficits, Normal affect .AT,PERRAL Supple Neck,No JVD, No cervical lymphadenopathy appriciated.  Symmetrical Chest wall movement, Good air movement bilaterally, CTAB RRR,No Gallops,Rubs or new Murmurs, No Parasternal Heave +ve B.Sounds, Abd Soft, Non tender, No organomegaly appriciated, No rebound - guarding or rigidity. No Cyanosis, Clubbing or edema, No new Rash or bruise     Data Review   Micro Results Recent Results (from the past 240 hour(s))  CLOSTRIDIUM DIFFICILE BY PCR     Status: Abnormal   Collection Time   12/04/12  2:59 PM      Component Value Range Status Comment   C difficile by pcr POSITIVE (*) NEGATIVE Final     Radiology Reports Ct Abdomen Pelvis Wo Contrast  12/04/2012  *RADIOLOGY REPORT*  Clinical Data: Rectal bleeding.  Non-Hodgkin's lymphoma.  CT ABDOMEN AND PELVIS WITHOUT CONTRAST  Technique:  Multidetector CT imaging of the abdomen and pelvis was performed following the standard protocol without intravenous contrast.  Comparison: CT scan dated 11/21/2011 and 07/05/2011  Findings: There is edema of the mucosa of the cecum and proximal ascending colon.  There is also diffuse edema of the mucosa of the descending colon extending into the proximal sigmoid portion of the colon.  This is consistent with colitis.  In this patient in this distribution, ischemic colitis should be considered.  The transverse colon appears normal.  There are numerous diverticula throughout the colon particularly in the sigmoid region.  There are multiple benign-appearing renal and hepatic cysts, unchanged.  Spleen and pancreas and adrenal glands are normal. Dilatation of the abdominal aorta to a maximal diameter of 2.7 cm.  Again  noted is  asymmetric enlargement of the prostate gland with thickening of the bladder wall asymmetrically, unchanged.  Again noted are chronic changes in the pelvic bones and proximal femurs as well as in the T11 vertebral body.  These are stable.  IMPRESSION: Edema of the mucosa of the entire descending colon.  There is also edema in the cecum and proximal ascending colon.  Findings consistent with colitis.  Ischemic colitis should be considered particularly in a patient of this age.   Original Report Authenticated By: Francene Boyers, M.D.     CBC  Lab 12/05/12 0513 12/05/12 0025 12/04/12 1954 12/04/12 1655 12/04/12 1053  WBC 3.8* -- -- -- 4.6  HGB 9.6* 10.5* 10.9* 11.5* 12.5*  HCT 28.1* 29.6* 31.3* 33.4* 36.0*  PLT 113* -- -- -- 118*  MCV 96.2 -- -- -- 95.7  MCH 32.9 -- -- -- 33.2  MCHC 34.2 -- -- -- 34.7  RDW 14.0 -- -- -- 14.0  LYMPHSABS -- -- -- -- 1.1  MONOABS -- -- -- -- 0.4  EOSABS -- -- -- -- 0.1  BASOSABS -- -- -- -- 0.0  BANDABS -- -- -- -- --    Chemistries   Lab 12/05/12 0513 12/04/12 1053  NA 138 137  K 3.8 3.8  CL 107 103  CO2 26 27  GLUCOSE 97 101*  BUN 13 18  CREATININE 0.91 0.97  CALCIUM 8.3* 9.4  MG -- --  AST -- 17  ALT -- 15  ALKPHOS -- 61  BILITOT -- 0.6   ------------------------------------------------------------------------------------------------------------------ estimated creatinine clearance is 49 ml/min (by C-G formula based on Cr of 0.91). ------------------------------------------------------------------------------------------------------------------ No results found for this basename: HGBA1C:2 in the last 72 hours ------------------------------------------------------------------------------------------------------------------ No results found for this basename: CHOL:2,HDL:2,LDLCALC:2,TRIG:2,CHOLHDL:2,LDLDIRECT:2 in the last 72  hours ------------------------------------------------------------------------------------------------------------------ No results found for this basename: TSH,T4TOTAL,FREET3,T3FREE,THYROIDAB in the last 72 hours ------------------------------------------------------------------------------------------------------------------ No results found for this basename: VITAMINB12:2,FOLATE:2,FERRITIN:2,TIBC:2,IRON:2,RETICCTPCT:2 in the last 72 hours  Coagulation profile  Lab 12/04/12 1724  INR 1.11  PROTIME --    No results found for this basename: DDIMER:2 in the last 72 hours  Cardiac Enzymes No results found for this basename: CK:3,CKMB:3,TROPONINI:3,MYOGLOBIN:3 in the last 168 hours ------------------------------------------------------------------------------------------------------------------ No components found with this basename: POCBNP:3

## 2012-12-05 NOTE — Progress Notes (Signed)
Frank Kramer 11:05 AM  Subjective: Patient is stable without any new complaints and no abdominal pain but still with some diarrhea low volume and minimal blood  Objective: Vital signs currently stable afebrile no acute distress abdomen is soft nontender hemoglobin slight drop C. difficile positive  Assessment: Probable pseudomembranous colitis  Plan: Agree with current management and continue clear liquids for one more day and if seemingly better without any further significant bleeding might advance diet tomorrow and call me sooner when necessary  [facility]

## 2012-12-06 LAB — HEMOGLOBIN AND HEMATOCRIT, BLOOD
HCT: 27.1 % — ABNORMAL LOW (ref 39.0–52.0)
Hemoglobin: 9.4 g/dL — ABNORMAL LOW (ref 13.0–17.0)

## 2012-12-06 MED ORDER — SODIUM CHLORIDE 0.9 % IV SOLN: INTRAVENOUS | Status: DC

## 2012-12-06 MED ORDER — POTASSIUM CHLORIDE IN NACL 20-0.9 MEQ/L-% IV SOLN
INTRAVENOUS | Status: DC
  Administered 2012-12-06: 10:00:00 via INTRAVENOUS
  Filled 2012-12-06 (×2): qty 1000

## 2012-12-06 NOTE — Progress Notes (Signed)
[facility]                                                                                Patient Demographics  Frank Kramer, is a 79 y.o. male  ION:[id number]  [id number]:[id number]  DOB - 1922-05-17  Admit date - 12/04/2012  Admitting Physician Leroy Sea, MD  Outpatient Primary MD for the patient is Florentina Jenny, MD  LOS - 2   Chief Complaint  Patient presents with  . Rectal Bleeding        Assessment & Plan    1. Blood per rectum with likely lower GI bleed - CT suggestive of pancolitis, positive recent exposure to Cipro, he is positive for C. Difficile, H&H is currently stable when accounted for dilution due to IV fluids, will continue to monitor H&H type screen has been done, IV Cipro and by mouth Vanco for now, GI following the patient. Continue  on clear liquid diet for now.   2. History of lymphoma which has been under remission, Paget's disease, MGUS - no acute issues patient will follow post discharge with Dr. Cyndie Chime.     3. History of GERD, dyslipidemia, hypertension - no acute issues continue PPI, and statin, Norvasc, Coreg along with enalapril.      Code Status: full  Family Communication: patient  Disposition Plan: home   Procedures CT Abd   Consults  [facility]   DVT Prophylaxis   SCDs   Lab Results  Component Value Date   PLT 113* 12/05/2012    Medications  Scheduled Meds:    . amLODipine  10 mg Oral QHS  . carvedilol  12.5 mg Oral BID WC  . cholecalciferol  1,000 Units Oral Daily  . enalapril  20 mg Oral BID  . ferrous sulfate  325 mg Oral Daily  . fluocinonide ointment  1 application Topical BID  . metronidazole  500 mg Intravenous Q8H  . multivitamin with minerals  1 tablet Oral Daily  . pantoprazole  40 mg Oral BID  . polycarbophil  625 mg Oral Daily  . simvastatin  5 mg Oral q1800  . sodium chloride  3 mL Intravenous Q12H  . Tamsulosin HCl  0.4 mg Oral QPC supper  . vancomycin  125 mg Oral Q6H    Continuous Infusions:    . 0.9 % NaCl with KCl 20 mEq / L 50 mL/hr at 12/06/12 1025   PRN Meds:.albuterol, ALPRAZolam, butalbital-acetaminophen-caffeine, guaiFENesin-dextromethorphan, HYDROcodone-acetaminophen, ondansetron (ZOFRAN) IV, ondansetron  Antibiotics    Anti-infectives     Start     Dose/Rate Route Frequency Ordered Stop   12/04/12 2000   vancomycin (VANCOCIN) 50 mg/mL oral solution 125 mg        125 mg Oral 4 times per day 12/04/12 1912     12/04/12 1700   metroNIDAZOLE (FLAGYL) tablet 500 mg  Status:  Discontinued        500 mg Oral 3 times per day 12/04/12 1655 12/04/12 1700   12/04/12 1645   ciprofloxacin (CIPRO) IVPB 400 mg  Status:  Discontinued        400 mg 200 mL/hr over 60 Minutes Intravenous Every 12 hours 12/04/12 1637 12/04/12  1912   12/04/12 1645   metroNIDAZOLE (FLAGYL) IVPB 500 mg        500 mg 100 mL/hr over 60 Minutes Intravenous Every 8 hours 12/04/12 1637             Time Spent in minutes   35   Patriciaann Rabanal K M.D on 12/06/2012 at 10:26 AM  Between 7am to 7pm - Pager - [phone]  After 7pm go to [email or web address] - password [email or web address]  And look for the night coverage person covering for me after hours  [facility] Office  [phone]    Subjective:   Frank Kramer today has, No headache, No chest pain, No abdominal pain - No Nausea, No new weakness tingling or numbness, No Cough - SOB. Still having loose bowel movements mixed with blood.  Objective:   Filed Vitals:   12/05/12 1300 12/05/12 2052 12/06/12 0431 12/06/12 0436  BP: 131/62 129/61  125/56  Pulse: 76 84  87  Temp: 97.8 F (36.6 C) 97.6 F (36.4 C)  97.1 F (36.2 C)  TempSrc: Oral Oral  Oral  Resp: 18 18  17   Height:      Weight:   60.147 kg (132 lb 9.6 oz)   SpO2: 100% 96%  97%    Wt Readings from Last 3 Encounters:  12/06/12 60.147 kg (132 lb 9.6 oz)  06/05/12 64.184 kg (141 lb 8 oz)  11/21/11 64.9 kg (143 lb 1.3 oz)     Intake/Output Summary  (Last 24 hours) at 12/06/12 1026 Last data filed at 12/06/12 1018  Gross per 24 hour  Intake   3658 ml  Output   1177 ml  Net   2481 ml    Exam Awake Alert, Oriented X 3, No new F.N deficits, Normal affect [place].AT,PERRAL Supple Neck,No JVD, No cervical lymphadenopathy appriciated.  Symmetrical Chest wall movement, Good air movement bilaterally, CTAB RRR,No Gallops,Rubs or new Murmurs, No Parasternal Heave +ve B.Sounds, Abd Soft, Non tender, No organomegaly appriciated, No rebound - guarding or rigidity. No Cyanosis, Clubbing or edema, No new Rash or bruise     Data Review   Micro Results Recent Results (from the past 240 hour(s))  CLOSTRIDIUM DIFFICILE BY PCR     Status: Abnormal   Collection Time   12/04/12  2:59 PM      Component Value Range Status Comment   C difficile by pcr POSITIVE (*) NEGATIVE Final     Radiology Reports Ct Abdomen Pelvis Wo Contrast  12/04/2012  *RADIOLOGY REPORT*  Clinical Data: Rectal bleeding.  Non-Hodgkin's lymphoma.  CT ABDOMEN AND PELVIS WITHOUT CONTRAST  Technique:  Multidetector CT imaging of the abdomen and pelvis was performed following the standard protocol without intravenous contrast.  Comparison: CT scan dated 11/21/2011 and 07/05/2011  Findings: There is edema of the mucosa of the cecum and proximal ascending colon.  There is also diffuse edema of the mucosa of the descending colon extending into the proximal sigmoid portion of the colon.  This is consistent with colitis.  In this patient in this distribution, ischemic colitis should be considered.  The transverse colon appears normal.  There are numerous diverticula throughout the colon particularly in the sigmoid region.  There are multiple benign-appearing renal and hepatic cysts, unchanged.  Spleen and pancreas and adrenal glands are normal. Dilatation of the abdominal aorta to a maximal diameter of 2.7 cm.  Again noted is asymmetric enlargement of the prostate gland with thickening of the  bladder wall asymmetrically, unchanged.  Again  noted are chronic changes in the pelvic bones and proximal femurs as well as in the T11 vertebral body.  These are stable.  IMPRESSION: Edema of the mucosa of the entire descending colon.  There is also edema in the cecum and proximal ascending colon.  Findings consistent with colitis.  Ischemic colitis should be considered particularly in a patient of this age.   Original Report Authenticated By: Francene Boyers, M.D.     CBC  Lab 12/06/12 0830 12/05/12 1800 12/05/12 0513 12/05/12 0025 12/04/12 1954 12/04/12 1053  WBC -- -- 3.8* -- -- 4.6  HGB 9.4* 10.2* 9.6* 10.5* 10.9* --  HCT 27.1* 28.9* 28.1* 29.6* 31.3* --  PLT -- -- 113* -- -- 118*  MCV -- -- 96.2 -- -- 95.7  MCH -- -- 32.9 -- -- 33.2  MCHC -- -- 34.2 -- -- 34.7  RDW -- -- 14.0 -- -- 14.0  LYMPHSABS -- -- -- -- -- 1.1  MONOABS -- -- -- -- -- 0.4  EOSABS -- -- -- -- -- 0.1  BASOSABS -- -- -- -- -- 0.0  BANDABS -- -- -- -- -- --    Chemistries   Lab 12/05/12 0513 12/04/12 1053  NA 138 137  K 3.8 3.8  CL 107 103  CO2 26 27  GLUCOSE 97 101*  BUN 13 18  CREATININE 0.91 0.97  CALCIUM 8.3* 9.4  MG -- --  AST -- 17  ALT -- 15  ALKPHOS -- 61  BILITOT -- 0.6   ------------------------------------------------------------------------------------------------------------------ estimated creatinine clearance is 47.7 ml/min (by C-G formula based on Cr of 0.91). ------------------------------------------------------------------------------------------------------------------ No results found for this basename: HGBA1C:2 in the last 72 hours ------------------------------------------------------------------------------------------------------------------ No results found for this basename: CHOL:2,HDL:2,LDLCALC:2,TRIG:2,CHOLHDL:2,LDLDIRECT:2 in the last 72 hours ------------------------------------------------------------------------------------------------------------------ No results  found for this basename: TSH,T4TOTAL,FREET3,T3FREE,THYROIDAB in the last 72 hours ------------------------------------------------------------------------------------------------------------------ No results found for this basename: VITAMINB12:2,FOLATE:2,FERRITIN:2,TIBC:2,IRON:2,RETICCTPCT:2 in the last 72 hours  Coagulation profile  Lab 12/04/12 1724  INR 1.11  PROTIME --    No results found for this basename: DDIMER:2 in the last 72 hours  Cardiac Enzymes No results found for this basename: CK:3,CKMB:3,TROPONINI:3,MYOGLOBIN:3 in the last 168 hours ------------------------------------------------------------------------------------------------------------------ No components found with this basename: POCBNP:3

## 2012-12-06 NOTE — Progress Notes (Signed)
12-06-12  NSG:  Pt had 4 stools in past 10 hours.   At 2000 had 250 cc's of reddish liquid c some clots noted, at 2100 he had the same, at 2300 it was 120 cc's reddish c some clots noted.  The at 0500 he had 100 cc's of brownish/red liquid stool.  VS, and telemetry have remained stable.  Abd remains soft, non distended, tender in lower quads.  He continues to have mid to right sided back pain which he says is chronic and unchanged from normal ranging from a 3 to a 5 on the pain scale and is relieved with 1 vicodin.  His anxiety level is VERY high and is reduced with xanax x 2 this shift.

## 2012-12-06 NOTE — Progress Notes (Signed)
No morning labs

## 2012-12-06 NOTE — Progress Notes (Signed)
Frank Kramer 11:44 AM  Subjective: Patient is frustrated that he is still having some bleeding and diarrhea but still no pain and no new complaints and we rediscussed the diagnosis and had a long talk with the hospital team as well  Objective: Vital signs stable afebrile no acute distress abdomen is soft nontender hemoglobin fairly stable  Assessment: Probable pseudomembranous colitis with some bleeding  Plan: Will hold his iron to decrease the chance that the color is being caused by that and watch one more day on clear liquid and if diarrhea or bleeding is continuing will ask my partners to consider flexible sigmoidoscopy or colonoscopy next week otherwise if better tomorrow with less bleeding and diarrhea can probably advance diet and consider increasing vancomycin to 250 mg 4 times a day Arin Peral E

## 2012-12-07 ENCOUNTER — Other Ambulatory Visit: Payer: Medicare Other | Admitting: Lab

## 2012-12-07 LAB — CBC WITH DIFFERENTIAL/PLATELET
Basophils Relative: 0 % (ref 0–1)
HCT: 24.8 % — ABNORMAL LOW (ref 39.0–52.0)
Hemoglobin: 8.9 g/dL — ABNORMAL LOW (ref 13.0–17.0)
Lymphocytes Relative: 26 % (ref 12–46)
Monocytes Absolute: 0.3 10*3/uL (ref 0.1–1.0)
Monocytes Relative: 10 % (ref 3–12)
Neutro Abs: 1.9 10*3/uL (ref 1.7–7.7)
Neutrophils Relative %: 59 % (ref 43–77)
RBC: 2.59 MIL/uL — ABNORMAL LOW (ref 4.22–5.81)
WBC: 3.3 10*3/uL — ABNORMAL LOW (ref 4.0–10.5)

## 2012-12-07 LAB — BASIC METABOLIC PANEL
BUN: 6 mg/dL (ref 6–23)
Chloride: 107 mEq/L (ref 96–112)
GFR calc Af Amer: 84 mL/min — ABNORMAL LOW (ref >90)
GFR calc non Af Amer: 72 mL/min — ABNORMAL LOW (ref >90)
Potassium: 3.4 mEq/L — ABNORMAL LOW (ref 3.5–5.1)
Sodium: 138 mEq/L (ref 135–145)

## 2012-12-07 MED ORDER — SACCHAROMYCES BOULARDII 250 MG PO CAPS
250.0000 mg | ORAL_CAPSULE | Freq: Two times a day (BID) | ORAL | Status: DC
  Administered 2012-12-07 – 2012-12-08 (×3): 250 mg via ORAL
  Filled 2012-12-07 (×4): qty 1

## 2012-12-07 MED ORDER — POTASSIUM CHLORIDE CRYS ER 20 MEQ PO TBCR
40.0000 meq | EXTENDED_RELEASE_TABLET | Freq: Once | ORAL | Status: AC
  Administered 2012-12-07: 40 meq via ORAL
  Filled 2012-12-07: qty 2

## 2012-12-07 NOTE — Progress Notes (Signed)
Subjective: One diarrheal stool last night; diarrheal frequency is decreasing. No further reported blood in stool. No abdominal pain.  Tolerating current diet.  Objective: Vital signs in last 24 hours: Temp:  [97.2 F (36.2 C)-98.1 F (36.7 C)] 97.7 F (36.5 C) (02/03 0538) Pulse Rate:  [79-85] 85  (02/03 0538) Resp:  [18] 18  (02/03 0538) BP: (117-142)/(55-64) 142/55 mmHg (02/03 0538) SpO2:  [97 %-98 %] 98 % (02/03 0538) Weight:  [60.6 kg (133 lb 9.6 oz)] 60.6 kg (133 lb 9.6 oz) (02/03 0500) Weight change: 0.453 kg (1 lb) Last BM Date: 12/06/12  PE: GEN:  Elderly, somewhat cachectic-appearing but is in NAD ABD:  Soft  Lab Results: CBC    Component Value Date/Time   WBC 3.3* 12/07/2012 0455   WBC 3.6* 05/29/2012 0943   RBC 2.59* 12/07/2012 0455   RBC 3.94* 05/29/2012 0943   HGB 8.9* 12/07/2012 0455   HGB 13.0 05/29/2012 0943   HCT 24.8* 12/07/2012 0455   HCT 37.0* 05/29/2012 0943   PLT 91* 12/07/2012 0455   PLT 113* 05/29/2012 0943   MCV 95.8 12/07/2012 0455   MCV 94.0 05/29/2012 0943   MCH 34.4* 12/07/2012 0455   MCH 33.0 05/29/2012 0943   MCHC 35.9 12/07/2012 0455   MCHC 35.1 05/29/2012 0943   RDW 14.2 12/07/2012 0455   RDW 16.1* 05/29/2012 0943   LYMPHSABS 0.9 12/07/2012 0455   LYMPHSABS 0.7* 05/29/2012 0943   MONOABS 0.3 12/07/2012 0455   MONOABS 0.5 05/29/2012 0943   EOSABS 0.2 12/07/2012 0455   EOSABS 0.0 05/29/2012 0943   BASOSABS 0.0 12/07/2012 0455   BASOSABS 0.0 05/29/2012 0943   Assessment:  1.  C. Diff colitis with diarrhea, improving. 2.  Hematochezia with anemia, improving.  Colonoscopy done in 2008 showed sigmoid colon diverticula.  Plan:  1.  Continue antibiotics for C. Diff colitis, anticipated 14-day total course of treatment. 2.  Advance diet to full liquids today, and continue to advance slowly as tolerated. 3.  Will sign-off; please call with questions.  Thank you for the consult.  Patient can follow-up with [facility] on as-needed basis (has seen Dr. Evette Cristal as  outpatient).   Freddy Jaksch 12/07/2012, 9:01 AM

## 2012-12-07 NOTE — Progress Notes (Signed)
[facility]                                                                                Patient Demographics  Frank Kramer, is a 79 y.o. male  GNF:[id number]  QIO:[id number]  DOB - [birth year]  Admit date - 12/04/2012  Admitting Physician Leroy Sea, MD  Outpatient Primary MD for the patient is Florentina Jenny, MD  LOS - 3   Chief Complaint  Patient presents with  . Rectal Bleeding        Assessment & Plan    1. Blood per rectum with likely lower GI bleed - CT suggestive of pancolitis, positive recent exposure to Cipro, he is positive for C. Difficile, H&H is currently stable when accounted for dilution due to IV fluids, will continue to monitor H&H type screen has been done, IV Flagyl and by mouth Vanco for now, GI following the patient. Diet to full liquid , if stable DC in am.   2. History of lymphoma which has been under remission, Paget's disease, MGUS - no acute issues patient will follow post discharge with Dr. Cyndie Chime.     3. History of GERD, dyslipidemia, hypertension - no acute issues continue PPI, and statin, Norvasc, Coreg along with enalapril.    4. Low potassium replaced will be rechecked.    Code Status: full  Family Communication: patient  Disposition Plan: home   Procedures CT Abd   Consults  [facility]   DVT Prophylaxis   SCDs   Lab Results  Component Value Date   PLT 91* 12/07/2012    Medications  Scheduled Meds:    . amLODipine  10 mg Oral QHS  . carvedilol  12.5 mg Oral BID WC  . cholecalciferol  1,000 Units Oral Daily  . enalapril  20 mg Oral BID  . fluocinonide ointment  1 application Topical BID  . metronidazole  500 mg Intravenous Q8H  . multivitamin with minerals  1 tablet Oral Daily  . pantoprazole  40 mg Oral BID  . potassium chloride  40 mEq Oral Once  . saccharomyces boulardii  250 mg Oral BID  . simvastatin  5 mg Oral q1800  . sodium chloride  3 mL Intravenous Q12H  . Tamsulosin HCl  0.4  mg Oral QPC supper  . vancomycin  125 mg Oral Q6H   Continuous Infusions:   PRN Meds:.albuterol, ALPRAZolam, butalbital-acetaminophen-caffeine, guaiFENesin-dextromethorphan, HYDROcodone-acetaminophen, ondansetron (ZOFRAN) IV, ondansetron  Antibiotics    Anti-infectives     Start     Dose/Rate Route Frequency Ordered Stop   12/04/12 2000   vancomycin (VANCOCIN) 50 mg/mL oral solution 125 mg        125 mg Oral 4 times per day 12/04/12 1912     12/04/12 1700   metroNIDAZOLE (FLAGYL) tablet 500 mg  Status:  Discontinued        500 mg Oral 3 times per day 12/04/12 1655 12/04/12 1700   12/04/12 1645   ciprofloxacin (CIPRO) IVPB 400 mg  Status:  Discontinued        400 mg 200 mL/hr over 60 Minutes Intravenous Every 12 hours 12/04/12 1637 12/04/12 1912   12/04/12 1645  metroNIDAZOLE (FLAGYL) IVPB 500 mg        500 mg 100 mL/hr over 60 Minutes Intravenous Every 8 hours 12/04/12 1637             Time Spent in minutes   35   Nashla Althoff K M.D on 12/07/2012 at 10:23 AM  Between 7am to 7pm - Pager - [phone]  After 7pm go to [email or web address] - password [email or web address]  And look for the night coverage person covering for me after hours  [facility] Office  [phone]    Subjective:   Frank Kramer today has, No headache, No chest pain, No abdominal pain - No Nausea, No new weakness tingling or numbness, No Cough - SOB. Still having loose bowel movements mixed with blood but have gone down considerably.  Objective:   Filed Vitals:   12/06/12 1251 12/06/12 2116 12/07/12 0500 12/07/12 0538  BP: 128/64 117/60  142/55  Pulse: 82 79  85  Temp: 97.2 F (36.2 C) 98.1 F (36.7 C)  97.7 F (36.5 C)  TempSrc: Oral Oral  Oral  Resp: 18 18  18   Height:      Weight:   60.6 kg (133 lb 9.6 oz)   SpO2: 97% 97%  98%    Wt Readings from Last 3 Encounters:  12/07/12 60.6 kg (133 lb 9.6 oz)  06/05/12 64.184 kg (141 lb 8 oz)  11/21/11 64.9 kg (143 lb 1.3 oz)     Intake/Output  Summary (Last 24 hours) at 12/07/12 1023 Last data filed at 12/07/12 0900  Gross per 24 hour  Intake 2789.17 ml  Output   2529 ml  Net 260.17 ml    Exam Awake Alert, Oriented X 3, No new F.N deficits, Normal affect [place].AT,PERRAL Supple Neck,No JVD, No cervical lymphadenopathy appriciated.  Symmetrical Chest wall movement, Good air movement bilaterally, CTAB RRR,No Gallops,Rubs or new Murmurs, No Parasternal Heave +ve B.Sounds, Abd Soft, Non tender, No organomegaly appriciated, No rebound - guarding or rigidity. No Cyanosis, Clubbing or edema, No new Rash or bruise     Data Review   Micro Results Recent Results (from the past 240 hour(s))  CLOSTRIDIUM DIFFICILE BY PCR     Status: Abnormal   Collection Time   12/04/12  2:59 PM      Component Value Range Status Comment   C difficile by pcr POSITIVE (*) NEGATIVE Final     Radiology Reports Ct Abdomen Pelvis Wo Contrast  12/04/2012  *RADIOLOGY REPORT*  Clinical Data: Rectal bleeding.  Non-Hodgkin's lymphoma.  CT ABDOMEN AND PELVIS WITHOUT CONTRAST  Technique:  Multidetector CT imaging of the abdomen and pelvis was performed following the standard protocol without intravenous contrast.  Comparison: CT scan dated 11/21/2011 and 07/05/2011  Findings: There is edema of the mucosa of the cecum and proximal ascending colon.  There is also diffuse edema of the mucosa of the descending colon extending into the proximal sigmoid portion of the colon.  This is consistent with colitis.  In this patient in this distribution, ischemic colitis should be considered.  The transverse colon appears normal.  There are numerous diverticula throughout the colon particularly in the sigmoid region.  There are multiple benign-appearing renal and hepatic cysts, unchanged.  Spleen and pancreas and adrenal glands are normal. Dilatation of the abdominal aorta to a maximal diameter of 2.7 cm.  Again noted is asymmetric enlargement of the prostate gland with thickening of  the bladder wall asymmetrically, unchanged.  Again noted are chronic changes in the  pelvic bones and proximal femurs as well as in the T11 vertebral body.  These are stable.  IMPRESSION: Edema of the mucosa of the entire descending colon.  There is also edema in the cecum and proximal ascending colon.  Findings consistent with colitis.  Ischemic colitis should be considered particularly in a patient of this age.   Original Report Authenticated By: Francene Boyers, M.D.     CBC  Lab 12/07/12 0455 12/06/12 0830 12/05/12 1800 12/05/12 0513 12/05/12 0025 12/04/12 1053  WBC 3.3* -- -- 3.8* -- 4.6  HGB 8.9* 9.4* 10.2* 9.6* 10.5* --  HCT 24.8* 27.1* 28.9* 28.1* 29.6* --  PLT 91* -- -- 113* -- 118*  MCV 95.8 -- -- 96.2 -- 95.7  MCH 34.4* -- -- 32.9 -- 33.2  MCHC 35.9 -- -- 34.2 -- 34.7  RDW 14.2 -- -- 14.0 -- 14.0  LYMPHSABS 0.9 -- -- -- -- 1.1  MONOABS 0.3 -- -- -- -- 0.4  EOSABS 0.2 -- -- -- -- 0.1  BASOSABS 0.0 -- -- -- -- 0.0  BANDABS -- -- -- -- -- --    Chemistries   Lab 12/07/12 0455 12/05/12 0513 12/04/12 1053  NA 138 138 137  K 3.4* 3.8 3.8  CL 107 107 103  CO2 24 26 27   GLUCOSE 106* 97 101*  BUN 6 13 18   CREATININE 0.94 0.91 0.97  CALCIUM 8.2* 8.3* 9.4  MG -- -- --  AST -- -- 17  ALT -- -- 15  ALKPHOS -- -- 61  BILITOT -- -- 0.6   ------------------------------------------------------------------------------------------------------------------ estimated creatinine clearance is 46.6 ml/min (by C-G formula based on Cr of 0.94). ------------------------------------------------------------------------------------------------------------------ No results found for this basename: HGBA1C:2 in the last 72 hours ------------------------------------------------------------------------------------------------------------------ No results found for this basename: CHOL:2,HDL:2,LDLCALC:2,TRIG:2,CHOLHDL:2,LDLDIRECT:2 in the last 72  hours ------------------------------------------------------------------------------------------------------------------ No results found for this basename: TSH,T4TOTAL,FREET3,T3FREE,THYROIDAB in the last 72 hours ------------------------------------------------------------------------------------------------------------------ No results found for this basename: VITAMINB12:2,FOLATE:2,FERRITIN:2,TIBC:2,IRON:2,RETICCTPCT:2 in the last 72 hours  Coagulation profile  Lab 12/04/12 1724  INR 1.11  PROTIME --    No results found for this basename: DDIMER:2 in the last 72 hours  Cardiac Enzymes No results found for this basename: CK:3,CKMB:3,TROPONINI:3,MYOGLOBIN:3 in the last 168 hours ------------------------------------------------------------------------------------------------------------------ No components found with this basename: POCBNP:3

## 2012-12-07 NOTE — Progress Notes (Addendum)
12-07-12 0500  NSG:  Pt has only one medium liquid blackish stool this shift. Noted to have some PVC's during this shift.  Pt states he can "feel the flutter"  NAD.  Will continue to monitor

## 2012-12-08 LAB — HEMOGLOBIN AND HEMATOCRIT, BLOOD: HCT: 25.5 % — ABNORMAL LOW (ref 39.0–52.0)

## 2012-12-08 LAB — POTASSIUM: Potassium: 3.6 mEq/L (ref 3.5–5.1)

## 2012-12-08 MED ORDER — METRONIDAZOLE 500 MG PO TABS: 500.0000 mg | ORAL_TABLET | Freq: Three times a day (TID) | ORAL | Status: DC

## 2012-12-08 MED ORDER — VANCOMYCIN 50 MG/ML ORAL SOLUTION: 125.0000 mg | Freq: Four times a day (QID) | ORAL | Status: DC

## 2012-12-08 NOTE — Progress Notes (Signed)
Patient discharge to home, son at bedside. D/C instructions done and given to the patient. PIV removed no s/s/ of infiltration or swelling noted upon discharged. No complaints of any pain or discomfort upon discharge.

## 2012-12-08 NOTE — Discharge Summary (Signed)
[facility]                                                                                   Frank Kramer, is a 79 y.o. male  DOB [birth year]  MRN [id number].  Admission date:  12/04/2012  Discharge Date:  12/08/2012  Primary MD  Florentina Jenny, MD  Admitting Physician  Leroy Sea, MD  Admission Diagnosis  Anxiety disorder [300.00] Arthritis [716.90] Colitis [558.9] Paget's bone disease [731.0] Anemia [285.9] LGI bleed [578.9] MGUS (monoclonal gammopathy of unknown significance) [273.1] RECTAL BLEEDING  Discharge Diagnosis     Principal Problem:  *LGI bleed Active Problems:  Rectal bleeding  HTN (hypertension)  GERD (gastroesophageal reflux disease)  Anemia  MGUS (monoclonal gammopathy of unknown significance)  Paget's bone disease  Colitis     Past Medical History  Diagnosis Date  . Arthritis   . Hypertension   . High cholesterol   . GERD (gastroesophageal reflux disease)   . Panic     panic disorder  . MGUS (monoclonal gammopathy of unknown significance) 06/07/2012  . Rhinitis 06/07/2012  . Paget's bone disease   . Skin cancer   . Non Hodgkin's lymphoma dx'd 1997    Past Surgical History  Procedure Date  . Back surgery   . Neck surgery     c2  . Skin biopsy   . Hernia repair   . Prostatectomy      Recommendations for primary care physician for things to follow:   Follow CBC   Discharge Diagnoses:   Principal Problem:  *LGI bleed Active Problems:  Rectal bleeding  HTN (hypertension)  GERD (gastroesophageal reflux disease)  Anemia  MGUS (monoclonal gammopathy of unknown significance)  Paget's bone disease  Colitis    Discharge Condition: Stable   Diet recommendation: See Discharge Instructions below   Consults GI   History of present illness and  Hospital Course:  See H&P, Labs, Consult and Test reports for all details in brief, patient was admitted for severe C. difficile colitis induced diarrhea which was  bloody, CT showed pancolitis, patient had positive exposure to Cipro recently, was treated with oral vancomycin and IV Flagyl with good results, bloody diarrhea is much reduced, he is having 1-2 BMs a day, H&H is essentially remained stable when accounted for dilution with IV fluids. Was seen by GI physician Dr. Dulce Sellar, tolerating full diet will be discharged, will request primary care physician to monitor CBC closely. We'll follow with GI as outpatient also.   For his history of lymphoma which is under depression, Paget's disease and MGUS. He will follow with Dr. Kennedy Bucker for tumor post discharge as before no acute issues.   History of GERD, dyslipidemia, hypertension home medications which include statin , Norvasc , enalapril and Coreg to be continued I'm holding PPI till C. difficile completely resolves.      Today   Subjective:   Frank Kramer today has no headache,no chest abdominal pain,no new weakness tingling or numbness, feels much better wants to go home today.    Objective:   Blood pressure 126/66, pulse 97, temperature 97.7 F (36.5 C), temperature source Oral, resp. rate 16, height  5\' 7"  (1.702 m), weight 59.784 kg (131 lb 12.8 oz), SpO2 100.00%.   Intake/Output Summary (Last 24 hours) at 12/08/12 0856 Last data filed at 12/08/12 0745  Gross per 24 hour  Intake   1480 ml  Output   1800 ml  Net   -320 ml    Exam Awake Alert, Oriented *3, No new F.N deficits, Normal affect .AT,PERRAL Supple Neck,No JVD, No cervical lymphadenopathy appriciated.  Symmetrical Chest wall movement, Good air movement bilaterally, CTAB RRR,No Gallops,Rubs or new Murmurs, No Parasternal Heave +ve B.Sounds, Abd Soft, Non tender, No organomegaly appriciated, No rebound -guarding or rigidity. No Cyanosis, Clubbing or edema, No new Rash or bruise  Data Review   Major procedures and Radiology Reports - PLEASE review detailed and final reports for all details in brief -       Ct Abdomen Pelvis  Wo Contrast  12/04/2012  *RADIOLOGY REPORT*  Clinical Data: Rectal bleeding.  Non-Hodgkin's lymphoma.  CT ABDOMEN AND PELVIS WITHOUT CONTRAST  Technique:  Multidetector CT imaging of the abdomen and pelvis was performed following the standard protocol without intravenous contrast.  Comparison: CT scan dated 11/21/2011 and 07/05/2011  Findings: There is edema of the mucosa of the cecum and proximal ascending colon.  There is also diffuse edema of the mucosa of the descending colon extending into the proximal sigmoid portion of the colon.  This is consistent with colitis.  In this patient in this distribution, ischemic colitis should be considered.  The transverse colon appears normal.  There are numerous diverticula throughout the colon particularly in the sigmoid region.  There are multiple benign-appearing renal and hepatic cysts, unchanged.  Spleen and pancreas and adrenal glands are normal. Dilatation of the abdominal aorta to a maximal diameter of 2.7 cm.  Again noted is asymmetric enlargement of the prostate gland with thickening of the bladder wall asymmetrically, unchanged.  Again noted are chronic changes in the pelvic bones and proximal femurs as well as in the T11 vertebral body.  These are stable.  IMPRESSION: Edema of the mucosa of the entire descending colon.  There is also edema in the cecum and proximal ascending colon.  Findings consistent with colitis.  Ischemic colitis should be considered particularly in a patient of this age.   Original Report Authenticated By: Francene Boyers, M.D.     Micro Results      Recent Results (from the past 240 hour(s))  CLOSTRIDIUM DIFFICILE BY PCR     Status: Abnormal   Collection Time   12/04/12  2:59 PM      Component Value Range Status Comment   C difficile by pcr POSITIVE (*) NEGATIVE Final      CBC w Diff: Lab Results  Component Value Date   WBC 3.3* 12/07/2012   WBC 3.6* 05/29/2012   HGB 9.1* 12/08/2012   HGB 13.0 05/29/2012   HCT 25.5* 12/08/2012    HCT 37.0* 05/29/2012   PLT 91* 12/07/2012   PLT 113* 05/29/2012   LYMPHOPCT 26 12/07/2012   LYMPHOPCT 20.2 05/29/2012   MONOPCT 10 12/07/2012   MONOPCT 13.8 05/29/2012   EOSPCT 5 12/07/2012   EOSPCT 1.0 05/29/2012   BASOPCT 0 12/07/2012   BASOPCT 0.4 05/29/2012    CMP: Lab Results  Component Value Date   NA 138 12/07/2012   K 3.6 12/08/2012   CL 107 12/07/2012   CO2 24 12/07/2012   BUN 6 12/07/2012   CREATININE 0.94 12/07/2012   PROT 6.9 12/04/2012   ALBUMIN 4.0  12/04/2012   BILITOT 0.6 12/04/2012   ALKPHOS 61 12/04/2012   AST 17 12/04/2012   ALT 15 12/04/2012  .   Discharge Instructions     Follow with Primary MD Florentina Jenny, MD in 2 days   Get CBC, CMP, checked 2 days by Primary MD and again as instructed by your Primary MD.    Get Medicines reviewed and adjusted.  Please request your Prim.MD to go over all Hospital Tests and Procedure/Radiological results at the follow up, please get all Hospital records sent to your Prim MD by signing hospital release before you go home.  Activity: As tolerated with Full fall precautions use walker/cane & assistance as needed   Diet:  Heart Healthy  For Heart failure patients - Check your Weight same time everyday, if you gain over 2 pounds, or you develop in leg swelling, experience more shortness of breath or chest pain, call your Primary MD immediately. Follow Cardiac Low Salt Diet and 1.8 lit/day fluid restriction.  Disposition Home   If you experience worsening of your admission symptoms, develop shortness of breath, life threatening emergency, suicidal or homicidal thoughts you must seek medical attention immediately by calling 911 or calling your MD immediately  if symptoms less severe.  You Must read complete instructions/literature along with all the possible adverse reactions/side effects for all the Medicines you take and that have been prescribed to you. Take any new Medicines after you have completely understood and accpet all the possible  adverse reactions/side effects.   Do not drive and provide baby sitting services if your were admitted for syncope or siezures until you have seen by Primary MD or a Neurologist and advised to do so again.  Do not drive when taking Pain medications.    Do not take more than prescribed Pain, Sleep and Anxiety Medications  Special Instructions: If you have smoked or chewed Tobacco  in the last 2 yrs please stop smoking, stop any regular Alcohol  and or any Recreational drug use.  Wear Seat belts while driving.  Follow-up Information    Follow up with Florentina Jenny, MD. Schedule an appointment as soon as possible for a visit in 2 days.   Contact information:   [address] [place] [address] [phone]       Follow up with Freddy Jaksch, MD. Schedule an appointment as soon as possible for a visit in 1 week.   Contact information:   [address] [place] [address] [phone]            Discharge Medications     Medication List     As of 12/08/2012  8:56 AM    START taking these medications         metroNIDAZOLE 500 MG tablet   Commonly known as: FLAGYL   Take 1 tablet (500 mg total) by mouth 3 (three) times daily.      vancomycin 50 mg/mL oral solution   Commonly known as: VANCOCIN   Take 2.5 mLs (125 mg total) by mouth every 6 (six) hours. 2 week supply      CONTINUE taking these medications         alendronate 70 MG tablet   Commonly known as: FOSAMAX      ALPRAZolam 0.5 MG tablet   Commonly known as: XANAX      amLODipine 10 MG tablet   Commonly known as: NORVASC      butalbital-acetaminophen-caffeine 50-325-40 MG per  tablet   Commonly known as: FIORICET, ESGIC      carvedilol 12.5 MG tablet   Commonly known as: COREG      cholecalciferol 1000 UNITS tablet   Commonly known as: VITAMIN D      enalapril 20 MG tablet   Commonly known as: VASOTEC      ferrous sulfate 325 (65 FE) MG tablet      fluocinonide  ointment 0.05 %   Commonly known as: LIDEX      HYDROcodone-acetaminophen 7.5-500 MG per tablet   Commonly known as: LORTAB      multivitamin with minerals Tabs      polycarbophil 625 MG tablet   Commonly known as: FIBERCON      pravastatin 20 MG tablet   Commonly known as: PRAVACHOL      silodosin 8 MG Caps capsule   Commonly known as: RAPAFLO      STOP taking these medications         omeprazole 40 MG capsule   Commonly known as: PRILOSEC          Where to get your medications    These are the prescriptions that you need to pick up.   You may get these medications from any pharmacy.         metroNIDAZOLE 500 MG tablet   vancomycin 50 mg/mL oral solution               Total Time in preparing paper work, data evaluation and todays exam - 35 minutes  Leroy Sea M.D on 12/08/2012 at 8:56 AM  [facility] Office  [phone]

## 2012-12-08 NOTE — Care Management (Addendum)
CARE MANAGEMENT NOTE 12/08/2012  Patient:  Frank Kramer, Frank Kramer   Account Number:  [id number]  Date Initiated:  12/05/2012  Documentation initiated by:  [facility]  Subjective/Objective Assessment:   79 year old male admitted with LGI bleed, also found to be c-diff +.     Action/Plan:   Admitted from home, should return there at d/c.   Anticipated DC Date:  12/08/2012   Anticipated DC Plan:  HOME/SELF CARE  In-house referral  NA      DC Planning Services  CM consult      [facility] Choice  HOME HEALTH   Choice offered to / List presented to:  C-4 Adult Children   DME arranged  NA      DME agency  NA     HH arranged  HH-1 RN  HH-4 NURSE'S AIDE      HH agency  [facility]   Status of service:  Completed, signed off Medicare Important Message given?   (If response is "NO", the following Medicare IM given date fields will be blank) Date Medicare IM given:   Date Additional Medicare IM given:    Discharge Disposition:  HOME W HOME HEALTH SERVICES  Per UR Regulation:  Reviewed for med. necessity/level of care/duration of stay  If discussed at Long Length of Stay Meetings, dates discussed:    Comments:  12/08/12 1007 Shanese Riemenschneider,RN,BSN [phone] Cm spoke with pt whom gave permission for Cm to speak with adult daughter Carney Bern concerning dc planning.Pt & family request [facility] ( Dx management)/[facility] to assist with home care. Per pt's daughter Carney Bern [facility] to provide [facility] services upon discharge [facility] rep Norberta Keens notified of new referra. MD orders entered. Pt has DME for home use. Pt's son to provide tx home.

## 2012-12-09 ENCOUNTER — Other Ambulatory Visit: Payer: Medicare Other | Admitting: Lab

## 2012-12-10 ENCOUNTER — Encounter (HOSPITAL_COMMUNITY): Payer: Self-pay | Admitting: *Deleted

## 2012-12-10 ENCOUNTER — Inpatient Hospital Stay (HOSPITAL_COMMUNITY)
Admission: EM | Admit: 2012-12-10 | Discharge: 2012-12-15 | DRG: 378 | Disposition: A | Discharge status: Home / Self Care | Payer: Medicare Other | Attending: Internal Medicine | Admitting: Internal Medicine

## 2012-12-10 DIAGNOSIS — M889 Osteitis deformans of unspecified bone: Secondary | ICD-10-CM

## 2012-12-10 DIAGNOSIS — F419 Anxiety disorder, unspecified: Secondary | ICD-10-CM

## 2012-12-10 DIAGNOSIS — Z87891 Personal history of nicotine dependence: Secondary | ICD-10-CM

## 2012-12-10 DIAGNOSIS — F411 Generalized anxiety disorder: Secondary | ICD-10-CM | POA: Diagnosis present

## 2012-12-10 DIAGNOSIS — E860 Dehydration: Secondary | ICD-10-CM | POA: Diagnosis present

## 2012-12-10 DIAGNOSIS — K625 Hemorrhage of anus and rectum: Secondary | ICD-10-CM

## 2012-12-10 DIAGNOSIS — K219 Gastro-esophageal reflux disease without esophagitis: Secondary | ICD-10-CM | POA: Diagnosis present

## 2012-12-10 DIAGNOSIS — K529 Noninfective gastroenteritis and colitis, unspecified: Secondary | ICD-10-CM

## 2012-12-10 DIAGNOSIS — K922 Gastrointestinal hemorrhage, unspecified: Principal | ICD-10-CM | POA: Diagnosis present

## 2012-12-10 DIAGNOSIS — M1990 Unspecified osteoarthritis, unspecified site: Secondary | ICD-10-CM

## 2012-12-10 DIAGNOSIS — C8589 Other specified types of non-Hodgkin lymphoma, extranodal and solid organ sites: Secondary | ICD-10-CM | POA: Diagnosis present

## 2012-12-10 DIAGNOSIS — D62 Acute posthemorrhagic anemia: Secondary | ICD-10-CM | POA: Diagnosis present

## 2012-12-10 DIAGNOSIS — I951 Orthostatic hypotension: Secondary | ICD-10-CM

## 2012-12-10 DIAGNOSIS — D649 Anemia, unspecified: Secondary | ICD-10-CM

## 2012-12-10 DIAGNOSIS — A0472 Enterocolitis due to Clostridium difficile, not specified as recurrent: Secondary | ICD-10-CM | POA: Diagnosis present

## 2012-12-10 DIAGNOSIS — I10 Essential (primary) hypertension: Secondary | ICD-10-CM | POA: Diagnosis present

## 2012-12-10 DIAGNOSIS — J310 Chronic rhinitis: Secondary | ICD-10-CM

## 2012-12-10 DIAGNOSIS — D472 Monoclonal gammopathy: Secondary | ICD-10-CM

## 2012-12-10 DIAGNOSIS — D630 Anemia in neoplastic disease: Secondary | ICD-10-CM | POA: Diagnosis present

## 2012-12-10 HISTORY — DX: Enterocolitis due to Clostridium difficile, not specified as recurrent: A04.72

## 2012-12-10 LAB — PROTIME-INR
INR: 1.24 (ref 0.00–1.49)
Prothrombin Time: 15.4 seconds — ABNORMAL HIGH (ref 11.6–15.2)

## 2012-12-10 LAB — COMPREHENSIVE METABOLIC PANEL
ALT: 29 U/L (ref 0–53)
AST: 26 U/L (ref 0–37)
Albumin: 3.6 g/dL (ref 3.5–5.2)
Alkaline Phosphatase: 43 U/L (ref 39–117)
Potassium: 3.8 mEq/L (ref 3.5–5.1)
Sodium: 142 mEq/L (ref 135–145)
Total Protein: 6.3 g/dL (ref 6.0–8.3)

## 2012-12-10 LAB — CBC
HCT: 25.7 % — ABNORMAL LOW (ref 39.0–52.0)
Hemoglobin: 9.3 g/dL — ABNORMAL LOW (ref 13.0–17.0)
Hemoglobin: 8.3 g/dL — ABNORMAL LOW (ref 13.0–17.0)
MCH: 34.4 pg — ABNORMAL HIGH (ref 26.0–34.0)
MCHC: 36 g/dL (ref 30.0–36.0)
MCHC: 35.6 g/dL (ref 30.0–36.0)
MCV: 96.6 fL (ref 78.0–100.0)
MCV: 96.7 fL (ref 78.0–100.0)
RBC: 2.41 MIL/uL — ABNORMAL LOW (ref 4.22–5.81)
RDW: 15 % (ref 11.5–15.5)
WBC: 4.4 10*3/uL (ref 4.0–10.5)

## 2012-12-10 LAB — TYPE AND SCREEN
ABO/RH(D): O POS
Antibody Screen: NEGATIVE

## 2012-12-10 MED ORDER — SODIUM CHLORIDE 0.9 % IJ SOLN
3.0000 mL | Freq: Two times a day (BID) | INTRAMUSCULAR | Status: DC
  Administered 2012-12-11 – 2012-12-13 (×4): 3 mL via INTRAVENOUS

## 2012-12-10 MED ORDER — ONDANSETRON HCL 4 MG PO TABS
4.0000 mg | ORAL_TABLET | Freq: Four times a day (QID) | ORAL | Status: DC | PRN
Start: 2012-12-10 — End: 2012-12-15

## 2012-12-10 MED ORDER — HYDROCODONE-ACETAMINOPHEN 5-325 MG PO TABS
1.0000 | ORAL_TABLET | Freq: Four times a day (QID) | ORAL | Status: DC | PRN
  Administered 2012-12-11 – 2012-12-15 (×2): 1 via ORAL
  Filled 2012-12-10 (×3): qty 1

## 2012-12-10 MED ORDER — BUTALBITAL-APAP-CAFFEINE 50-325-40 MG PO TABS: 1.0000 | ORAL_TABLET | Freq: Two times a day (BID) | ORAL | Status: DC | PRN

## 2012-12-10 MED ORDER — SODIUM CHLORIDE 0.9 % IV SOLN
INTRAVENOUS | Status: AC
  Administered 2012-12-10 – 2012-12-11 (×2): via INTRAVENOUS

## 2012-12-10 MED ORDER — TAMSULOSIN HCL 0.4 MG PO CAPS
0.4000 mg | ORAL_CAPSULE | Freq: Every day | ORAL | Status: DC
  Administered 2012-12-10 – 2012-12-15 (×6): 0.4 mg via ORAL
  Filled 2012-12-10 (×6): qty 1

## 2012-12-10 MED ORDER — SODIUM CHLORIDE 0.9 % IV SOLN
INTRAVENOUS | Status: DC
  Administered 2012-12-10: 15:00:00 via INTRAVENOUS

## 2012-12-10 MED ORDER — ACETAMINOPHEN 325 MG PO TABS
650.0000 mg | ORAL_TABLET | Freq: Four times a day (QID) | ORAL | Status: DC | PRN
  Administered 2012-12-14: 650 mg via ORAL
  Filled 2012-12-10: qty 2

## 2012-12-10 MED ORDER — CARVEDILOL 12.5 MG PO TABS
12.5000 mg | ORAL_TABLET | Freq: Two times a day (BID) | ORAL | Status: DC
  Administered 2012-12-10 – 2012-12-14 (×9): 12.5 mg via ORAL
  Filled 2012-12-10 (×11): qty 1

## 2012-12-10 MED ORDER — SACCHAROMYCES BOULARDII 250 MG PO CAPS
250.0000 mg | ORAL_CAPSULE | Freq: Two times a day (BID) | ORAL | Status: DC
  Administered 2012-12-10 – 2012-12-15 (×10): 250 mg via ORAL
  Filled 2012-12-10 (×11): qty 1

## 2012-12-10 MED ORDER — SIMVASTATIN 10 MG PO TABS
10.0000 mg | ORAL_TABLET | ORAL | Status: DC
  Administered 2012-12-11 – 2012-12-15 (×4): 10 mg via ORAL
  Filled 2012-12-10 (×2): qty 1

## 2012-12-10 MED ORDER — VANCOMYCIN 50 MG/ML ORAL SOLUTION
125.0000 mg | Freq: Four times a day (QID) | ORAL | Status: DC
  Administered 2012-12-10 – 2012-12-15 (×18): 125 mg via ORAL
  Filled 2012-12-10 (×23): qty 2.5

## 2012-12-10 MED ORDER — ACETAMINOPHEN 650 MG RE SUPP: 650.0000 mg | Freq: Four times a day (QID) | RECTAL | Status: DC | PRN

## 2012-12-10 MED ORDER — ALBUTEROL SULFATE (5 MG/ML) 0.5% IN NEBU: 2.5000 mg | INHALATION_SOLUTION | RESPIRATORY_TRACT | Status: DC | PRN

## 2012-12-10 MED ORDER — ONDANSETRON HCL 4 MG/2ML IJ SOLN: 4.0000 mg | Freq: Four times a day (QID) | INTRAMUSCULAR | Status: DC | PRN

## 2012-12-10 MED ORDER — SODIUM CHLORIDE 0.9 % IV BOLUS (SEPSIS)
500.0000 mL | Freq: Once | INTRAVENOUS | Status: AC
  Administered 2012-12-10: 500 mL via INTRAVENOUS

## 2012-12-10 MED ORDER — ALPRAZOLAM 0.5 MG PO TABS
0.5000 mg | ORAL_TABLET | Freq: Two times a day (BID) | ORAL | Status: DC | PRN
  Administered 2012-12-11 – 2012-12-15 (×6): 0.5 mg via ORAL
  Filled 2012-12-10 (×6): qty 1

## 2012-12-10 MED ORDER — METRONIDAZOLE 500 MG PO TABS
500.0000 mg | ORAL_TABLET | Freq: Three times a day (TID) | ORAL | Status: DC
  Administered 2012-12-10 – 2012-12-15 (×15): 500 mg via ORAL
  Filled 2012-12-10 (×18): qty 1

## 2012-12-10 NOTE — Progress Notes (Signed)
[facility]  Patient Status: Active (receiving services up to time of hospitalization)  [facility] is providing the following services: RN and [facility] - referral from [facility] on 2/4  If patient discharges after hours, please call [phone].   Jodene Nam 12/10/2012, 4:51 PM

## 2012-12-10 NOTE — ED Notes (Signed)
Pt alert and mentating appropriately. Pt denies pain. Pt denies dizziness/lightheadedness. Pt denies nausea. Pt denies numbness and tingling. Pt states he feels weak.

## 2012-12-10 NOTE — ED Notes (Signed)
Report taken from Monica, RN  

## 2012-12-10 NOTE — ED Notes (Signed)
Report given to floor nurse, Maureen Ralphs, RN. Nurse has no further questions upon report given. Pt being transported to floor by Serita Sheller, EMT

## 2012-12-10 NOTE — ED Provider Notes (Addendum)
History     CSN: [id number]  Arrival date & time 12/10/12  1046   First MD Initiated Contact with Patient 12/10/12 1054      Chief Complaint  Patient presents with  . GI Bleeding    (Consider location/radiation/quality/duration/timing/severity/associated sxs/prior treatment) The history is provided by the patient.  pt with hx diverticula, s/p recent hospitalization w d/c home 2/4 w gi bleed and c diff colitis, presets w 6-7 bloody bms today, dark blood, v loose/watery.  States yesterday had single bm, diarrhea. Is taking flagyl and vanc as prescribed during recent hospitalization. No abd pain. No nv. No fever or chills. Denies faintness or dizziness. No other abn bleeding or bruising. No anticoag, asa or nsaid use.      Past Medical History  Diagnosis Date  . Arthritis   . Hypertension   . High cholesterol   . GERD (gastroesophageal reflux disease)   . Panic     panic disorder  . MGUS (monoclonal gammopathy of unknown significance) 06/07/2012  . Rhinitis 06/07/2012  . Paget's bone disease   . Skin cancer   . Non Hodgkin's lymphoma dx'd 1997    Past Surgical History  Procedure Date  . Back surgery   . Neck surgery     c2  . Skin biopsy   . Hernia repair   . Prostatectomy     No family history on file.  History  Substance Use Topics  . Smoking status: Former Smoker    Quit date: 11/20/1968  . Smokeless tobacco: Never Used  . Alcohol Use: No      Review of Systems  Constitutional: Negative for fever.  HENT: Negative for neck pain.   Eyes: Negative for redness.  Respiratory: Negative for shortness of breath.   Cardiovascular: Negative for chest pain.  Gastrointestinal: Positive for blood in stool. Negative for abdominal pain.  Genitourinary: Negative for flank pain.  Musculoskeletal: Negative for back pain.  Skin: Negative for rash.  Neurological: Negative for headaches.  Hematological: Does not bruise/bleed easily.  Psychiatric/Behavioral: Negative for  confusion.    Allergies  Aspirin; Codeine; Iohexol; Macrodantin; and Morphine and related  Home Medications   Current Outpatient Rx  Name  Route  Sig  Dispense  Refill  . ALENDRONATE SODIUM 70 MG PO TABS   Oral   Take 70 mg by mouth every 7 (seven) days. Take with a full glass of water on an empty stomach. Takes on Saturdays.         . ALPRAZOLAM 0.5 MG PO TABS   Oral   Take 0.5 mg by mouth 2 (two) times daily as needed. anxiety         . AMLODIPINE BESYLATE 10 MG PO TABS   Oral   Take 10 mg by mouth at bedtime.         Marland Kitchen BUTALBITAL-APAP-CAFFEINE 50-325-40 MG PO TABS   Oral   Take 1 tablet by mouth 2 (two) times daily as needed. For headaches.         Marland Kitchen CARVEDILOL 12.5 MG PO TABS   Oral   Take 12.5 mg by mouth 2 (two) times daily.         Marland Kitchen VITAMIN D 1000 UNITS PO TABS   Oral   Take 1,000 Units by mouth daily.         . ENALAPRIL MALEATE 20 MG PO TABS   Oral   Take 20 mg by mouth 2 (two) times daily.         Marland Kitchen  FERROUS SULFATE 325 (65 FE) MG PO TABS   Oral   Take 325 mg by mouth daily.          Marland Kitchen FLUOCINONIDE 0.05 % EX OINT   Topical   Apply 1 application topically 2 (two) times daily. rash         . HYDROCODONE-ACETAMINOPHEN 7.5-500 MG PO TABS   Oral   Take 1 tablet by mouth every 6 (six) hours as needed. For pain.         Marland Kitchen METRONIDAZOLE 500 MG PO TABS   Oral   Take 1 tablet (500 mg total) by mouth 3 (three) times daily.   45 tablet   0   . ADULT MULTIVITAMIN W/MINERALS CH   Oral   Take 1 tablet by mouth daily.         Marland Kitchen CALCIUM POLYCARBOPHIL 625 MG PO TABS   Oral   Take 625 mg by mouth daily.         Marland Kitchen PRAVASTATIN SODIUM 20 MG PO TABS   Oral   Take 20 mg by mouth 3 (three) times a week. Takes on Mon, Wed, Fri.         Marland Kitchen SILODOSIN 8 MG PO CAPS   Oral   Take 8 mg by mouth daily with breakfast.         . VANCOMYCIN 50 MG/ML ORAL SOLUTION   Oral   Take 2.5 mLs (125 mg total) by mouth every 6 (six) hours. 2 week  supply   20 mL   2     There were no vitals taken for this visit.  Physical Exam  Nursing note and vitals reviewed. Constitutional: He is oriented to person, place, and time. He appears well-developed and well-nourished. No distress.  HENT:  Head: Atraumatic.  Mouth/Throat: Oropharynx is clear and moist.  Eyes: Conjunctivae normal are normal. No scleral icterus.  Neck: Neck supple. No tracheal deviation present.  Cardiovascular: Normal rate, regular rhythm, normal heart sounds and intact distal pulses.   Pulmonary/Chest: Effort normal and breath sounds normal. No accessory muscle usage. No respiratory distress.  Abdominal: Soft. Bowel sounds are normal. He exhibits no distension and no mass. There is no tenderness. There is no rebound and no guarding.  Genitourinary:       Small amt dark blood on rectal, no stool. Heme pos.   Musculoskeletal: Normal range of motion. He exhibits no edema and no tenderness.  Neurological: He is alert and oriented to person, place, and time.  Skin: Skin is warm and dry.  Psychiatric: He has a normal mood and affect.    ED Course  Procedures (including critical care time)   Results for orders placed during the hospital encounter of 12/10/12  TYPE AND SCREEN      Component Value Range   ABO/RH(D) O POS     Antibody Screen NEG     Sample Expiration 12/13/2012    PROTIME-INR      Component Value Range   Prothrombin Time 15.4 (*) 11.6 - 15.2 seconds   INR 1.24  0.00 - 1.49  CBC      Component Value Range   WBC 4.6  4.0 - 10.5 K/uL   RBC 2.78 (*) 4.22 - 5.81 MIL/uL   Hemoglobin 9.6 (*) 13.0 - 17.0 g/dL   HCT 27.2 (*) 53.6 - 64.4 %   MCV 96.0  78.0 - 100.0 fL   MCH 34.5 (*) 26.0 - 34.0 pg   MCHC 36.0  30.0 -  36.0 g/dL   RDW 78.2  95.6 - 21.3 %   Platelets 122 (*) 150 - 400 K/uL  COMPREHENSIVE METABOLIC PANEL      Component Value Range   Sodium 142  135 - 145 mEq/L   Potassium 3.8  3.5 - 5.1 mEq/L   Chloride 107  96 - 112 mEq/L   CO2 28   19 - 32 mEq/L   Glucose, Bld 103 (*) 70 - 99 mg/dL   BUN 10  6 - 23 mg/dL   Creatinine, Ser 0.86  0.50 - 1.35 mg/dL   Calcium 9.3  8.4 - 57.8 mg/dL   Total Protein 6.3  6.0 - 8.3 g/dL   Albumin 3.6  3.5 - 5.2 g/dL   AST 26  0 - 37 U/L   ALT 29  0 - 53 U/L   Alkaline Phosphatase 43  39 - 117 U/L   Total Bilirubin 0.4  0.3 - 1.2 mg/dL   GFR calc non Af Amer 61 (*) >90 mL/min   GFR calc Af Amer 71 (*) >90 mL/min  OCCULT BLOOD, POC DEVICE      Component Value Range   Fecal Occult Bld POSITIVE (*) NEGATIVE       MDM  Iv ns. Labs.   Reviewed nursing notes and prior charts for additional history.   Given several episodes rectal bleeding today, orthostatis, will admit.. hgb c/w prior.  Med service called to admit.   [facility] states temp orders, admit, team 7, requests gi consult.  Gi called.   Discussed pt with Dr Dulce Sellar, he will consult.        Suzi Roots, MD 12/10/12 1254  Suzi Roots, MD 12/10/12 1317  Suzi Roots, MD 12/10/12 1346

## 2012-12-10 NOTE — ED Notes (Signed)
Pt placed on enteric precautions per admitting doctors order

## 2012-12-10 NOTE — ED Notes (Signed)
C/o bloody stools. Discharged from [facility] for same 12-08-12. Statets started back again this am. Denies pain

## 2012-12-10 NOTE — H&P (Signed)
[facility] History and Physical  Frank Kramer:[id number] DOB: 1922-02-29 DOA: 12/10/2012  Referring physician: Dr. Myrtis Hopping, EDP PCP: Darrow Bussing, MD  Specialists: GI: Dr. Herbert Moors.  Chief Complaint: Rectal bleeding  HPI: Frank Kramer is a 79 y.o. male was recently discharged from hospital on 12/08/12 after evaluation and treatment for C. difficile colitis associated with rectal bleeding. Gastroenterology has consulted and no procedures were performed. Diarrhea had decreased. Patient indicated that he had 2 BMs yesterday. However since 4 AM on 2/6, he started having multiple episodes of rectal bleeding. He says he's had at least 12 episodes thus far. BM definitely has bright red blood and unknown amount of stool. He complains that his abdomen is sore. No nausea, vomiting, fever or chills. He has a good appetite but has not eaten because of rectal bleeding. Feels generally weak but denies dizziness, lightheadedness or fall with ambulation. He presented to the emergency department where he was orthostatic but hemoglobin was stable compared to recent discharge. Colonoscopy 2008 showed sigmoid colon diverticula. Hospitalist service is requested to admit for further evaluation and management. [facility] have been consulted by EDP. Patient does have PMH of HTN, MGUS, dyslipidemia, anxiety and GERD  Review of Systems: All systems reviewed and apart from history of presenting illness, are negative.  Past Medical History  Diagnosis Date  . Arthritis   . Hypertension   . High cholesterol   . GERD (gastroesophageal reflux disease)   . Panic     panic disorder  . MGUS (monoclonal gammopathy of unknown significance) 06/07/2012  . Rhinitis 06/07/2012  . Paget's bone disease   . Skin cancer   . Non Hodgkin's lymphoma dx'd 1997  . C. difficile colitis    Past Surgical History  Procedure Date  . Back surgery   . Neck surgery     c2  . Skin biopsy   . Hernia repair   . Prostatectomy     Social History:  reports that he quit smoking about 44 years ago. He has never used smokeless tobacco. He reports that he does not drink alcohol or use illicit drugs. Married. Lives with his wife. Ambulates with the help of 2 canes.  Allergies  Allergen Reactions  . Aspirin     GI bleeding  . Codeine Other (See Comments)    Patient cannot recall reaction  . Iohexol      Desc: HAS SEIZURES WITH X-RAY DYE-GIVEN 120 MG SOLU0MEDROL, 25 MG BENADRYL, AND 25 MG PEPCID 1 HOUR PRIOR TO EXAM AND HAD NO PROBLEMS-ARS-08/15/07   . Macrodantin Other (See Comments)    Patient cannot recall reaction  . Morphine And Related Itching    No family history on file. negative history.  Prior to Admission medications   Medication Sig Start Date End Date Taking? Authorizing Provider  alendronate (FOSAMAX) 70 MG tablet Take 70 mg by mouth every 7 (seven) days. Take with a full glass of water on an empty stomach. Takes on Saturdays.   Yes Historical Provider, MD  ALPRAZolam Prudy Feeler) 0.5 MG tablet Take 0.5 mg by mouth 2 (two) times daily as needed. anxiety   Yes Historical Provider, MD  amLODipine (NORVASC) 10 MG tablet Take 10 mg by mouth at bedtime.   Yes Historical Provider, MD  butalbital-acetaminophen-caffeine (FIORICET, ESGIC) 50-325-40 MG per tablet Take 1 tablet by mouth 2 (two) times daily as needed. For headaches.   Yes Historical Provider, MD  carvedilol (COREG) 12.5 MG tablet Take 12.5 mg by mouth  2 (two) times daily.   Yes Historical Provider, MD  cholecalciferol (VITAMIN D) 1000 UNITS tablet Take 1,000 Units by mouth daily.   Yes Historical Provider, MD  enalapril (VASOTEC) 20 MG tablet Take 20 mg by mouth 2 (two) times daily.   Yes Historical Provider, MD  ferrous sulfate 325 (65 FE) MG tablet Take 325 mg by mouth daily.    Yes Historical Provider, MD  fluocinonide ointment (LIDEX) 0.05 % Apply 1 application topically 2 (two) times daily. rash   Yes Historical Provider, MD   HYDROcodone-acetaminophen (LORTAB) 7.5-500 MG per tablet Take 1 tablet by mouth every 6 (six) hours as needed. For pain.   Yes Historical Provider, MD  metroNIDAZOLE (FLAGYL) 500 MG tablet Take 1 tablet (500 mg total) by mouth 3 (three) times daily. 12/08/12  Yes Leroy Sea, MD  Multiple Vitamin (MULTIVITAMIN WITH MINERALS) TABS Take 1 tablet by mouth daily.   Yes Historical Provider, MD  polycarbophil (FIBERCON) 625 MG tablet Take 625 mg by mouth daily.   Yes Historical Provider, MD  pravastatin (PRAVACHOL) 20 MG tablet Take 20 mg by mouth 3 (three) times a week. Takes on Mon, Wed, Fri. 06/01/12  Yes Historical Provider, MD  silodosin (RAPAFLO) 8 MG CAPS capsule Take 8 mg by mouth daily with breakfast.   Yes Historical Provider, MD  vancomycin (VANCOCIN) 50 mg/mL oral solution Take 2.5 mLs (125 mg total) by mouth every 6 (six) hours. 2 week supply 12/08/12  Yes Leroy Sea, MD   Physical Exam: Filed Vitals:   12/10/12 1236 12/10/12 1239 12/10/12 1422 12/10/12 1445  BP: 117/64 108/55 152/80 150/67  Pulse:  109 104 98  Temp:    98.2 F (36.8 C)  TempSrc:    Oral  Resp:    16  SpO2:   100% 100%     General exam: Moderately built and frail male patient lying comfortably supine in bed without distress.  Head, eyes and ENT: Nontraumatic and normocephalic. Pupils equally reacting to light and accommodation. Oral mucosa mildly dry.  Lymphatics: No lymphadenopathy.  Neck: Supple. No JVD, carotid bruit or thyromegaly.  Respiratory: Clear. No increased work of breathing.  Cardiovascular: S1 and S2 heard, RRR. No JVD, murmurs, gallops, clicks or pedal edema.  Gastrointestinal system: Abdomen is nondistended and soft. Mild tenderness in suprapubic and left lower quadrants without rigidity, guarding or rebound. No organomegaly or masses appreciated. Normal bowel sounds heard.  Central nervous system: Alert and oriented. No focal neurological deficits.  Extremities: Symmetric 5 x 5  power. Peripheral pulses symmetrically felt.  Skin: No acute findings.  Muscular skeletal system exam: Negative.  Psychiatry: Pleasant and cooperative.  Labs on Admission:  Basic Metabolic Panel:  Lab 12/10/12 9562 12/08/12 0435 12/07/12 0455 12/05/12 0513 12/04/12 1053  NA 142 -- 138 138 137  K 3.8 3.6 3.4* 3.8 3.8  CL 107 -- 107 107 103  CO2 28 -- 24 26 27   GLUCOSE 103* -- 106* 97 101*  BUN 10 -- 6 13 18   CREATININE 1.05 -- 0.94 0.91 0.97  CALCIUM 9.3 -- 8.2* 8.3* 9.4  MG -- -- -- -- --  PHOS -- -- -- -- --   Liver Function Tests:  Lab 12/10/12 1121 12/04/12 1053  AST 26 17  ALT 29 15  ALKPHOS 43 61  BILITOT 0.4 0.6  PROT 6.3 6.9  ALBUMIN 3.6 4.0   No results found for this basename: LIPASE:5,AMYLASE:5 in the last 168 hours No results found for this  basename: AMMONIA:5 in the last 168 hours CBC:  Lab 12/10/12 1121 12/08/12 0435 12/07/12 0455 12/06/12 0830 12/05/12 1800 12/05/12 0513 12/04/12 1053  WBC 4.6 -- 3.3* -- -- 3.8* 4.6  NEUTROABS -- -- 1.9 -- -- -- 3.1  HGB 9.6* 9.1* 8.9* 9.4* 10.2* -- --  HCT 26.7* 25.5* 24.8* 27.1* 28.9* -- --  MCV 96.0 -- 95.8 -- -- 96.2 95.7  PLT 122* -- 91* -- -- 113* 118*   Cardiac Enzymes: No results found for this basename: CKTOTAL:5,CKMB:5,CKMBINDEX:5,TROPONINI:5 in the last 168 hours  BNP (last 3 results) No results found for this basename: PROBNP:3 in the last 8760 hours CBG: No results found for this basename: GLUCAP:5 in the last 168 hours  Radiological Exams on Admission: No results found.    Assessment/Plan Principal Problem:  *LGI bleed Active Problems:  HTN (hypertension)  GERD (gastroesophageal reflux disease)  Anemia  MGUS (monoclonal gammopathy of unknown significance)  C. difficile colitis  Dehydration  Orthostatic hypotension   1. Acute lower GI bleeding/? Bloody diarrhea: DD-C. difficile colitis, diverticular bleeding versus other etiologies. Although patient has had several bloody BMs,  hemoglobin is stable suggesting mild bleeding or hemoconcentration from dehydration. Admit to inpatient for further evaluation and management. Continue oral vancomycin and Flagyl (unclear if he needs stool therapy and if Flagyl can be discontinued). Add probiotics. Clear liquid diet, in case any procedures planned. [facility] has been consulted and will await their recommendations. Patient not on any blood thinners. 2. Anemia: Chronic. Monitor closely for acute blood loss anemia. Serial CBCs and transfuse if hemoglobin less than 8 g/dL. 3. Orthostatic hypotension: Likely secondary to problem #1 and dehydration. Continue IV fluids. Hold amlodipine and lisinopril. Monitor closely. 4. Dehydration: Secondary to poor oral intake. Brief IV fluids. 5. C. difficile colitis: Management as above. 6. Hypertension: Soft blood pressures. Continue carvedilol to avoid rebound tachycardia. Hold amlodipine and lisinopril. Monitor closely. 7. GERD: PPI's were discontinued on previous admission due to C. difficile colitis. 8. Anxiety: Continue home when necessary benzodiazepines.     Code Status: Full Family Communication:  discussed with patient and son-in-law at the bedside.  Disposition Plan:  at least 2 midnights stay.  Time spent:  65 minutes  [facility] Pager [phone]   If 7PM-7AM, please contact night-coverage [email or web address] Password [facility] 12/10/2012, 3:07 PM

## 2012-12-10 NOTE — Progress Notes (Signed)
Subjective: Patient readmitted for hematochezia.  Has stool surrounded by small amount of maroon blood in bedpan. Recently discharged after prolonged course of bloody diarrhea.  Diarrhea has improved with antibiotics for C. Diff.  Objective: Vital signs in last 24 hours: Temp:  [98.2 F (36.8 C)-98.3 F (36.8 C)] 98.2 F (36.8 C) (02/06 1445) Pulse Rate:  [89-109] 98  (02/06 1445) Resp:  [16-18] 16  (02/06 1445) BP: (108-152)/(55-80) 150/67 mmHg (02/06 1445) SpO2:  [100 %] 100 % (02/06 1445) Weight change:    PE: GEN:  Thin, bit cachectic appearing, but is in no acute distress. ABD:  Soft, mild lower abdominal tenderness  Lab Results: CBC    Component Value Date/Time   WBC 4.6 12/10/2012 1121   WBC 3.6* 05/29/2012 0943   RBC 2.78* 12/10/2012 1121   RBC 3.94* 05/29/2012 0943   HGB 9.6* 12/10/2012 1121   HGB 13.0 05/29/2012 0943   HCT 26.7* 12/10/2012 1121   HCT 37.0* 05/29/2012 0943   PLT 122* 12/10/2012 1121   PLT 113* 05/29/2012 0943   MCV 96.0 12/10/2012 1121   MCV 94.0 05/29/2012 0943   MCH 34.5* 12/10/2012 1121   MCH 33.0 05/29/2012 0943   MCHC 36.0 12/10/2012 1121   MCHC 35.1 05/29/2012 0943   RDW 15.0 12/10/2012 1121   RDW 16.1* 05/29/2012 0943   LYMPHSABS 0.9 12/07/2012 0455   LYMPHSABS 0.7* 05/29/2012 0943   MONOABS 0.3 12/07/2012 0455   MONOABS 0.5 05/29/2012 0943   EOSABS 0.2 12/07/2012 0455   EOSABS 0.0 05/29/2012 0943   BASOSABS 0.0 12/07/2012 0455   BASOSABS 0.0 05/29/2012 0943   Assessment:  1.  Hematochezia, recurrent.  Differential including diverticular bleeding.  I am mindful of recent CT showing cecal, ascending and descending colon edema, but volume of intermittent bleeding he describes is not typical of infectious, inflammatory or ischemic colitis.  I am mindful of positive RBC tagged study in LU[facility] in suspected area of mid-jejunum in January 2013, during similar episode of bleeding. 2.  C. Diff colitis.  Overall much improved on antibiotics.  Plan:  1.  Patient's bleeding  appears to have slowed down (again).   2.  Would pursue medical management with serial CBCs, IVF, and overall expectant management. 3.  When/if patient rebleeds, would have tagged RBC study be next step in evaluation.  If patient continues to have intermittent spells of bleeding with negative nuclear medicine studies, could consider colonoscopy, but reluctant to do so given patient's age, comorbidities, and extensive diverticulosis unless symptoms absolutely dictate this. 4.  Will follow; thank you for the consult.   Freddy Jaksch 12/10/2012, 3:35 PM

## 2012-12-11 LAB — BASIC METABOLIC PANEL
BUN: 6 mg/dL (ref 6–23)
Calcium: 8.6 mg/dL (ref 8.4–10.5)
Creatinine, Ser: 0.87 mg/dL (ref 0.50–1.35)
GFR calc Af Amer: 87 mL/min — ABNORMAL LOW (ref >90)
GFR calc non Af Amer: 75 mL/min — ABNORMAL LOW (ref >90)

## 2012-12-11 LAB — CBC
HCT: 26.4 % — ABNORMAL LOW (ref 39.0–52.0)
MCH: 33.6 pg (ref 26.0–34.0)
MCHC: 34.5 g/dL (ref 30.0–36.0)
MCV: 97.4 fL (ref 78.0–100.0)
RDW: 15.6 % — ABNORMAL HIGH (ref 11.5–15.5)

## 2012-12-11 MED ORDER — POTASSIUM CHLORIDE CRYS ER 20 MEQ PO TBCR
20.0000 meq | EXTENDED_RELEASE_TABLET | Freq: Two times a day (BID) | ORAL | Status: AC
  Administered 2012-12-11 – 2012-12-12 (×4): 20 meq via ORAL
  Filled 2012-12-11 (×4): qty 1

## 2012-12-11 MED ORDER — PHENOL 1.4 % MT LIQD
1.0000 | OROMUCOSAL | Status: DC | PRN
  Administered 2012-12-11: 1 via OROMUCOSAL
  Filled 2012-12-11: qty 177

## 2012-12-11 MED ORDER — ALUM & MAG HYDROXIDE-SIMETH 200-200-20 MG/5ML PO SUSP: 15.0000 mL | ORAL | Status: DC | PRN

## 2012-12-11 NOTE — Progress Notes (Signed)
Pt still having some bloody stools. RN paged MD on call Elray Mcgregor who stated to continue to monitor pt. Pt is very anxious. Pt was given 0.5mg  of xanax and put back in bed. RN will continue to monitor pt.

## 2012-12-11 NOTE — Evaluation (Signed)
Physical Therapy Evaluation Patient Details Name: Frank Kramer MRN: [id number] DOB: 10-Jun-1922 Today's Date: 12/11/2012 Time: 4098-1191 PT Time Calculation (min): 34 min  PT Assessment / Plan / Recommendation Clinical Impression  Pt adm with lower GI bleed.  Pt doing well with mobililty and just decr activity tolerance.  Will follow acutely but should be able to return home.    PT Assessment  Patient needs continued PT services    Follow Up Recommendations  No PT follow up    Does the patient have the potential to tolerate intense rehabilitation      Barriers to Discharge        Equipment Recommendations  None recommended by PT    Recommendations for Other Services     Frequency Min 3X/week    Precautions / Restrictions Precautions Precautions: Fall Restrictions Weight Bearing Restrictions: No   Pertinent Vitals/Pain Low back pain with incr amb.      Mobility  Bed Mobility Bed Mobility: Supine to Sit;Sitting - Scoot to Edge of Bed Supine to Sit: 6: Modified independent (Device/Increase time);HOB elevated Sitting - Scoot to Edge of Bed: 6: Modified independent (Device/Increase time) Transfers Transfers: Sit to Stand;Stand to Sit Sit to Stand: 6: Modified independent (Device/Increase time);With upper extremity assist;With armrests;From bed;From chair/3-in-1 Stand to Sit: 6: Modified independent (Device/Increase time);With upper extremity assist;With armrests;To bed;To chair/3-in-1 Ambulation/Gait Ambulation/Gait Assistance: 5: Supervision Ambulation Distance (Feet): 200 Feet Assistive device: Rolling walker Gait Pattern: Step-through pattern;Decreased stride length;Trunk flexed Gait velocity: decr    Exercises     PT Diagnosis: Difficulty walking  PT Problem List: Decreased activity tolerance;Decreased mobility PT Treatment Interventions: DME instruction;Gait training;Functional mobility training;Patient/family education;Therapeutic activities;Therapeutic  exercise;Balance training   PT Goals Acute Rehab PT Goals PT Goal Formulation: With patient Time For Goal Achievement: 12/18/12 Potential to Achieve Goals: Good Pt will Ambulate: >150 feet;with modified independence;with least restrictive assistive device PT Goal: Ambulate - Progress: Goal set today  Visit Information  Last PT Received On: 12/11/12 Assistance Needed: +1    Subjective Data  Subjective: Pt states he doesn't know how much longer his wife has to live. Patient Stated Goal: Return home   Prior Functioning  Home Living Lives With: Spouse (wife is ill and w/c bound) Available Help at Discharge:  (Has aide 2-4 hours 7 days a week.) Type of Home: House Home Access: Ramped entrance Home Layout: One level Bathroom Shower/Tub: Walk-in shower (usually sponge bathes) Home Adaptive Equipment: Walker - rolling;Bedside commode/3-in-1;Hand-held shower hose;Shower chair with back;Straight cane;Wheelchair - manual Prior Function Level of Independence: Needs assistance Driving: No Vocation: Retired Comments: Amb with cane or rolling walker.   Communication Communication: No difficulties    Cognition  Cognition Overall Cognitive Status: Appears within functional limits for tasks assessed/performed Arousal/Alertness: Awake/alert Orientation Level: Appears intact for tasks assessed Behavior During Session: [facility] for tasks performed    Extremity/Trunk Assessment Right Lower Extremity Assessment RLE ROM/Strength/Tone: [facility] for tasks assessed Left Lower Extremity Assessment LLE ROM/Strength/Tone: [facility] for tasks assessed   Balance Static Standing Balance Static Standing - Balance Support: No upper extremity supported;During functional activity Static Standing - Level of Assistance: 6: Modified independent (Device/Increase time) Static Standing - Comment/# of Minutes: 5 minutes to wash hands and perform peri care.  End of Session    GP     [facility] 12/11/2012, 9:25 AM  [facility] PT [phone]

## 2012-12-11 NOTE — Progress Notes (Signed)
Subjective: Brown stool this morning; no obvious overt blood. Tolerating liquid diet.  Objective: Vital signs in last 24 hours: Temp:  [97.6 F (36.4 C)-98.2 F (36.8 C)] 98.2 F (36.8 C) (02/07 0430) Pulse Rate:  [71-109] 71  (02/07 0936) Resp:  [16-19] 18  (02/07 0430) BP: (95-175)/(52-80) 140/60 mmHg (02/07 0936) SpO2:  [97 %-100 %] 97 % (02/07 0430) Weight:  [59.1 kg (130 lb 4.7 oz)-59.78 kg (131 lb 12.7 oz)] 59.1 kg (130 lb 4.7 oz) (02/07 0430) Weight change:  Last BM Date: 12/10/12  PE: GEN:  NAD ABD:  Soft  Lab Results: CBC    Component Value Date/Time   WBC 4.5 12/11/2012 0640   WBC 3.6* 05/29/2012 0943   RBC 2.71* 12/11/2012 0640   RBC 3.94* 05/29/2012 0943   HGB 9.1* 12/11/2012 0640   HGB 13.0 05/29/2012 0943   HCT 26.4* 12/11/2012 0640   HCT 37.0* 05/29/2012 0943   PLT 130* 12/11/2012 0640   PLT 113* 05/29/2012 0943   MCV 97.4 12/11/2012 0640   MCV 94.0 05/29/2012 0943   MCH 33.6 12/11/2012 0640   MCH 33.0 05/29/2012 0943   MCHC 34.5 12/11/2012 0640   MCHC 35.1 05/29/2012 0943   RDW 15.6* 12/11/2012 0640   RDW 16.1* 05/29/2012 0943   LYMPHSABS 0.9 12/07/2012 0455   LYMPHSABS 0.7* 05/29/2012 0943   MONOABS 0.3 12/07/2012 0455   MONOABS 0.5 05/29/2012 0943   EOSABS 0.2 12/07/2012 0455   EOSABS 0.0 05/29/2012 0943   BASOSABS 0.0 12/07/2012 0455   BASOSABS 0.0 05/29/2012 0943   Assessment:  1.  Obscure overt GI bleeding.  Suspect diverticular.  Suggestion of possible jejunal source of bleeding on tagged RBC study about one year noted, but precise localization of bleeding site on tagged RBC studies is quite challenging. 2.  Acute blood loss anemia.  Plan:  1.  Advance diet. 2.  If no further bleeding, consider discharge tomorrow.  If further bleeding, would repeat tagged RBC nuclear medicine study as next step in management. 3.  Case discussed with hospitalist team. 4.  Case discussed in detail with patient and, over the telephone, his daughter, who both voiced understanding and  agreement with our approach. 5.  [facility] team to follow.   Freddy Jaksch 12/11/2012, 12:34 PM

## 2012-12-11 NOTE — Progress Notes (Signed)
[facility] PROGRESS NOTE  Frank Kramer ZOX:[id number] DOB: 12/23/1921 DOA: 12/10/2012 PCP: Darrow Bussing, MD  Assessment/Plan  Acute lower GI bleeding Bloody diarrhea: C-difficile colitis, diverticular bleeding versus other etiologies. Although patient has had several bloody BMs, hemoglobin is stable suggesting mild bleeding or hemoconcentration from dehydration. He has been seen by [facility] (Dr. Dulce Sellar) who advocates conservative management.  Avoid colonoscopy if possible.  Advance diet.  Monitor overnight and d/c in am if he is stable.  Anemia Chronic. Currently stable at 9.1.  Transfuse PRN.  Orthostatic hypotension BP now stable.  Will continue to hold amlodipine and lisinopril for now.  Recheck orthostatics  Dehydration Resolved.  Secondary to poor oral intake. Brief IV fluids.  C. difficile colitis Two loose BMs overnight - tinged maroon color. Was discharged on 2/4 with 2 more weeks of PO Vanc and Flagyl.  These have been continued this admission.   Hypertension Soft blood pressures. Continue carvedilol to avoid rebound tachycardia. Hold amlodipine and lisinopril. Monitor closely.  GERD: PPI's were discontinued on previous admission due to C. difficile colitis.  Mylanta PRN  Anxiety: Continue prn benzodiazepines.   Social:  Patient elderly with significant co-morbidities with wife 76 (lung cancer).  Family may benefit from Hospice Consultation if Hospice is not involved yet.  I will check with Palliative to see if Hospice is involved with the family.  Code Status: full Family Communication:  Disposition Plan: to home when appropriate.  (likely 12/12/12)   Consultants:  Gastroenterology  HPI/Subjective: Patient anxious about his wife.  She is 17 with lung cancer and may pass soon.  Patient also anxious about being discharged too soon and potentially having to return to the hospital.  Objective: Filed Vitals:   12/11/12 0430 12/11/12 0445 12/11/12 0450 12/11/12  0936  BP: 133/71 115/64 95/52 140/60  Pulse: 101 100  71  Temp: 98.2 F (36.8 C)     TempSrc: Oral     Resp: 18     Height:      Weight: 59.1 kg (130 lb 4.7 oz)     SpO2: 97%       Intake/Output Summary (Last 24 hours) at 12/11/12 1357 Last data filed at 12/11/12 1007  Gross per 24 hour  Intake    960 ml  Output    250 ml  Net    710 ml   Filed Weights   12/10/12 1900 12/11/12 0430  Weight: 59.78 kg (131 lb 12.7 oz) 59.1 kg (130 lb 4.7 oz)    Exam:   General:  Well developed, thin, pale, elderly male.  A&O, NAD, Sitting up in bed.  Cardiovascular: RRR with systolic male, no lower extremity edema  Respiratory: no w/c/r, no accessory muscle use   Abdomen: Soft, nt, nd, +BS, no masses  Extremities:  No c/c/e, able to move all four.  Data Reviewed: Basic Metabolic Panel:  Lab 12/11/12 8119 12/10/12 1121 12/08/12 0435 12/07/12 0455 12/05/12 0513  NA 140 142 -- 138 138  K 3.4* 3.8 3.6 3.4* 3.8  CL 107 107 -- 107 107  CO2 24 28 -- 24 26  GLUCOSE 99 103* -- 106* 97  BUN 6 10 -- 6 13  CREATININE 0.87 1.05 -- 0.94 0.91  CALCIUM 8.6 9.3 -- 8.2* 8.3*  MG -- -- -- -- --  PHOS -- -- -- -- --   Liver Function Tests:  Lab 12/10/12 1121  AST 26  ALT 29  ALKPHOS 43  BILITOT 0.4  PROT 6.3  ALBUMIN 3.6   CBC:  Lab 12/11/12 0640 12/10/12 2319 12/10/12 1652 12/10/12 1121 12/08/12 0435 12/07/12 0455  WBC 4.5 4.3 4.4 4.6 -- 3.3*  NEUTROABS -- -- -- -- -- 1.9  HGB 9.1* 8.3* 9.3* 9.6* 9.1* --  HCT 26.4* 23.3* 25.7* 26.7* 25.5* --  MCV 97.4 96.7 96.6 96.0 -- 95.8  PLT 130* 123* 120* 122* -- 91*     Recent Results (from the past 240 hour(s))  CLOSTRIDIUM DIFFICILE BY PCR     Status: Abnormal   Collection Time   12/04/12  2:59 PM      Component Value Range Status Comment   C difficile by pcr POSITIVE (*) NEGATIVE Final      Studies: No results found.  Scheduled Meds:   . carvedilol  12.5 mg Oral BID  . metroNIDAZOLE  500 mg Oral TID  . potassium chloride  20  mEq Oral BID  . saccharomyces boulardii  250 mg Oral BID  . simvastatin  10 mg Oral Q M,W,F  . sodium chloride  3 mL Intravenous Q12H  . Tamsulosin HCl  0.4 mg Oral Daily  . vancomycin  125 mg Oral Q6H   Continuous Infusions:   . sodium chloride 100 mL/hr at 12/11/12 8657    Principal Problem:  *LGI bleed Active Problems:  HTN (hypertension)  GERD (gastroesophageal reflux disease)  Anemia  MGUS (monoclonal gammopathy of unknown significance)  C. difficile colitis  Dehydration  Orthostatic hypotension   [place] [facility] [phone] If 8PM-8AM, please contact night-coverage at [email or web address], password [facility] 12/11/2012, 1:57 PM  LOS: 1 day

## 2012-12-12 DIAGNOSIS — A0472 Enterocolitis due to Clostridium difficile, not specified as recurrent: Secondary | ICD-10-CM

## 2012-12-12 LAB — BASIC METABOLIC PANEL
BUN: 8 mg/dL (ref 6–23)
GFR calc Af Amer: 85 mL/min — ABNORMAL LOW (ref >90)
GFR calc non Af Amer: 73 mL/min — ABNORMAL LOW (ref >90)
Potassium: 3.7 mEq/L (ref 3.5–5.1)

## 2012-12-12 LAB — CBC
HCT: 25.3 % — ABNORMAL LOW (ref 39.0–52.0)
MCHC: 35.2 g/dL (ref 30.0–36.0)
Platelets: 130 10*3/uL — ABNORMAL LOW (ref 150–400)
RDW: 15.6 % — ABNORMAL HIGH (ref 11.5–15.5)

## 2012-12-12 MED ORDER — TECHNETIUM TC 99M-LABELED RED BLOOD CELLS IV KIT: 30.0000 | PACK | Freq: Once | INTRAVENOUS | Status: AC | PRN

## 2012-12-12 NOTE — Progress Notes (Signed)
-   I have review the data and agree with above. - Bp stable restart Bp medications. - if no further bleeding home in am. - patient is frustrated by the situation. I have explained to him that this is self limited. Still relates he need to take care of his wife at home and cannot be coming back to the hospital.

## 2012-12-12 NOTE — Progress Notes (Signed)
[facility] PROGRESS NOTE  Frank Kramer ZOX:[id number] DOB: Nov 30, 1921 DOA: 12/10/2012 PCP: Darrow Bussing, MD  Assessment/Plan  Acute lower GI bleeding/Chronic Anemia: - Hbg fluctuating 9.0-8.0. - had a  4 bloody BM overnight, last one about 1/2 hr ago. - tagged RBC nuclear scan.    Orthostatic hypotension - Resolved. - BP now stable.  Will continue to hold amlodipine and lisinopril for now.    Dehydration - Resolved.    -Secondary to poor oral intake. Brief IV fluids.  C. difficile colitis - Two loose BMs overnight - tinged maroon color. - Was discharged on 2/4 with 2 more weeks of PO Vanc and Flagyl.  These have been continued this admission.   Hypertension - Continue carvedilol to avoid rebound tachycardia. Hold amlodipine and lisinopril.  GERD:   -PPI's were discontinued on previous admission due to C. difficile colitis.  Mylanta PRN  Anxiety:   -Continue prn benzodiazepines.   Ethics:  Patient elderly with significant co-morbidities with wife 53 (lung cancer).  Family may benefit from Hospice Consultation if Hospice is not involved yet.  I will check with Palliative to see if Hospice is involved with the family.  Code Status: full Family Communication:  Disposition Plan: to home when appropriate.  (likely 12/12/12)   Consultants:  Gastroenterology  HPI/Subjective: Patient anxious about his wife.   - had 4 bloody BM overnight, last one about half-hour ago  Objective: Filed Vitals:   12/11/12 2113 12/12/12 0603 12/12/12 0604 12/12/12 0605  BP: 123/81 136/68 100/76 140/70  Pulse: 89 97 90 90  Temp: 97 F (36.1 C) 98.1 F (36.7 C)    TempSrc: Oral Oral    Resp: 18 18    Height:      Weight:      SpO2: 98% 98%      Intake/Output Summary (Last 24 hours) at 12/12/12 0816 Last data filed at 12/11/12 1900  Gross per 24 hour  Intake    720 ml  Output    200 ml  Net    520 ml   Filed Weights   12/10/12 1900 12/11/12 0430  Weight: 59.78 kg (131 lb  12.7 oz) 59.1 kg (130 lb 4.7 oz)    Exam:   General:  Well developed, thin, pale, elderly male.  A&O, NAD, Sitting up in bed.  Cardiovascular: RRR with systolic male, no lower extremity edema  Respiratory: no w/c/r, no accessory muscle use   Abdomen: Soft, nt, nd, +BS, no masses  Extremities:  No c/c/e, able to move all four.  Data Reviewed: Basic Metabolic Panel:  Recent Labs Lab 12/07/12 0455 12/08/12 0435 12/10/12 1121 12/11/12 0640 12/12/12 0655  NA 138  --  142 140 141  K 3.4* 3.6 3.8 3.4* 3.7  CL 107  --  107 107 109  CO2 24  --  28 24 23   GLUCOSE 106*  --  103* 99 99  BUN 6  --  10 6 8   CREATININE 0.94  --  1.05 0.87 0.92  CALCIUM 8.2*  --  9.3 8.6 8.8   Liver Function Tests:  Recent Labs Lab 12/10/12 1121  AST 26  ALT 29  ALKPHOS 43  BILITOT 0.4  PROT 6.3  ALBUMIN 3.6   CBC:  Recent Labs Lab 12/07/12 0455  12/10/12 1121 12/10/12 1652 12/10/12 2319 12/11/12 0640 12/12/12 0655  WBC 3.3*  --  4.6 4.4 4.3 4.5 4.3  NEUTROABS 1.9  --   --   --   --   --   --  HGB 8.9*  < > 9.6* 9.3* 8.3* 9.1* 8.9*  HCT 24.8*  < > 26.7* 25.7* 23.3* 26.4* 25.3*  MCV 95.8  --  96.0 96.6 96.7 97.4 97.7  PLT 91*  --  122* 120* 123* 130* 130*  < > = values in this interval not displayed.   Recent Results (from the past 240 hour(s))  CLOSTRIDIUM DIFFICILE BY PCR     Status: Abnormal   Collection Time    12/04/12  2:59 PM      Result Value Range Status   C difficile by pcr POSITIVE (*) NEGATIVE Final   Comment: CRITICAL RESULT CALLED TO, READ BACK BY AND VERIFIED WITH:     Chancy Milroy RN [id number] 1/34/14 A BROWNING     Studies: No results found.  Scheduled Meds: . carvedilol  12.5 mg Oral BID  . metroNIDAZOLE  500 mg Oral TID  . potassium chloride  20 mEq Oral BID  . saccharomyces boulardii  250 mg Oral BID  . simvastatin  10 mg Oral Q M,W,F  . sodium chloride  3 mL Intravenous Q12H  . Tamsulosin HCl  0.4 mg Oral Daily  . vancomycin  125 mg Oral Q6H    Continuous Infusions:   Principal Problem:   LGI bleed Active Problems:   HTN (hypertension)   GERD (gastroesophageal reflux disease)   Anemia   MGUS (monoclonal gammopathy of unknown significance)   C. difficile colitis   Dehydration   Orthostatic hypotension   Marinda Elk, PA-C [facility] [phone] If 8PM-8AM, please contact night-coverage at [email or web address], password [facility] 12/12/2012, 8:16 AM  LOS: 2 days

## 2012-12-12 NOTE — Progress Notes (Addendum)
[facility] Progress Note  Subjective: Patient resumed having some bright red blood per rectum this morning. Hemoglobin stable. Pooled RBC scan has been ordered.  Objective: Vital signs in last 24 hours: Temp:  [97 F (36.1 C)-98.1 F (36.7 C)] 98.1 F (36.7 C) (02/08 0603) Pulse Rate:  [83-97] 90 (02/08 0605) Resp:  [18] 18 (02/08 0603) BP: (100-152)/(56-81) 140/70 mmHg (02/08 0605) SpO2:  [96 %-98 %] 98 % (02/08 0603) Weight change:    PE: Abdomen soft nondistended with normoactive bowel sounds. Lab Results: Results for orders placed during the hospital encounter of 12/10/12 (from the past 24 hour(s))  CBC     Status: Abnormal   Collection Time    12/12/12  6:55 AM      Result Value Range   WBC 4.3  4.0 - 10.5 K/uL   RBC 2.59 (*) 4.22 - 5.81 MIL/uL   Hemoglobin 8.9 (*) 13.0 - 17.0 g/dL   HCT 16.1 (*) 09.6 - 04.5 %   MCV 97.7  78.0 - 100.0 fL   MCH 34.4 (*) 26.0 - 34.0 pg   MCHC 35.2  30.0 - 36.0 g/dL   RDW 40.9 (*) 81.1 - 91.4 %   Platelets 130 (*) 150 - 400 K/uL  BASIC METABOLIC PANEL     Status: Abnormal   Collection Time    12/12/12  6:55 AM      Result Value Range   Sodium 141  135 - 145 mEq/L   Potassium 3.7  3.5 - 5.1 mEq/L   Chloride 109  96 - 112 mEq/L   CO2 23  19 - 32 mEq/L   Glucose, Bld 99  70 - 99 mg/dL   BUN 8  6 - 23 mg/dL   Creatinine, Ser 7.82  0.50 - 1.35 mg/dL   Calcium 8.8  8.4 - 95.6 mg/dL   GFR calc non Af Amer 73 (*) >90 mL/min   GFR calc Af Amer 85 (*) >90 mL/min    Studies/Results: No results found.    Assessment: Likely lower GI bleed but history of positive pooled RBC scan a year ago showing bleeding from the jejunum.  Plan: Agree with repeat pooled RBC scan. Unless small bowel source suggested we'll probably need to go ahead with colonoscopy.    Frank Kramer,Trebor C 12/12/2012, 12:11 PM

## 2012-12-12 NOTE — Progress Notes (Signed)
Pt is anxiety and asked for something to help calm him so that he can sleep. Pt given PRN xanax.

## 2012-12-13 ENCOUNTER — Inpatient Hospital Stay (HOSPITAL_COMMUNITY): Payer: Medicare Other

## 2012-12-13 DIAGNOSIS — K625 Hemorrhage of anus and rectum: Secondary | ICD-10-CM

## 2012-12-13 DIAGNOSIS — I10 Essential (primary) hypertension: Secondary | ICD-10-CM

## 2012-12-13 LAB — CBC
Hemoglobin: 8.8 g/dL — ABNORMAL LOW (ref 13.0–17.0)
MCHC: 35.1 g/dL (ref 30.0–36.0)
RDW: 16 % — ABNORMAL HIGH (ref 11.5–15.5)
WBC: 4.3 10*3/uL (ref 4.0–10.5)

## 2012-12-13 MED ORDER — TECHNETIUM TC 99M-LABELED RED BLOOD CELLS IV KIT
20.0000 | PACK | Freq: Once | INTRAVENOUS | Status: AC | PRN
Start: 2012-12-13 — End: 2012-12-13
  Administered 2012-12-13: 23.5 via INTRAVENOUS

## 2012-12-13 MED ORDER — LORAZEPAM 0.5 MG PO TABS
0.5000 mg | ORAL_TABLET | Freq: Three times a day (TID) | ORAL | Status: DC | PRN
  Administered 2012-12-13: 0.5 mg via ORAL
  Filled 2012-12-13: qty 1

## 2012-12-13 NOTE — Progress Notes (Signed)
Patient was told by family his wife had passed away at home earlier today. Patient became very upset. Chaplin called to see family. Patient now requesting to take out IV. Patient, even though upset and not thinking about his own health, has requested for me to remove his IV. Will remove IV per patient request.

## 2012-12-13 NOTE — Progress Notes (Signed)
Chaplain responded to nurse's request to support pt's daughters as they inform pt that his wife has just died. Pt had just been informed as I arrived and was in great distress. I joined the 3 daughters at bedside and offered emotional and spiritual support to pt and also to daughters. Pt and wife had been together 69 years and pt was questioning what he would do without her. Pt's wife's home health caregiver is herself at [facility] as a pt and as a result all three daughters were with their mom this morning at home when she expired unexpectedly. They took great comfort in knowing that they had all been present with her and saw that as God's perfect timing. Pt expressed desire to leave hospital today to be with his wife. Daughters told pt that he must stay at hospital for 24 hours following the procedure he had today to ensure there are no complications. Daughters also told pt that his wife was "not ready yet" at the funeral home but firmly promised to take him there tomorrow.

## 2012-12-13 NOTE — Progress Notes (Addendum)
[facility] PROGRESS NOTE  Frank Kramer QMV:[id number] DOB: 24-Feb-1922 DOA: 12/10/2012 PCP: Darrow Bussing, MD  Assessment/Plan  Acute lower GI bleeding/Chronic Anemia: - Hbg fluctuating 9.0-8.0.- tagged RBC nuclear scan pending, reorder. - had a  4 bloody BM overnight, last one about 1/2 hr ago.  Dehydration - Resolved.    -Secondary to poor oral intake. Brief IV fluids.  C. difficile colitis - Two loose BMs overnight - tinged maroon color. - Was discharged on 2/4 with 2 more weeks of PO Vanc and Flagyl.  These have been continued this admission.  Hypertension - Continue carvedilol to avoid rebound tachycardia. Hold amlodipine and lisinopril.  GERD:   -PPI's were discontinued on previous admission due to C. difficile colitis.  Mylanta PRN  Anxiety:   -Continue prn benzodiazepines.   Ethics:  Patient elderly with significant co-morbidities with wife 57 (lung cancer).  Family may benefit from Hospice Consultation if Hospice is not involved yet.  I will check with Palliative to see if Hospice is involved with the family.  Code Status: full Family Communication:  Disposition Plan: to home when appropriate.  (likely 12/12/12)   Consultants:  Gastroenterology  HPI/Subjective: No further bleeding.  Objective: Filed Vitals:   12/12/12 2215 12/13/12 0616 12/13/12 0618 12/13/12 0620  BP: 139/60 153/67 109/57 104/60  Pulse: 89 93 99 102  Temp: 98.8 F (37.1 C) 97.9 F (36.6 C)    TempSrc: Oral Oral    Resp: 18 18    Height:      Weight:      SpO2: 94% 96%      Intake/Output Summary (Last 24 hours) at 12/13/12 1051 Last data filed at 12/13/12 0900  Gross per 24 hour  Intake    680 ml  Output    300 ml  Net    380 ml   Filed Weights   12/10/12 1900 12/11/12 0430  Weight: 59.78 kg (131 lb 12.7 oz) 59.1 kg (130 lb 4.7 oz)    Exam:   General:  Well developed, thin, pale, elderly male.  A&O, NAD, Sitting up in bed.  Cardiovascular: RRR with systolic male, no  lower extremity edema  Respiratory: no w/c/r, no accessory muscle use   Abdomen: Soft, nt, nd, +BS, no masses  Extremities:  No c/c/e, able to move all four.  Data Reviewed: Basic Metabolic Panel:  Recent Labs Lab 12/07/12 0455 12/08/12 0435 12/10/12 1121 12/11/12 0640 12/12/12 0655  NA 138  --  142 140 141  K 3.4* 3.6 3.8 3.4* 3.7  CL 107  --  107 107 109  CO2 24  --  28 24 23   GLUCOSE 106*  --  103* 99 99  BUN 6  --  10 6 8   CREATININE 0.94  --  1.05 0.87 0.92  CALCIUM 8.2*  --  9.3 8.6 8.8   Liver Function Tests:  Recent Labs Lab 12/10/12 1121  AST 26  ALT 29  ALKPHOS 43  BILITOT 0.4  PROT 6.3  ALBUMIN 3.6   CBC:  Recent Labs Lab 12/07/12 0455  12/10/12 1652 12/10/12 2319 12/11/12 0640 12/12/12 0655 12/13/12 0807  WBC 3.3*  < > 4.4 4.3 4.5 4.3 4.3  NEUTROABS 1.9  --   --   --   --   --   --   HGB 8.9*  < > 9.3* 8.3* 9.1* 8.9* 8.8*  HCT 24.8*  < > 25.7* 23.3* 26.4* 25.3* 25.1*  MCV 95.8  < > 96.6 96.7 97.4 97.7  98.4  PLT 91*  < > 120* 123* 130* 130* 141*  < > = values in this interval not displayed.   Recent Results (from the past 240 hour(s))  CLOSTRIDIUM DIFFICILE BY PCR     Status: Abnormal   Collection Time    12/04/12  2:59 PM      Result Value Range Status   C difficile by pcr POSITIVE (*) NEGATIVE Final   Comment: CRITICAL RESULT CALLED TO, READ BACK BY AND VERIFIED WITH:     Chancy Milroy RN [id number] 1/34/14 A BROWNING     Studies: No results found.  Scheduled Meds: . carvedilol  12.5 mg Oral BID  . metroNIDAZOLE  500 mg Oral TID  . saccharomyces boulardii  250 mg Oral BID  . simvastatin  10 mg Oral Q M,W,F  . sodium chloride  3 mL Intravenous Q12H  . Tamsulosin HCl  0.4 mg Oral Daily  . vancomycin  125 mg Oral Q6H   Continuous Infusions:   Principal Problem:   LGI bleed Active Problems:   HTN (hypertension)   GERD (gastroesophageal reflux disease)   Anemia   MGUS (monoclonal gammopathy of unknown significance)   C. difficile  colitis   Dehydration   Orthostatic hypotension   Marinda Elk, PA-C [facility] [phone] If 8PM-8AM, please contact night-coverage at [email or web address], password [facility] 12/13/2012, 10:51 AM  LOS: 3 days

## 2012-12-13 NOTE — Progress Notes (Addendum)
Awaiting pooed RBC scan, from looking at active orders I'm not sure that this was ordered but the result is not available yet.Hb stable at 8.8

## 2012-12-13 NOTE — Progress Notes (Signed)
Addendum_noted that pt's wife passed away. Also pooled RBC scan weakly pos in LLQ. Very sad situation. Will see in AM and consider flex sig/colon

## 2012-12-14 LAB — CBC
Hemoglobin: 8.9 g/dL — ABNORMAL LOW (ref 13.0–17.0)
MCH: 34.5 pg — ABNORMAL HIGH (ref 26.0–34.0)
MCHC: 35.2 g/dL (ref 30.0–36.0)
MCV: 98.9 fL (ref 78.0–100.0)
MCV: 98.1 fL (ref 78.0–100.0)
Platelets: 143 10*3/uL — ABNORMAL LOW (ref 150–400)
Platelets: 137 10*3/uL — ABNORMAL LOW (ref 150–400)
RBC: 2.64 MIL/uL — ABNORMAL LOW (ref 4.22–5.81)
WBC: 4.4 10*3/uL (ref 4.0–10.5)

## 2012-12-14 MED ORDER — SODIUM CHLORIDE 0.9 % IV SOLN: INTRAVENOUS | Status: DC

## 2012-12-14 MED ORDER — SODIUM CHLORIDE 0.9 % IV SOLN
INTRAVENOUS | Status: DC
  Administered 2012-12-14: 12:00:00 via INTRAVENOUS

## 2012-12-14 MED ORDER — PEG 3350-KCL-NA BICARB-NACL 420 G PO SOLR
4000.0000 mL | Freq: Once | ORAL | Status: AC
  Administered 2012-12-14: 4000 mL via ORAL
  Filled 2012-12-14: qty 4000

## 2012-12-14 NOTE — Progress Notes (Signed)
[facility] PROGRESS NOTE  Frank Kramer UJW:[id number] DOB: [birth year] DOA: 12/10/2012 PCP: Darrow Bussing, MD  Assessment/Plan  Acute lower GI bleeding/Chronic Anemia: - Hbg fluctuating but pressently appears stable. 9.0-8.0 - tagged RBC nuclear scan faintly positive. - had 0 bloody BM overnight, per his report. - Scheduled for Colonoscopy tomorrow with Dr. Madilyn Fireman of [facility].  Dehydration - Resolved.    -Secondary to poor oral intake. Brief IV fluids.  C. difficile colitis - stooling has decreased significantly.  Only 1 stool documented 12/17/22 - Was discharged on 2/4 with 2 more weeks of PO Vanc and Flagyl.  These have been    continued this admission.  Hypertension - vacillates high to low normal. - Continue carvedilol to avoid rebound tachycardia. Hold amlodipine and lisinopril.  GERD:   -PPI's were discontinued on previous admission due to C. difficile colitis.  Mylanta PRN  Anxiety:   -Continue prn benzodiazepines.   Social:  Frank Kramer wife passed away 12-17-2022.  This has been very difficult for him.  He initially wanted to leave the hospital despite continued slow GI bleeding.  After reconsideration, he has decided to stay for colonoscopy.  He is hopeful for discharge tomorrow after colonoscopy.   Frank Kramer would likely benefit from a Hospice consult at the appropriate time given his co-morbidities and recent passing of his wife.  Code Status: full Family Communication:  Disposition Plan: to home when appropriate.  (likely 12/12/12)   Consultants:  Gastroenterology  HPI/Subjective: No further bleeding.  Objective: Filed Vitals:   2012/12/17 2034 12/14/12 0518 12/14/12 0521 12/14/12 0523  BP: 138/67 126/66 104/63 97/62  Pulse: 93 93 112 118  Temp: 98.5 F (36.9 C) 98.9 F (37.2 C)    TempSrc: Oral Oral    Resp: 18 18    Height:      Weight:      SpO2: 94% 97%      Intake/Output Summary (Last 24 hours) at 12/14/12 0941 Last data filed at 12/17/12 1900  Gross  per 24 hour  Intake    120 ml  Output    200 ml  Net    -80 ml   Filed Weights   12/10/12 1900 12/11/12 0430  Weight: 59.78 kg (131 lb 12.7 oz) 59.1 kg (130 lb 4.7 oz)    Exam:   General:  Well developed, thin, pale, elderly male.  A&O, NAD, Sitting up in bed.  Cardiovascular: RRR with systolic murmur, no lower extremity edema  Respiratory: Crackles on the right that clear with cough, no accessory muscle use   Abdomen: Soft, nt, nd, +BS, no masses  Extremities:  No c/c/e, able to move all four.  Data Reviewed: Basic Metabolic Panel:  Recent Labs Lab 12/08/12 0435 12/10/12 1121 12/11/12 0640 12/12/12 0655  NA  --  142 140 141  K 3.6 3.8 3.4* 3.7  CL  --  107 107 109  CO2  --  28 24 23   GLUCOSE  --  103* 99 99  BUN  --  10 6 8   CREATININE  --  1.05 0.87 0.92  CALCIUM  --  9.3 8.6 8.8   Liver Function Tests:  Recent Labs Lab 12/10/12 1121  AST 26  ALT 29  ALKPHOS 43  BILITOT 0.4  PROT 6.3  ALBUMIN 3.6   CBC:  Recent Labs Lab 12/10/12 2319 12/11/12 0640 12/12/12 0655 Dec 17, 2012 0807 12/14/12 0555  WBC 4.3 4.5 4.3 4.3 4.4  HGB 8.3* 9.1* 8.9* 8.8* 9.1*  HCT 23.3*  26.4* 25.3* 25.1* 26.1*  MCV 96.7 97.4 97.7 98.4 98.9  PLT 123* 130* 130* 141* 143*     Recent Results (from the past 240 hour(s))  CLOSTRIDIUM DIFFICILE BY PCR     Status: Abnormal   Collection Time    12/04/12  2:59 PM      Result Value Range Status   C difficile by pcr POSITIVE (*) NEGATIVE Final   Comment: CRITICAL RESULT CALLED TO, READ BACK BY AND VERIFIED WITH:     Chancy Milroy RN [id number] 1/34/14 A BROWNING     Studies: Nm Gi Blood Loss  12/13/2012  *RADIOLOGY REPORT*  Clinical Data: Lower gastrointestinal bleeding  NUCLEAR MEDICINE GASTROINTESTINAL BLEEDING STUDY  Technique:  Sequential abdominal images were obtained following intravenous administration of Tc-80m labeled red blood cells.  Radiopharmaceutical: 23.5MILLI CURIE ULTRATAG TECHNETIUM TC 21M- LABELED RED BLOOD CELLS IV KIT   Comparison: CT scan 12/04/2012, nuclear medicine GI bleeding scan 11/21/2011  Findings: There is no evidence of active gastrointestinal bleeding during the first 60 minutes of the tagged red blood cell scan. During the second 60 minutes, a focus of activity begins to accumulate within the left lower quadrant from approximately 95 minutes to 120 minutes.  This is subtle activity.  This is in the vicinity of the positive activity on comparison scan from 11/21/2011.  In reviewing the scan from 11/20/2012, I believe this activity is in the same location and the sources is the distal descending colon/proximal sigmoid colon.  IMPRESSION:  Small focus of active gastrointestinal bleeding occurs late in the scan in the left lower quadrant.  Legrand Rams this to be within the proximal sigmoid colon. Bleeding much less brisk than  comparison tagged red blood cell scan 01/17 1013.  Findings discussed with Dr. Radonna Ricker Mena Goes with these on 12/13/2012 at 1645 hours   Original Report Authenticated By: Genevive Bi, M.D.     Scheduled Meds: . carvedilol  12.5 mg Oral BID  . metroNIDAZOLE  500 mg Oral TID  . saccharomyces boulardii  250 mg Oral BID  . simvastatin  10 mg Oral Q M,W,F  . sodium chloride  3 mL Intravenous Q12H  . Tamsulosin HCl  0.4 mg Oral Daily  . vancomycin  125 mg Oral Q6H   Continuous Infusions:   Principal Problem:   LGI bleed Active Problems:   HTN (hypertension)   GERD (gastroesophageal reflux disease)   Anemia   MGUS (monoclonal gammopathy of unknown significance)   C. difficile colitis   Dehydration   Orthostatic hypotension   Frank Kramer [facility] [phone] If 8PM-8AM, please contact night-coverage at [email or web address], password [facility] 12/14/2012, 9:41 AM  LOS: 4 days

## 2012-12-14 NOTE — Progress Notes (Signed)
Started Go-lytly at 1740. Pt understands to drink slowly over 4 hours.

## 2012-12-14 NOTE — Progress Notes (Signed)
Physical Therapy Treatment Patient Details Name: Frank Kramer MRN: [id number] DOB: [birth year] Today's Date: 12/14/2012 Time: 4098-1191 PT Time Calculation (min): 27 min  PT Assessment / Plan / Recommendation Comments on Treatment Session  Pt adm with GIB.  Pt not quite as strong today and grieving over wife.  Will need some family support at home.    Follow Up Recommendations  Home health PT     Does the patient have the potential to tolerate intense rehabilitation     Barriers to Discharge        Equipment Recommendations  None recommended by PT    Recommendations for Other Services    Frequency Min 3X/week   Plan Discharge plan needs to be updated;Frequency remains appropriate    Precautions / Restrictions Precautions Precautions: Fall   Pertinent Vitals/Pain No c/o's    Mobility  Bed Mobility Bed Mobility: Sit to Supine Sit to Supine: 6: Modified independent (Device/Increase time);HOB elevated Details for Bed Mobility Assistance: Incr time Transfers Sit to Stand: 6: Modified independent (Device/Increase time);With upper extremity assist;From bed Stand to Sit: 6: Modified independent (Device/Increase time);With upper extremity assist;To bed Ambulation/Gait Ambulation/Gait Assistance: 4: Min guard Ambulation Distance (Feet): 200 Feet Assistive device: Rolling walker Gait Pattern: Step-through pattern;Decreased stride length;Trunk flexed Gait velocity: decr    Exercises     PT Diagnosis:    PT Problem List:   PT Treatment Interventions:     PT Goals Acute Rehab PT Goals PT Goal: Ambulate - Progress: Not progressing  Visit Information  Last PT Received On: 12/14/12 Assistance Needed: +1    Subjective Data  Subjective: "My wife passed away."   Cognition  Cognition Overall Cognitive Status: Appears within functional limits for tasks assessed/performed Arousal/Alertness: Awake/alert Orientation Level: Appears intact for tasks assessed Behavior During  Session: [facility] for tasks performed    Balance     End of Session PT - End of Session Activity Tolerance: Patient tolerated treatment well Patient left: in bed;with call bell/phone within reach Nurse Communication: Mobility status   GP     [facility] 12/14/2012, 2:55 PM  [facility] PT [phone]

## 2012-12-14 NOTE — Progress Notes (Signed)
Reviewed current situation with primary care team. The patient has agreed to stay in the hospital for colonoscopy tomorrow after discussion with his daughters. This has been scheduled for 9 AM.

## 2012-12-15 ENCOUNTER — Encounter (HOSPITAL_COMMUNITY): Payer: Self-pay | Admitting: Gastroenterology

## 2012-12-15 ENCOUNTER — Encounter (HOSPITAL_COMMUNITY)
Admission: EM | Disposition: A | Discharge status: Home / Self Care | Payer: Self-pay | Source: Home / Self Care | Attending: Internal Medicine

## 2012-12-15 DIAGNOSIS — K219 Gastro-esophageal reflux disease without esophagitis: Secondary | ICD-10-CM

## 2012-12-15 DIAGNOSIS — D62 Acute posthemorrhagic anemia: Secondary | ICD-10-CM

## 2012-12-15 HISTORY — PX: COLONOSCOPY: SHX5424

## 2012-12-15 LAB — BASIC METABOLIC PANEL
Chloride: 107 mEq/L (ref 96–112)
Creatinine, Ser: 1 mg/dL (ref 0.50–1.35)
GFR calc Af Amer: 75 mL/min — ABNORMAL LOW (ref >90)
GFR calc non Af Amer: 65 mL/min — ABNORMAL LOW (ref >90)

## 2012-12-15 LAB — CBC
MCHC: 34.8 g/dL (ref 30.0–36.0)
MCV: 97.7 fL (ref 78.0–100.0)
Platelets: 158 10*3/uL (ref 150–400)
RDW: 16 % — ABNORMAL HIGH (ref 11.5–15.5)
WBC: 5.2 10*3/uL (ref 4.0–10.5)

## 2012-12-15 SURGERY — COLONOSCOPY
Anesthesia: Moderate Sedation

## 2012-12-15 MED ORDER — FENTANYL CITRATE 0.05 MG/ML IJ SOLN
INTRAMUSCULAR | Status: DC | PRN
  Administered 2012-12-15 (×2): 25 ug via INTRAVENOUS

## 2012-12-15 MED ORDER — ENALAPRIL MALEATE 20 MG PO TABS: 20.0000 mg | ORAL_TABLET | Freq: Two times a day (BID) | ORAL | Status: DC

## 2012-12-15 MED ORDER — HYDROCODONE-ACETAMINOPHEN 5-325 MG PO TABS: 1.0000 | ORAL_TABLET | Freq: Four times a day (QID) | ORAL | Status: DC | PRN

## 2012-12-15 MED ORDER — CARVEDILOL 12.5 MG PO TABS
12.5000 mg | ORAL_TABLET | Freq: Two times a day (BID) | ORAL | Status: DC
  Filled 2012-12-15 (×3): qty 1

## 2012-12-15 MED ORDER — METRONIDAZOLE 500 MG PO TABS: 500.0000 mg | ORAL_TABLET | Freq: Three times a day (TID) | ORAL | Status: DC

## 2012-12-15 MED ORDER — ALPRAZOLAM 0.5 MG PO TABS: 0.5000 mg | ORAL_TABLET | Freq: Two times a day (BID) | ORAL | Status: DC | PRN

## 2012-12-15 MED ORDER — SACCHAROMYCES BOULARDII 250 MG PO CAPS: 250.0000 mg | ORAL_CAPSULE | Freq: Two times a day (BID) | ORAL | Status: DC

## 2012-12-15 MED ORDER — FENTANYL CITRATE 0.05 MG/ML IJ SOLN
INTRAMUSCULAR | Status: AC
  Filled 2012-12-15: qty 2

## 2012-12-15 MED ORDER — CALCIUM POLYCARBOPHIL 625 MG PO TABS: 625.0000 mg | ORAL_TABLET | Freq: Every day | ORAL | Status: DC

## 2012-12-15 MED ORDER — SODIUM CHLORIDE 0.9 % IV BOLUS (SEPSIS): 500.0000 mL | Freq: Once | INTRAVENOUS | Status: DC

## 2012-12-15 MED ORDER — MIDAZOLAM HCL 5 MG/5ML IJ SOLN
INTRAMUSCULAR | Status: DC | PRN
  Administered 2012-12-15 (×2): 2 mg via INTRAVENOUS

## 2012-12-15 MED ORDER — AMLODIPINE BESYLATE 10 MG PO TABS: 10.0000 mg | ORAL_TABLET | Freq: Every day | ORAL | Status: DC

## 2012-12-15 MED ORDER — VANCOMYCIN 50 MG/ML ORAL SOLUTION: 125.0000 mg | Freq: Four times a day (QID) | ORAL | Status: DC

## 2012-12-15 MED ORDER — MIDAZOLAM HCL 5 MG/ML IJ SOLN
INTRAMUSCULAR | Status: AC
  Filled 2012-12-15: qty 2

## 2012-12-15 NOTE — Care Management Note (Signed)
    Page 1 of 2   12/15/2012     1:13:38 PM   CARE MANAGEMENT NOTE 12/15/2012  Patient:  Frank Kramer, Frank Kramer   Account Number:  [id number]  Date Initiated:  12/15/2012  Documentation initiated by:  Letha Cape  Subjective/Objective Assessment:   dx gib  admit     Action/Plan:   pt eval- rec hhpt   Anticipated DC Date:  12/15/2012   Anticipated DC Plan:  HOME W HOME HEALTH SERVICES      DC Planning Services  CM consult      [facility] Choice  HOME HEALTH   Choice offered to / List presented to:  C-1 Patient        HH arranged  HH-1 RN  HH-2 PT      [facility] agency  [facility]   Status of service:  Completed, signed off Medicare Important Message given?   (If response is "NO", the following Medicare IM given date fields will be blank) Date Medicare IM given:   Date Additional Medicare IM given:    Discharge Disposition:  HOME W HOME HEALTH SERVICES  Per UR Regulation:  Reviewed for med. necessity/level of care/duration of stay  If discussed at Long Length of Stay Meetings, dates discussed:    Comments:  12/15/12 13:10 Letha Cape RN, BSN [phone] patient for dc today, patient is active with [facility] for [facility] and PT, patient has an aid that comes Mon- Fri from 9:30 - 11:30.  Patient states that he would like to continue with [facility],  they were notified that patient is being dc today. Soc will begin 24-48 hrs post discharge.

## 2012-12-15 NOTE — Discharge Summary (Signed)
-   unfortunate situation as the pt wife passed way while he was in the hospital. - I have reviewed the data and agree with above. Colonoscopy showed no obvious sign of bleeding. Hbg remained stable.

## 2012-12-15 NOTE — Discharge Summary (Signed)
Physician Discharge Summary  [name]:[id number] DOB: 1922-06-03 DOA: 12/10/2012  PCP: Darrow Bussing, MD  Admit date: 12/10/2012 Discharge date: 12/15/2012  Time spent: 45  minutes  Recommendations for Outpatient Follow-up:   CBC / Bmet / BP Check in 4 - 7 days.  Patient hospitalized with GI Bleeding and recent C-diff diarrhea.   He has acute blood loss anemia and is on iron.  Please ensure hgb is stable (8.9 on discharge).  BP has vacillated in the hospital, swinging from high to ortho-static and low.  Amlodipine and vasotech will be held until seen by PCP who can restart them at the appropriate time.  Coreg will be continued.  He will be discharge to home with home health Physical Therapy and Aide.  Discharge Diagnoses:  Principal Problem:   LGI bleed Active Problems:   HTN (hypertension)   GERD (gastroesophageal reflux disease)   Anemia   MGUS (monoclonal gammopathy of unknown significance)   C. difficile colitis   Dehydration   Orthostatic hypotension  Discharge Condition: stable, depressed.  Diet recommendation: regular diet as tolerated.  Filed Weights   12/10/12 1900 12/11/12 0430 12/15/12 0837  Weight: 59.78 kg (131 lb 12.7 oz) 59.1 kg (130 lb 4.7 oz) 60.328 kg (133 lb)    History of present illness:  Frank Kramer is a 79 y.o. male was recently discharged from hospital on 12/08/12 after evaluation and treatment for C. difficile colitis associated with rectal bleeding. Gastroenterology has consulted and no procedures were performed. Diarrhea had decreased. Patient indicated that he had 2 BMs yesterday. However since 4 AM on 2/6, he started having multiple episodes of rectal bleeding. He says he's had at least 12 episodes thus far. BM definitely has bright red blood and unknown amount of stool. He complains that his abdomen is sore. No nausea, vomiting, fever or chills. He has a good appetite but has not eaten because of rectal bleeding. Feels generally weak but denies  dizziness, lightheadedness or fall with ambulation. He presented to the emergency department where he was orthostatic but hemoglobin was stable compared to recent discharge. Colonoscopy 2008 showed sigmoid colon diverticula. Hospitalist service is requested to admit for further evaluation and management. [facility] have been consulted by EDP. Patient does have PMH of HTN, MGUS, dyslipidemia, anxiety and GERD   Hospital Course:   Acute lower GI bleeding/Chronic Anemia:  Frank Kramer was admitted and continued to have BRBPR.  [facility] was reconsulted.  Initially he was managed conservatively, but he continued to bleed.  A nuclear medicine bleed scan was faintly positive and GI felt it was appropriate to move forward with colonoscopy. Over the 48 hours prior to discharge Mr. Minerva had no further bleeding.   Colonscopy was clean with only diverticula no signs of bleeding.  Hbg is presently stable at approximately 9.0.  He has not received any blood transfusions this admission.  Dehydration   Resolved. Secondary to poor oral intake. Brief IV fluids.   C. difficile colitis  Stooling has decreased significantly. He was discharged on 2/4 with 2 more weeks of PO Vanc and Flagyl. These have been continued this admission and will finish on 2/18.  Hypertension  Vacillates high to low normal.  Continue carvedilol to avoid rebound tachycardia. Hold amlodipine and lisinopril until seen by his primary care physician and rechecked.  GERD:  PPI's were discontinued on previous admission due to C. difficile colitis. Mylanta PRN   Anxiety Continue prn benzodiazepines as he was on at home.  Social [name]'s wife passed away 12/17/2022. This has been very difficult for him. He initially wanted to leave the hospital despite continued slow GI bleeding. After reconsideration, he decided to stay for colonoscopy.   He appears to have a supportive family.  He may benefit from a Hospice consultation outpatient given  his co-morbidities.  Procedures:  NM Bleeding Scan - Small focus of active gastrointestinal bleeding occurs late in the  scan in the left lower quadrant. Legrand Rams this to be within the  proximal sigmoid colon. Bleeding much less brisk than comparison  tagged red blood cell scan 01/17 1013.    Colonoscopy 12/15/12 - multiple diverticula in the sigmoid colon.  Otherwise normal.  Consultations:  Gastroenterology, Dr. Madilyn Fireman  Discharge Exam: Filed Vitals:   12/15/12 0940 12/15/12 0945 12/15/12 0950 12/15/12 0955  BP: 95/48 95/41 90/50  94/46  Pulse:      Temp:      TempSrc:      Resp: 79 59 94 31  Height:      Weight:      SpO2: 99% 98% 98% 95%    General: A&O, NAD, Sitting up in bed, appears strong.  Calm & pleasant, but sad over his wife passing. Cardiovascular: rrr, no m/r/g Respiratory: cta no w/c/r Abdomen:  Thin, soft, nt, nd, +bs, no masses  Discharge Instructions  Discharge Orders   Future Appointments Provider Department Dept Phone   12/18/2012 10:30 AM Dava Najjar [facility]elle [facility] [phone]   12/18/2012 11:00 AM Levert Feinstein, MD [facility] [phone]   Future Orders Complete By Expires     Diet - low sodium heart healthy  As directed     Increase activity slowly  As directed         Medication List    STOP taking these medications       HYDROcodone-acetaminophen 7.5-500 MG per tablet  Commonly known as:  LORTAB  Replaced by:  HYDROcodone-acetaminophen 5-325 MG per tablet      TAKE these medications       alendronate 70 MG tablet  Commonly known as:  FOSAMAX  Take 70 mg by mouth every 7 (seven) days. Take with a full glass of water on an empty stomach. Takes on Saturdays.     ALPRAZolam 0.5 MG tablet  Commonly known as:  XANAX  Take 1 tablet (0.5 mg total) by mouth 2 (two) times daily as needed. anxiety     amLODipine 10 MG tablet  Commonly known as:  NORVASC  Take 1 tablet (10  mg total) by mouth at bedtime. Do not restart this medication until after he has been seen by his primary care physician in 4 - 7 days.     butalbital-acetaminophen-caffeine 50-325-40 MG per tablet  Commonly known as:  FIORICET, ESGIC  Take 1 tablet by mouth 2 (two) times daily as needed. For headaches.     carvedilol 12.5 MG tablet  Commonly known as:  COREG  Take 12.5 mg by mouth 2 (two) times daily.     cholecalciferol 1000 UNITS tablet  Commonly known as:  VITAMIN D  Take 1,000 Units by mouth daily.     enalapril 20 MG tablet  Commonly known as:  VASOTEC  Take 1 tablet (20 mg total) by mouth 2 (two) times daily.     ferrous sulfate 325 (65 FE) MG tablet  Take 325 mg by mouth daily.     fluocinonide ointment 0.05 %  Commonly known as:  LIDEX  Apply 1 application topically 2 (two) times daily. rash     HYDROcodone-acetaminophen 5-325 MG per tablet  Commonly known as:  NORCO/VICODIN  Take 1 tablet by mouth every 6 (six) hours as needed. Use sparingly as it can cause a drop in blood pressure     metroNIDAZOLE 500 MG tablet  Commonly known as:  FLAGYL  Take 1 tablet (500 mg total) by mouth 3 (three) times daily. Continue thru December 22, 2012 then stop.     multivitamin with minerals Tabs  Take 1 tablet by mouth daily.     polycarbophil 625 MG tablet  Commonly known as:  FIBERCON  Take 1 tablet (625 mg total) by mouth daily. Do not restart for 1 week.     pravastatin 20 MG tablet  Commonly known as:  PRAVACHOL  Take 20 mg by mouth 3 (three) times a week. Takes on Mon, Wed, Fri.     saccharomyces boulardii 250 MG capsule  Commonly known as:  FLORASTOR  Take 1 capsule (250 mg total) by mouth 2 (two) times daily.     silodosin 8 MG Caps capsule  Commonly known as:  RAPAFLO  Take 8 mg by mouth daily with breakfast.     vancomycin 50 mg/mL oral solution  Commonly known as:  VANCOCIN  Take 2.5 mLs (125 mg total) by mouth every 6 (six) hours. Continue to take thru 2/18.    Then stop           Follow-up Information   Follow up with Darrow Bussing, MD. Schedule an appointment as soon as possible for a visit in 4 days. (4-7 days for hospital follow up.)    Contact information:   [address] [place] [address] [phone]        The results of significant diagnostics from this hospitalization (including imaging, microbiology, ancillary and laboratory) are listed below for reference.    Significant Diagnostic Studies: Ct Abdomen Pelvis Wo Contrast  12/04/2012  *RADIOLOGY REPORT*  Clinical Data: Rectal bleeding.  Non-Hodgkin's lymphoma.  CT ABDOMEN AND PELVIS WITHOUT CONTRAST  Technique:  Multidetector CT imaging of the abdomen and pelvis was performed following the standard protocol without intravenous contrast.  Comparison: CT scan dated 11/21/2011 and 07/05/2011  Findings: There is edema of the mucosa of the cecum and proximal ascending colon.  There is also diffuse edema of the mucosa of the descending colon extending into the proximal sigmoid portion of the colon.  This is consistent with colitis.  In this patient in this distribution, ischemic colitis should be considered.  The transverse colon appears normal.  There are numerous diverticula throughout the colon particularly in the sigmoid region.  There are multiple benign-appearing renal and hepatic cysts, unchanged.  Spleen and pancreas and adrenal glands are normal. Dilatation of the abdominal aorta to a maximal diameter of 2.7 cm.  Again noted is asymmetric enlargement of the prostate gland with thickening of the bladder wall asymmetrically, unchanged.  Again noted are chronic changes in the pelvic bones and proximal femurs as well as in the T11 vertebral body.  These are stable.  IMPRESSION: Edema of the mucosa of the entire descending colon.  There is also edema in the cecum and proximal ascending colon.  Findings consistent with colitis.  Ischemic colitis should be considered particularly in  a patient of this age.   Original Report Authenticated By: Francene Boyers, M.D.    Nm Gi Blood Loss  12/13/2012  *RADIOLOGY  REPORT*  Clinical Data: Lower gastrointestinal bleeding  NUCLEAR MEDICINE GASTROINTESTINAL BLEEDING STUDY  Technique:  Sequential abdominal images were obtained following intravenous administration of Tc-10m labeled red blood cells.  Radiopharmaceutical: 23.5MILLI CURIE ULTRATAG TECHNETIUM TC 34M- LABELED RED BLOOD CELLS IV KIT  Comparison: CT scan 12/04/2012, nuclear medicine GI bleeding scan 11/21/2011  Findings: There is no evidence of active gastrointestinal bleeding during the first 60 minutes of the tagged red blood cell scan. During the second 60 minutes, a focus of activity begins to accumulate within the left lower quadrant from approximately 95 minutes to 120 minutes.  This is subtle activity.  This is in the vicinity of the positive activity on comparison scan from 11/21/2011.  In reviewing the scan from 11/20/2012, I believe this activity is in the same location and the sources is the distal descending colon/proximal sigmoid colon.  IMPRESSION:  Small focus of active gastrointestinal bleeding occurs late in the scan in the left lower quadrant.  Legrand Rams this to be within the proximal sigmoid colon. Bleeding much less brisk than  comparison tagged red blood cell scan 01/17 1013.  Findings discussed with Dr. Waunita Schooner with these on 12/13/2012 at 1645 hours   Original Report Authenticated By: Genevive Bi, M.D.     Labs: Basic Metabolic Panel:  Recent Labs Lab 12/10/12 1121 12/11/12 0640 12/12/12 0655 12/15/12 0540  NA 142 140 141 141  K 3.8 3.4* 3.7 3.5  CL 107 107 109 107  CO2 28 24 23 25   GLUCOSE 103* 99 99 93  BUN 10 6 8 8   CREATININE 1.05 0.87 0.92 1.00  CALCIUM 9.3 8.6 8.8 8.9   Liver Function Tests:  Recent Labs Lab 12/10/12 1121  AST 26  ALT 29  ALKPHOS 43  BILITOT 0.4  PROT 6.3  ALBUMIN 3.6   CBC:  Recent Labs Lab 12/12/12 0655  12/13/12 0807 12/14/12 0555 12/14/12 1138 12/15/12 0540  WBC 4.3 4.3 4.4 4.0 5.2  HGB 8.9* 8.8* 9.1* 8.9* 8.9*  HCT 25.3* 25.1* 26.1* 25.3* 25.6*  MCV 97.7 98.4 98.9 98.1 97.7  PLT 130* 141* 143* 137* 158    Signed:  Conley Canal [phone]  [facility] 12/15/2012, 11:34 AM

## 2012-12-15 NOTE — Progress Notes (Signed)
-   agree with above. - colonoscopy in am.

## 2012-12-15 NOTE — Progress Notes (Addendum)
Frank Kramer to be D/C'd Home per MD order.  Discharge instructions reviewed and discussed with the patient and daughter Gene, all questions and concerns answered. Copy of instructions and scripts given to patient. Patients skin is clean, dry and intact no evidence of skin break down. IV site discontinued and catheter remains intact. Site without signs and symptoms of complications. Dressing and pressure applied. Patient given Xanax prior to discharge will be unable to reassess. Patient is leaving to go to home and needed because wife just recently passed away at their home will he was hospitalized  Patient escorted to car by NT in a wheelchair,  no distress noted upon discharge.  Bing Quarry 12/15/2012 3:46 PM

## 2012-12-16 ENCOUNTER — Encounter (HOSPITAL_COMMUNITY): Payer: Self-pay | Admitting: Gastroenterology

## 2012-12-16 NOTE — Op Note (Signed)
[facility] [address] [place], [address]   COLONOSCOPY PROCEDURE REPORT  PATIENT: Randle, Shatzer.  MR#: [id number] BIRTHDATE: 01-30-1922 , 89  yrs. old GENDER: Male ENDOSCOPIST: Dorena Cookey, MD REFERRED BY: PROCEDURE DATE:  12/15/2012 PROCEDURE: ASA CLASS: INDICATIONS:  lower GI MEDICATIONS:   fentanyl 50 mcg, Versed 4  DESCRIPTION OF PROCEDURE: excellent prep to the cecum identified by transillumination McBurney's point in visualization of the ileocecal valve and appendiceal orifice. Cecum descending colon normal. Transverse descending sigmoid with multiple diverticula with no blood and no stigmata of hemorrhage. Rectum normal     COMPLICATIONS: None  ENDOSCOPIC IMPRESSION:sided diverticulosis no evidence of bleeding or any current stigmata of hemorrhage  RECOMMENDATIONS:expectant management. Avoid anticoagulation if possible.    _______________________________ eSigned:  Dorena Cookey, MD 12/15/2012 9:39 AM

## 2012-12-18 ENCOUNTER — Ambulatory Visit: Payer: Medicare Other | Admitting: Oncology

## 2012-12-18 ENCOUNTER — Other Ambulatory Visit: Payer: Medicare Other | Admitting: Lab

## 2012-12-31 ENCOUNTER — Other Ambulatory Visit: Payer: Self-pay | Admitting: Oncology

## 2012-12-31 ENCOUNTER — Encounter: Payer: Self-pay | Admitting: *Deleted

## 2012-12-31 DIAGNOSIS — D472 Monoclonal gammopathy: Secondary | ICD-10-CM

## 2013-01-01 ENCOUNTER — Telehealth: Payer: Self-pay | Admitting: Oncology

## 2013-01-01 NOTE — Telephone Encounter (Signed)
S/w the pt and he is aware of his march appts °

## 2013-01-05 ENCOUNTER — Other Ambulatory Visit: Payer: Medicare Other | Admitting: Lab

## 2013-01-12 ENCOUNTER — Ambulatory Visit: Payer: Medicare Other | Admitting: Nurse Practitioner

## 2013-01-12 ENCOUNTER — Other Ambulatory Visit: Payer: Medicare Other | Admitting: Lab

## 2013-01-12 ENCOUNTER — Other Ambulatory Visit (HOSPITAL_BASED_OUTPATIENT_CLINIC_OR_DEPARTMENT_OTHER): Payer: Medicare Other | Admitting: Lab

## 2013-01-12 ENCOUNTER — Telehealth: Payer: Self-pay | Admitting: Oncology

## 2013-01-12 ENCOUNTER — Ambulatory Visit (HOSPITAL_BASED_OUTPATIENT_CLINIC_OR_DEPARTMENT_OTHER): Payer: Medicare Other | Admitting: Nurse Practitioner

## 2013-01-12 VITALS — BP 177/84 | HR 78 | Temp 96.8°F | Resp 18 | Ht 67.0 in | Wt 137.2 lb

## 2013-01-12 DIAGNOSIS — D472 Monoclonal gammopathy: Secondary | ICD-10-CM

## 2013-01-12 DIAGNOSIS — C8589 Other specified types of non-Hodgkin lymphoma, extranodal and solid organ sites: Secondary | ICD-10-CM

## 2013-01-12 DIAGNOSIS — F329 Major depressive disorder, single episode, unspecified: Secondary | ICD-10-CM

## 2013-01-12 DIAGNOSIS — D649 Anemia, unspecified: Secondary | ICD-10-CM

## 2013-01-12 LAB — CBC WITH DIFFERENTIAL/PLATELET
BASO%: 0.6 % (ref 0.0–2.0)
EOS%: 2.6 % (ref 0.0–7.0)
MCH: 33.1 pg (ref 27.2–33.4)
MCV: 97.3 fL (ref 79.3–98.0)
MONO%: 9.9 % (ref 0.0–14.0)
RBC: 3.51 10*6/uL — ABNORMAL LOW (ref 4.20–5.82)
RDW: 13.7 % (ref 11.0–14.6)
lymph#: 0.9 10*3/uL (ref 0.9–3.3)

## 2013-01-12 LAB — COMPREHENSIVE METABOLIC PANEL (CC13)
Albumin: 3.3 g/dL — ABNORMAL LOW (ref 3.5–5.0)
BUN: 18.9 mg/dL (ref 7.0–26.0)
Calcium: 9.2 mg/dL (ref 8.4–10.4)
Chloride: 109 mEq/L — ABNORMAL HIGH (ref 98–107)
Creatinine: 0.9 mg/dL (ref 0.7–1.3)
Glucose: 90 mg/dl (ref 70–99)
Potassium: 3.8 mEq/L (ref 3.5–5.1)

## 2013-01-12 NOTE — Progress Notes (Signed)
OFFICE PROGRESS NOTE  Interval history:  [name] is an 79 year old man with a remote history of high-grade B-cell non-Hodgkin's lymphoma dating to 1997 treated with CHOP chemotherapy. He achieved a complete and durable remission.  In August 2004 he developed a monoclonal gammopathy of undetermined significance. There have been no signs of transformation to multiple myeloma. Lab 05/29/2012 showed a rise in the kappa serum free light chains to 11.6 as compared to 1.57 on 08/13/2011 and 7.19 on 02/11/2011.  He has been hospitalized 2 times this year with C. difficile associated diarrhea and rectal bleeding. He has completed treatment with Flagyl and vancomycin.  3 days ago he had 3 or 4 watery stools. He took Imodium. Stools have since been formed. No further rectal bleeding. No nausea or vomiting.  During one of the hospitalizations his wife died. He lost a significant amount of weight. His appetite is beginning to improve and he is gaining weight.  He has intermittent pain at the right hip.   Objective: Blood pressure 177/84, pulse 78, temperature 96.8 F (36 C), temperature source Oral, resp. rate 18, height 5\' 7"  (1.702 m), weight 137 lb 3.2 oz (62.234 kg).  Oropharynx is without thrush or ulceration. No palpable cervical, supraclavicular or axillary lymph nodes. Lungs are clear. No wheezes or rales. Regular cardiac rhythm. Abdomen is soft and nontender. No organomegaly. Extremities are without edema.  Lab Results: Lab Results  Component Value Date   WBC 4.7 01/12/2013   HGB 11.6* 01/12/2013   HCT 34.1* 01/12/2013   MCV 97.3 01/12/2013   PLT 145 01/12/2013    Chemistry:    Chemistry      Component Value Date/Time   NA 143 01/12/2013 0952   NA 141 12/15/2012 0540   K 3.8 01/12/2013 0952   K 3.5 12/15/2012 0540   CL 109* 01/12/2013 0952   CL 107 12/15/2012 0540   CO2 26 01/12/2013 0952   CO2 25 12/15/2012 0540   BUN 18.9 01/12/2013 0952   BUN 8 12/15/2012 0540   CREATININE 0.9  01/12/2013 0952   CREATININE 1.00 12/15/2012 0540      Component Value Date/Time   CALCIUM 9.2 01/12/2013 0952   CALCIUM 8.9 12/15/2012 0540   ALKPHOS 81 01/12/2013 0952   ALKPHOS 43 12/10/2012 1121   AST 15 01/12/2013 0952   AST 26 12/10/2012 1121   ALT 18 01/12/2013 0952   ALT 29 12/10/2012 1121   BILITOT 0.40 01/12/2013 0952   BILITOT 0.4 12/10/2012 1121       Studies/Results: Nm Gi Blood Loss  12/13/2012  *RADIOLOGY REPORT*  Clinical Data: Lower gastrointestinal bleeding  NUCLEAR MEDICINE GASTROINTESTINAL BLEEDING STUDY  Technique:  Sequential abdominal images were obtained following intravenous administration of Tc-71m labeled red blood cells.  Radiopharmaceutical: 23.5MILLI CURIE ULTRATAG TECHNETIUM TC 41M- LABELED RED BLOOD CELLS IV KIT  Comparison: CT scan 12/04/2012, nuclear medicine GI bleeding scan 11/21/2011  Findings: There is no evidence of active gastrointestinal bleeding during the first 60 minutes of the tagged red blood cell scan. During the second 60 minutes, a focus of activity begins to accumulate within the left lower quadrant from approximately 95 minutes to 120 minutes.  This is subtle activity.  This is in the vicinity of the positive activity on comparison scan from 11/21/2011.  In reviewing the scan from 11/20/2012, I believe this activity is in the same location and the sources is the distal descending colon/proximal sigmoid colon.  IMPRESSION:  Small focus of active gastrointestinal bleeding occurs  late in the scan in the left lower quadrant.  Legrand Rams this to be within the proximal sigmoid colon. Bleeding much less brisk than  comparison tagged red blood cell scan 01/17 1013.  Findings discussed with Dr. Radonna Kramer Frank Kramer with these on 12/13/2012 at 1645 hours   Original Report Authenticated By: Frank Kramer, M.D.     Medications: I have reviewed the patient's current medications.  Assessment/Plan:  1. High-grade B-cell non-Hodgkin's lymphoma diagnosed 1997 status post CHOP  chemotherapy achieving a complete and durable remission. 2. IgG kappa monoclonal gammopathy of undetermined significance. Repeat values from today are pending. 3. 2 recent hospitalizations with C. difficile colitis and rectal bleeding. 4. Anemia related to GI blood loss. The hemoglobin is improving as compared to value at time of discharge 12/15/2012. 5. BPH. 6. Essential hypertension. 7. History of nephrolithiasis. 8. Paget's disease of the bone. 9. Depression related to recent death of wife. I offered a referral for grief counseling. He feels he is coping appropriately and has 3 daughters providing support.  Disposition-we will followup on the serum light chains from today. He will return for a followup visit in 6 months. He will contact the office in the interim with any problems.  Patient seen with Dr. Cyndie Kramer.  Frank Kramer ANP/GNP-BC

## 2013-01-12 NOTE — Telephone Encounter (Signed)
TAlked to pt and gave him appt for lab and MD on September 2014

## 2013-01-19 LAB — IMMUNOFIXATION ELECTROPHORESIS
IgG (Immunoglobin G), Serum: 1460 mg/dL (ref 650–1600)
Total Protein, Serum Electrophoresis: 6.4 g/dL (ref 6.0–8.3)

## 2013-01-21 ENCOUNTER — Telehealth: Payer: Self-pay | Admitting: *Deleted

## 2013-01-21 NOTE — Telephone Encounter (Signed)
Spoke with pt.  Let him know that lab work is stable.  He appreciated the call back.

## 2013-01-21 NOTE — Telephone Encounter (Signed)
Message copied by Orbie Hurst on Thu Jan 21, 2013  4:13 PM ------      Message from: Frank Kramer      Created: Tue Jan 19, 2013  8:17 AM       Call pt lab values stable ------

## 2013-02-03 ENCOUNTER — Encounter: Payer: Self-pay | Admitting: Internal Medicine

## 2013-07-09 ENCOUNTER — Other Ambulatory Visit: Payer: Medicare Other

## 2013-07-16 ENCOUNTER — Other Ambulatory Visit (HOSPITAL_BASED_OUTPATIENT_CLINIC_OR_DEPARTMENT_OTHER): Payer: Medicare Other

## 2013-07-16 ENCOUNTER — Ambulatory Visit: Payer: Medicare Other | Admitting: Oncology

## 2013-07-16 DIAGNOSIS — D472 Monoclonal gammopathy: Secondary | ICD-10-CM

## 2013-07-16 LAB — CBC WITH DIFFERENTIAL/PLATELET
Basophils Absolute: 0 10*3/uL (ref 0.0–0.1)
Eosinophils Absolute: 0.1 10*3/uL (ref 0.0–0.5)
HCT: 37.7 % — ABNORMAL LOW (ref 38.4–49.9)
HGB: 13.4 g/dL (ref 13.0–17.1)
MONO#: 0.5 10*3/uL (ref 0.1–0.9)
NEUT#: 2.1 10*3/uL (ref 1.5–6.5)
NEUT%: 54.1 % (ref 39.0–75.0)
RDW: 13.2 % (ref 11.0–14.6)
lymph#: 1.2 10*3/uL (ref 0.9–3.3)

## 2013-07-16 LAB — COMPREHENSIVE METABOLIC PANEL (CC13)
AST: 14 U/L (ref 5–34)
Alkaline Phosphatase: 70 U/L (ref 40–150)
BUN: 14.4 mg/dL (ref 7.0–26.0)
Creatinine: 1 mg/dL (ref 0.7–1.3)
Potassium: 4 mEq/L (ref 3.5–5.1)

## 2013-07-19 ENCOUNTER — Encounter: Payer: Self-pay | Admitting: Oncology

## 2013-07-19 ENCOUNTER — Telehealth: Payer: Self-pay | Admitting: Oncology

## 2013-07-19 ENCOUNTER — Ambulatory Visit (HOSPITAL_BASED_OUTPATIENT_CLINIC_OR_DEPARTMENT_OTHER): Payer: Medicare Other | Admitting: Oncology

## 2013-07-19 VITALS — BP 164/70 | HR 85 | Temp 98.0°F | Resp 20 | Ht 67.0 in | Wt 140.9 lb

## 2013-07-19 DIAGNOSIS — D472 Monoclonal gammopathy: Secondary | ICD-10-CM

## 2013-07-19 DIAGNOSIS — C44319 Basal cell carcinoma of skin of other parts of face: Secondary | ICD-10-CM

## 2013-07-19 DIAGNOSIS — Z87898 Personal history of other specified conditions: Secondary | ICD-10-CM

## 2013-07-19 DIAGNOSIS — C44310 Basal cell carcinoma of skin of unspecified parts of face: Secondary | ICD-10-CM

## 2013-07-19 DIAGNOSIS — Z8572 Personal history of non-Hodgkin lymphomas: Secondary | ICD-10-CM

## 2013-07-19 DIAGNOSIS — J310 Chronic rhinitis: Secondary | ICD-10-CM

## 2013-07-19 HISTORY — DX: Personal history of non-Hodgkin lymphomas: Z85.72

## 2013-07-19 MED ORDER — CETIRIZINE HCL 10 MG PO TABS: 5.0000 mg | ORAL_TABLET | Freq: Every day | ORAL | Status: DC

## 2013-07-19 NOTE — Telephone Encounter (Signed)
[facility] PT APPT SCHEDULE FOR September 2015. PER RELATIVE LB/FU SAME DATE. LB ALSO DATED FOR SAME DATE REQUESTED FOR F/U ON THE POF.

## 2013-07-20 ENCOUNTER — Encounter: Payer: Self-pay | Admitting: Oncology

## 2013-07-20 DIAGNOSIS — C44310 Basal cell carcinoma of skin of unspecified parts of face: Secondary | ICD-10-CM

## 2013-07-20 HISTORY — DX: Basal cell carcinoma of skin of unspecified parts of face: C44.310

## 2013-07-20 LAB — KAPPA/LAMBDA LIGHT CHAINS
Kappa free light chain: 9.83 mg/dL — ABNORMAL HIGH (ref 0.33–1.94)
Kappa:Lambda Ratio: 8.47 — ABNORMAL HIGH (ref 0.26–1.65)
Lambda Free Lght Chn: 1.16 mg/dL (ref 0.57–2.63)

## 2013-07-20 LAB — IMMUNOFIXATION ELECTROPHORESIS: IgM, Serum: 59 mg/dL (ref 41–251)

## 2013-07-20 NOTE — Progress Notes (Signed)
Hematology and Oncology Follow Up Visit  Frank Kramer [id number] September 22, 1922 79 y.o. 07/20/2013 7:54 AM   Principle Diagnosis: Encounter Diagnoses  Name Primary?  Marland Kitchen MGUS (monoclonal gammopathy of unknown significance) Yes  . Rhinitis   . Hx of lymphoma, non-Hodgkins   . Rhinitis, chronic      Interim History:   Follow up visit for this 79 year-old man with a remote history of high-grade non-Hodgkin's lymphoma diagnosed in September 1997. He was treated with CHOP chemotherapy prior to the Rituxan era and is likely cured. Further evaluation of normochromic anemia in August 2004 revealed that he had developed a kappa monoclonal gammopathy. This has been followed now for over 10 years and there have been no signs of progression to multiple myeloma.  He has a number of chronic urologic problems followed by Dr. Lenn Kramer including BPH and intermittent episodes of hematuria. No bladder pathology on cystoscopy exams.  At time of most recent visit here in March, he had just lost his wife who was in poor health and died at the age of 79. Since that time one of his daughters was diagnosed and treated for lung cancer and is currently in remission. Another daughter is handicapped by multiple sclerosis. A third daughter is working 13 hours a day. Frank Kramer is still living alone.  He has recently had a number of basal cell carcinomas removed from his right eyelid, nasal region, and parietal region right side of face by Frank Kramer.  He had a colonoscopy back in February, 2014 following an episode of C. difficile colitis and hematochezia. He has a history of adenomatous polyps of the colon. Other than diverticulosis, the study was normal with no focal areas of bleeding or malignancy.    Medications: reviewed  Allergies:  Allergies  Allergen Reactions  . Aspirin     GI bleeding  . Ciprofloxacin Other (See Comments)    Caused GI bleed  . Codeine Other (See Comments)    Patient cannot recall  reaction  . Iohexol      Desc: HAS SEIZURES WITH X-RAY DYE-GIVEN 120 MG SOLU0MEDROL, 25 MG BENADRYL, AND 25 MG PEPCID 1 HOUR PRIOR TO EXAM AND HAD NO PROBLEMS-ARS-08/15/07   . Macrodantin Other (See Comments)    Patient cannot recall reaction  . Morphine And Related Itching    Review of Systems: Constitutional:   Generalized weakness. Appetite good. Weight stable HEENT no sore throat. He has had problems with persistent rhinitis not responsive to multiple antihistamines. Respiratory: No cough or dyspnea Cardiovascular:  No chest pain chest pressure or palpitations Gastrointestinal: No change in bowel habit. No hematochezia or melena Genito-Urinary: Chronic urinary frequency. 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 He was told he is no longer a candidate for epidural steroid injections. Neurologic: Chronic occipital headaches related to arthritis. No focal weakness. No paresthesias. Skin: See above. Recent excision of basal cell carcinomas  Remaining ROS negative.     Physical Exam: Blood pressure 164/70, pulse 85, temperature 98 F (36.7 C), temperature source Oral, resp. rate 20, height 5\' 7"  (1.702 m), weight 140 lb 14.4 oz (63.912 kg). Wt Readings from Last 3 Encounters:  07/19/13 140 lb 14.4 oz (63.912 kg)  01/12/13 137 lb 3.2 oz (62.234 kg)  12/15/12 133 lb (60.328 kg)     General appearance: Thin Caucasian man HENNT: Pharynx no erythema or exudate. No thyromegaly. Full range of motion. Lymph nodes: No cervical, supraclavicular, or axillary adenopathy  Breasts: Lungs: Clear to auscultation, resonant to percussion Heart: Regular rhythm, no murmur, no gallop Abdomen: Soft, nontender, no mass, no organomegaly Extremities: No edema, no calf tenderness Musculoskeletal: No joint deformities GU: Vascular: No carotid bruits, no cyanosis Neurologic: Mental status intact, cranial nerves grossly normal, motor  strength 5 over 5, reflexes absent at the right knee from previous back surgery. Left knee 2+ biceps 1+ symmetric Skin: Healing areas on his face and nose from recent excision of skin tumors. No rash, no ecchymosis  Lab Results: Lab Results  Component Value Date   WBC 4.0 07/16/2013   HGB 13.4 07/16/2013   HCT 37.7* 07/16/2013   MCV 95.8 07/16/2013   PLT 127* 07/16/2013     Chemistry      Component Value Date/Time   NA 141 07/16/2013 0920   NA 141 12/15/2012 0540   K 4.0 07/16/2013 0920   K 3.5 12/15/2012 0540   CL 109* 01/12/2013 0952   CL 107 12/15/2012 0540   CO2 28 07/16/2013 0920   CO2 25 12/15/2012 0540   BUN 14.4 07/16/2013 0920   BUN 8 12/15/2012 0540   CREATININE 1.0 07/16/2013 0920   CREATININE 1.00 12/15/2012 0540      Component Value Date/Time   CALCIUM 9.8 07/16/2013 0920   CALCIUM 8.9 12/15/2012 0540   ALKPHOS 70 07/16/2013 0920   ALKPHOS 43 12/10/2012 1121   AST 14 07/16/2013 0920   AST 26 12/10/2012 1121   ALT 13 07/16/2013 0920   ALT 29 12/10/2012 1121   BILITOT 0.78 07/16/2013 0920   BILITOT 0.4 12/10/2012 1121    kappa free light chains 9.8 mg percent lambda 1.16, ratio 8.47, 07/16/2013 stable compare with previous values Most recent 24-hour urine 08/20/2011 total protein 269 milligrams stable compared with multiple previous collections.   Impression: #1. Cured high-grade non-Hodgkin's lymphoma diagnosed and treated September 1997.  #2. Kappa light chain monoclonal gammopathy of undetermined significance diagnosed August 2004. Stable mild proteinuria and normochromic anemia. Hemoglobin today actually better than his baseline at 13.4 g.  #3. Mild, stable, thrombocytopenia  #4. Coronary artery disease 50% LAD lesion treated medically  #5. Adenomatous polyps of the colon recent normal colonoscopy February 2014  #6. History of both renal and bladder calculi  #7. Benign prostatic hyperplasia status post TURP  #8. History of gross hematuria are related to urinary tract  infections  #9. Recurrent UTIs  #10. Peptic ulcer disease  #11. Status post excision multiple basal cell carcinomas of the skin 8/14  #12. Chronic rhinitis. He feels that the only antihistamine that has helped him was brand Zyrtec. The generic doesn't seem to work. I gave him a new prescription for the brand Zyrtec.  He would like to decrease visits to once a year. I think this is reasonable. He has good medical followup with multiple other physicians.    CC:. Dr. Blair Heys; Dr. Marcine Matar   Levert Feinstein, MD 9/16/20147:54 AM

## 2013-07-21 LAB — UIFE/LIGHT CHAINS/TP QN, 24-HR UR
Albumin, U: DETECTED
Alpha 1, Urine: DETECTED — AB
Alpha 2, Urine: DETECTED — AB
Beta, Urine: DETECTED — AB
Free Kappa Lt Chains,Ur: 2.88 mg/dL — ABNORMAL HIGH (ref 0.14–2.42)
Free Kappa/Lambda Ratio: 36 ratio — ABNORMAL HIGH (ref 2.04–10.37)
Free Lambda Excretion/Day: 1.2 mg/d
Free Lambda Lt Chains,Ur: 0.08 mg/dL (ref 0.02–0.67)
Free Lt Chn Excr Rate: 43.2 mg/d
Gamma Globulin, Urine: DETECTED — AB
Time: 24 h
Total Protein, Urine-Ur/day: 69 mg/d (ref 10–140)
Total Protein, Urine: 4.6 mg/dL
Volume, Urine: 1500 mL

## 2013-07-22 ENCOUNTER — Telehealth: Payer: Self-pay | Admitting: *Deleted

## 2013-07-22 NOTE — Telephone Encounter (Signed)
Pt notified of stable antibody levels per Dr Roanna Raider instructions.

## 2013-07-22 NOTE — Telephone Encounter (Signed)
Message copied by Sabino Snipes on Thu Jul 22, 2013  1:40 PM ------      Message from: Levert Feinstein      Created: Thu Jul 22, 2013 12:29 PM       Call pt antibody levels stable ------

## 2014-01-01 ENCOUNTER — Encounter: Payer: Self-pay | Admitting: Oncology

## 2014-01-28 ENCOUNTER — Telehealth: Payer: Self-pay | Admitting: Internal Medicine

## 2014-01-28 DIAGNOSIS — R002 Palpitations: Secondary | ICD-10-CM

## 2014-01-28 DIAGNOSIS — R000 Tachycardia, unspecified: Secondary | ICD-10-CM

## 2014-01-28 NOTE — Telephone Encounter (Signed)
Forward to amber  Could please check on this Monday 01/31/14

## 2014-01-28 NOTE — Telephone Encounter (Signed)
Spoke with daughter . She states father heart rate was elevated earlier today -198 -178.  EMS WAS CALLED AND NOW AT THE HOUSE -  PER DAUGHTER EMS DOES NOT WANT TAKE PATIENT TO HOSPITAL BECAUSE HE IS  STABLE NOW HEART IS 4 'S , NO OTHER SYMPTOMS.    DAUGHTER WOULD LIKE TO KNOW IF SCHEDULE APPOINTMENT CAN BE MOVED UP AND/OR A MONITOR BE STARTED. RN  DISCUSSED WITH DR HILTY . PER DR HILTY - KEEP APPOINTMENT AN ORDER FOR MONITOR  2 WEEKS    INFORMED DAUGHTER - SHE WOULD PREFER HER FATHER COME TO OFFICE TO PUT MONITOR INSTEAD OF HAVING MONITOR MAILED TO PATIENT.RN LOOKED THERE IS NOT MONITOR IN STOCK TODAY RN INFORMED DAUGHTER TO CALL ON Monday TO SEE IF MONITOR IS AVAILABLE.  SCHEDULE FOR LATER ON Monday.

## 2014-01-31 ENCOUNTER — Encounter (INDEPENDENT_AMBULATORY_CARE_PROVIDER_SITE_OTHER): Payer: Medicare Other | Admitting: *Deleted

## 2014-01-31 ENCOUNTER — Telehealth: Payer: Self-pay | Admitting: Internal Medicine

## 2014-01-31 ENCOUNTER — Other Ambulatory Visit: Payer: Self-pay | Admitting: *Deleted

## 2014-01-31 DIAGNOSIS — R002 Palpitations: Secondary | ICD-10-CM

## 2014-01-31 DIAGNOSIS — R000 Tachycardia, unspecified: Secondary | ICD-10-CM

## 2014-01-31 NOTE — Telephone Encounter (Signed)
Returned call and pt verified x 2 w/ pt's daughter, Romie Minus.  Informed message received and there are no monitors in the office at the present time.  Informed will have to wait for one to arrive or one can be ordered and mailed to pt's address and she can call back for an appt to have it placed if not comfortable doing at home.  Daughter verbalized understanding and wanted to wait for monitor to arrive.  Informed RN will leave a note to check for arrival and advised she call back by this time tomorrow to check as a shipment should be received if they are sent.  Verbalized understanding and agreed w/ plan.

## 2014-01-31 NOTE — Telephone Encounter (Signed)
Pt needs to wear a monitor for 2 weeks. She wants to know when he came come in to get the monitor put on?

## 2014-01-31 NOTE — Telephone Encounter (Signed)
Monitors located at [employer].  Call back to [facility] and informed.  Appt was previously scheduled for today at 3:30pm.  Romie Minus agreed to bring pt in for appt.

## 2014-02-18 ENCOUNTER — Encounter: Payer: Self-pay | Admitting: Internal Medicine

## 2014-02-18 ENCOUNTER — Ambulatory Visit (INDEPENDENT_AMBULATORY_CARE_PROVIDER_SITE_OTHER): Payer: Medicare Other | Admitting: Internal Medicine

## 2014-02-18 VITALS — BP 148/82 | HR 82 | Ht 67.0 in | Wt 145.5 lb

## 2014-02-18 DIAGNOSIS — I951 Orthostatic hypotension: Secondary | ICD-10-CM

## 2014-02-18 DIAGNOSIS — I10 Essential (primary) hypertension: Secondary | ICD-10-CM

## 2014-02-18 DIAGNOSIS — R002 Palpitations: Secondary | ICD-10-CM

## 2014-02-18 MED ORDER — CARVEDILOL 25 MG PO TABS: 25.0000 mg | ORAL_TABLET | Freq: Two times a day (BID) | ORAL | Status: DC

## 2014-02-18 NOTE — Progress Notes (Signed)
OFFICE NOTE  Chief Complaint:  Palpitations  Primary Care Physician: Lujean Amel, MD  HPI:  Frank Kramer is an 80 year old gentleman with history of mild coronary disease, hypertension, BPH, dyslipidemia. He has had some problems with GI bleeding. In fact, in January and February of this year he had C. difficile colitis and GI bleeding. Unfortunately, he has been grieving now with the loss of his wife about 6-8 weeks ago. She was a long-time patient of our practice as well. Overall, he has had some difficult-to-control hypertension. However, with recent changes in his medications, including an increase in Norvasc and addition of enalapril, his blood pressure is excellent, 110/62 today. He recently has had episodes of palpitations. He does notice them at home and once took his Lopressor which was elevated. His heart rate was reportedly over 200, however I am concerned about the accuracy of this. He did call 911 however refused to be transported to hospital. Based on this I recommended placement of a 2 week monitor and followup in our office. He wore a CardioNet monitor between 01/31/2014 and 02/13/2014. Heart rate ranged from 50 beats per minute to 128 beats per minute. There was no significant finding except for a 4 beat run of nonsustained VT. This occurred on 02/07/2014.  He's denied any chest pain or worsening shortness of breath.  PMHx:  Past Medical History  Diagnosis Date  . Arthritis   . Hypertension   . High cholesterol   . GERD (gastroesophageal reflux disease)   . Panic     panic disorder  . MGUS (monoclonal gammopathy of unknown significance) 06/07/2012  . Rhinitis 06/07/2012  . Paget's bone disease   . Skin cancer   . Non Hodgkin's lymphoma dx'd 1997  . C. difficile colitis   . CAD (coronary artery disease) 02/08/2009    LHC: dilated ectatic segment in the midportion of the LAD with mild to mod. narrowing just distal to this.  Marland Kitchen Hx of lymphoma, non-Hodgkins 07/19/2013  .  Basal cell carcinoma of face 07/20/2013    3 lesions excised Dr Link Snuffer approx  7/14    Past Surgical History  Procedure Laterality Date  . Back surgery    . Neck surgery      c2  . Skin biopsy    . Hernia repair    . Prostatectomy    . Colonoscopy N/A 12/15/2012    Procedure: COLONOSCOPY;  Surgeon: Missy Sabins, MD;  Location: [place];  Service: Endoscopy;  Laterality: N/A;  . Cardiac catheterization  02/08/2009    nonocclusive CAD    FAMHx:  Family History  Problem Relation Age of Onset  . Hypertension Mother   . Heart failure Mother   . Hypertension Father   . Cancer Brother   . Heart failure Sister     SOCHx:   reports that he quit smoking about 45 years ago. He has never used smokeless tobacco. He reports that he does not drink alcohol or use illicit drugs.  ALLERGIES:  Allergies  Allergen Reactions  . Aspirin     GI bleeding  . Ciprofloxacin Other (See Comments)    Caused GI bleed  . Codeine Other (See Comments)    Patient cannot recall reaction  . Iohexol      Desc: HAS SEIZURES WITH X-RAY DYE-GIVEN 120 MG SOLU0MEDROL, 25 MG BENADRYL, AND 25 MG PEPCID 1 HOUR PRIOR TO EXAM AND HAD NO PROBLEMS-ARS-08/15/07   . Macrodantin Other (See Comments)    Patient cannot  recall reaction  . Morphine And Related Itching    ROS: A comprehensive review of systems was negative except for: Cardiovascular: positive for palpitations  HOME MEDS: Current Outpatient Prescriptions  Medication Sig Dispense Refill  . alendronate (FOSAMAX) 70 MG tablet Take 70 mg by mouth every 7 (seven) days. Take with a full glass of water on an empty stomach. Takes on Saturdays.      . ALPRAZolam (XANAX) 0.5 MG tablet Take 1 tablet (0.5 mg total) by mouth 2 (two) times daily as needed. anxiety  20 tablet  0  . butalbital-acetaminophen-caffeine (FIORICET, ESGIC) 50-325-40 MG per tablet Take 1 tablet by mouth 2 (two) times daily as needed. For headaches.      . carvedilol (COREG) 25 MG tablet  Take 1 tablet (25 mg total) by mouth 2 (two) times daily.  180 tablet  1  . cetirizine (ZYRTEC) 10 MG tablet Take 0.5 tablets (5 mg total) by mouth daily.  30 tablet  1  . cholecalciferol (VITAMIN D) 1000 UNITS tablet Take 1,000 Units by mouth daily.      . enalapril (VASOTEC) 10 MG tablet Take 5 mg by mouth daily.       . ferrous sulfate 325 (65 FE) MG tablet Take 325 mg by mouth daily.       . finasteride (PROSCAR) 5 MG tablet Take 5 mg by mouth daily.      . fluocinonide ointment (LIDEX) 4.69 % Apply 1 application topically 2 (two) times daily. rash      . HYDROcodone-acetaminophen (NORCO/VICODIN) 5-325 MG per tablet Take 1 tablet by mouth every 6 (six) hours as needed. Use sparingly as it can cause a drop in blood pressure  30 tablet  0  . ipratropium (ATROVENT) 0.03 % nasal spray Place 2 sprays into both nostrils 2 (two) times daily.      . mometasone (NASONEX) 50 MCG/ACT nasal spray Place 2 sprays into the nose 2 (two) times daily.      . polycarbophil (FIBERCON) 625 MG tablet Take 1 tablet (625 mg total) by mouth daily. Do not restart for 1 week.      . silodosin (RAPAFLO) 8 MG CAPS capsule Take 8 mg by mouth daily with breakfast.      . amLODipine (NORVASC) 2.5 MG tablet Take 2.5 mg by mouth daily.       No current facility-administered medications for this visit.    LABS/IMAGING: No results found for this or any previous visit (from the past 48 hour(s)). No results found.  VITALS: BP 148/82  Pulse 82  Ht 5\' 7"  (1.702 m)  Wt 145 lb 8 oz (65.998 kg)  BMI 22.78 kg/m2  EXAM: General appearance: alert and no distress Neck: no carotid bruit and no JVD Lungs: clear to auscultation bilaterally Heart: regular rate and rhythm, S1, S2 normal, no murmur, click, rub or gallop Abdomen: soft, non-tender; bowel sounds normal; no masses,  no organomegaly Extremities: extremities normal, atraumatic, no cyanosis or edema Pulses: 2+ and symmetric Skin: Skin color, texture, turgor normal. No  rashes or lesions Neurologic: Grossly normal Psych: Mood, affect normal  EKG: Normal sinus rhythm at 82  ASSESSMENT: 1. Palpitations-and short episode of NSVT 2. Mild, nonobstructive coronary disease by cath in 2010 3. Hypertension 4. Dyslipidemia  PLAN: 1.   Mr. Baby has been having palpitations. All that was recorded on his monitor was a short 4 beat run of NSVT. It is feasible he could be having more of this. His cath  in 2010 that did not show any significant coronary disease. He has not been having chest pain or worsening shortness of breath. I would recommend increasing his carvedilol to 25 mg twice daily. If his symptoms persist he may need stress testing or echocardiogram to further evaluate for possible cause of his palpitations.  Plan followup in 3 months to reassess his symptoms.  Pixie Casino, MD, [facility] Attending Cardiologist [place] 02/18/2014, 2:19 PM

## 2014-02-18 NOTE — Patient Instructions (Signed)
INCREASE carvedilol to 25mg  twice daily.   Your physician recommends that you schedule a follow-up appointment in: 3 months.

## 2014-05-20 ENCOUNTER — Ambulatory Visit (INDEPENDENT_AMBULATORY_CARE_PROVIDER_SITE_OTHER): Payer: Medicare Other | Admitting: Internal Medicine

## 2014-05-20 ENCOUNTER — Encounter: Payer: Self-pay | Admitting: Internal Medicine

## 2014-05-20 VITALS — BP 140/60 | HR 71 | Ht 67.0 in | Wt 145.3 lb

## 2014-05-20 DIAGNOSIS — R002 Palpitations: Secondary | ICD-10-CM

## 2014-05-20 DIAGNOSIS — I10 Essential (primary) hypertension: Secondary | ICD-10-CM

## 2014-05-20 NOTE — Patient Instructions (Signed)
Your physician wants you to follow-up in:  6 months. You will receive a reminder letter in the mail two months in advance. If you don't receive a letter, please call our office to schedule the follow-up appointment.   

## 2014-05-20 NOTE — Progress Notes (Signed)
OFFICE NOTE  Chief Complaint:  Rhinitis  Primary Care Physician: Lujean Amel, MD  HPI:  Frank Kramer is an 80 year old gentleman with history of mild coronary disease, hypertension, BPH, dyslipidemia. He has had some problems with GI bleeding. In fact, in January and February of this year he had C. difficile colitis and GI bleeding. Unfortunately, he has been grieving now with the loss of his wife about 6-8 weeks ago. She was a long-time patient of our practice as well. Overall, he has had some difficult-to-control hypertension. However, with recent changes in his medications, including an increase in Norvasc and addition of enalapril, his blood pressure is excellent, 110/62 today. He recently has had episodes of palpitations. He does notice them at home and once took his Lopressor which was elevated. His heart rate was reportedly over 200, however I am concerned about the accuracy of this. He did call 911 however refused to be transported to hospital. Based on this I recommended placement of a 2 week monitor and followup in our office. He wore a CardioNet monitor between 01/31/2014 and 02/13/2014. Heart rate ranged from 50 beats per minute to 128 beats per minute. There was no significant finding except for a 4 beat run of nonsustained VT. This occurred on 02/07/2014.  He's denied any chest pain or worsening shortness of breath.  Frank Kramer returns today for followup. He reports a marked improvement in his palpitations I increased the dose of his beta blocker. His blood pressure is now better controlled.  Overall is feeling well. His only complaints have to do with ongoing rhinitis. He is apparently on several different nasal sprays and has seen both an allergist and ENT without improvement in his symptoms.  PMHx:  Past Medical History  Diagnosis Date  . Arthritis   . Hypertension   . High cholesterol   . GERD (gastroesophageal reflux disease)   . Panic     panic disorder  . MGUS  (monoclonal gammopathy of unknown significance) 06/07/2012  . Rhinitis 06/07/2012  . Paget's bone disease   . Skin cancer   . Non Hodgkin's lymphoma dx'd 1997  . C. difficile colitis   . CAD (coronary artery disease) 02/08/2009    LHC: dilated ectatic segment in the midportion of the LAD with mild to mod. narrowing just distal to this.  Marland Kitchen Hx of lymphoma, non-Hodgkins 07/19/2013  . Basal cell carcinoma of face 07/20/2013    3 lesions excised Dr Link Snuffer approx  7/14    Past Surgical History  Procedure Laterality Date  . Back surgery    . Neck surgery      c2  . Skin biopsy    . Hernia repair    . Prostatectomy    . Colonoscopy N/A 12/15/2012    Procedure: COLONOSCOPY;  Surgeon: Missy Sabins, MD;  Location: [place];  Service: Endoscopy;  Laterality: N/A;  . Cardiac catheterization  02/08/2009    nonocclusive CAD    FAMHx:  Family History  Problem Relation Age of Onset  . Hypertension Mother   . Heart failure Mother   . Hypertension Father   . Cancer Brother   . Heart failure Sister     SOCHx:   reports that he quit smoking about 45 years ago. He has never used smokeless tobacco. He reports that he does not drink alcohol or use illicit drugs.  ALLERGIES:  Allergies  Allergen Reactions  . Aspirin     GI bleeding  . Ciprofloxacin Other (See  Comments)    Caused GI bleed  . Codeine Other (See Comments)    Patient cannot recall reaction  . Iohexol      Desc: HAS SEIZURES WITH X-RAY DYE-GIVEN 120 MG SOLU0MEDROL, 25 MG BENADRYL, AND 25 MG PEPCID 1 HOUR PRIOR TO EXAM AND HAD NO PROBLEMS-ARS-08/15/07   . Macrodantin Other (See Comments)    Patient cannot recall reaction  . Morphine And Related Itching    ROS: A comprehensive review of systems was negative except for: Ears, nose, mouth, throat, and face: positive for rhinitis  HOME MEDS: Current Outpatient Prescriptions  Medication Sig Dispense Refill  . alendronate (FOSAMAX) 70 MG tablet Take 70 mg by mouth every 7  (seven) days. Take with a full glass of water on an empty stomach. Takes on Saturdays.      . ALPRAZolam (XANAX) 0.5 MG tablet Take 1 tablet (0.5 mg total) by mouth 2 (two) times daily as needed. anxiety  20 tablet  0  . amLODipine (NORVASC) 2.5 MG tablet Take 2.5 mg by mouth daily.      . butalbital-acetaminophen-caffeine (FIORICET, ESGIC) 50-325-40 MG per tablet Take 1 tablet by mouth 2 (two) times daily as needed. For headaches.      . carvedilol (COREG) 25 MG tablet Take 1 tablet (25 mg total) by mouth 2 (two) times daily.  180 tablet  1  . cetirizine (ZYRTEC) 10 MG tablet Take 0.5 tablets (5 mg total) by mouth daily.  30 tablet  1  . cholecalciferol (VITAMIN D) 1000 UNITS tablet Take 1,000 Units by mouth daily.      . enalapril (VASOTEC) 10 MG tablet Take 5 mg by mouth daily.       . ferrous sulfate 325 (65 FE) MG tablet Take 325 mg by mouth daily.       . finasteride (PROSCAR) 5 MG tablet Take 5 mg by mouth daily.      . fluocinonide ointment (LIDEX) 2.23 % Apply 1 application topically 2 (two) times daily. rash      . ipratropium (ATROVENT) 0.03 % nasal spray Place 2 sprays into both nostrils 2 (two) times daily.      . mometasone (NASONEX) 50 MCG/ACT nasal spray Place 2 sprays into the nose 2 (two) times daily.      . polycarbophil (FIBERCON) 625 MG tablet Take 1 tablet (625 mg total) by mouth daily. Do not restart for 1 week.      . silodosin (RAPAFLO) 8 MG CAPS capsule Take 8 mg by mouth daily with breakfast.       No current facility-administered medications for this visit.    LABS/IMAGING: No results found for this or any previous visit (from the past 48 hour(s)). No results found.  VITALS: BP 140/60  Pulse 71  Ht 5\' 7"  (1.702 m)  Wt 145 lb 4.8 oz (65.908 kg)  BMI 22.75 kg/m2  EXAM: General appearance: alert and no distress Neck: no carotid bruit and no JVD Lungs: clear to auscultation bilaterally Heart: regular rate and rhythm, S1, S2 normal, no murmur, click, rub or  gallop Abdomen: soft, non-tender; bowel sounds normal; no masses,  no organomegaly Extremities: extremities normal, atraumatic, no cyanosis or edema Pulses: 2+ and symmetric Skin: Skin color, texture, turgor normal. No rashes or lesions Neurologic: Grossly normal Psych: Mood, affect normal  EKG: Normal sinus rhythm at 71  ASSESSMENT: 1. Palpitations- resolved 2. Mild, nonobstructive coronary disease by cath in 2010 3. Hypertension - controlled 4. Dyslipidemia  PLAN: 1.  Frank Kramer has had resolution of his palpitations I increased his beta blocker. His hypertension is now at goal. Overall he is doing well. I would recommend continuing his current medications and we'll see him back in 6 months.  Pixie Casino, MD, [facility] Attending Cardiologist [facility]  Lorann Tani C 05/20/2014, 2:58 PM

## 2014-07-18 ENCOUNTER — Encounter: Payer: Self-pay | Admitting: Hematology and Oncology

## 2014-07-18 ENCOUNTER — Other Ambulatory Visit (HOSPITAL_BASED_OUTPATIENT_CLINIC_OR_DEPARTMENT_OTHER): Payer: Medicare Other

## 2014-07-18 ENCOUNTER — Telehealth: Payer: Self-pay | Admitting: Hematology and Oncology

## 2014-07-18 ENCOUNTER — Ambulatory Visit (HOSPITAL_BASED_OUTPATIENT_CLINIC_OR_DEPARTMENT_OTHER): Payer: Medicare Other | Admitting: Hematology and Oncology

## 2014-07-18 VITALS — BP 135/57 | HR 89 | Temp 97.6°F | Resp 18 | Ht 67.0 in | Wt 144.1 lb

## 2014-07-18 DIAGNOSIS — M889 Osteitis deformans of unspecified bone: Secondary | ICD-10-CM

## 2014-07-18 DIAGNOSIS — M546 Pain in thoracic spine: Secondary | ICD-10-CM

## 2014-07-18 DIAGNOSIS — D696 Thrombocytopenia, unspecified: Secondary | ICD-10-CM

## 2014-07-18 DIAGNOSIS — D630 Anemia in neoplastic disease: Secondary | ICD-10-CM

## 2014-07-18 DIAGNOSIS — Z23 Encounter for immunization: Secondary | ICD-10-CM

## 2014-07-18 DIAGNOSIS — M549 Dorsalgia, unspecified: Secondary | ICD-10-CM

## 2014-07-18 DIAGNOSIS — K625 Hemorrhage of anus and rectum: Secondary | ICD-10-CM

## 2014-07-18 DIAGNOSIS — D472 Monoclonal gammopathy: Secondary | ICD-10-CM

## 2014-07-18 DIAGNOSIS — M1990 Unspecified osteoarthritis, unspecified site: Secondary | ICD-10-CM

## 2014-07-18 HISTORY — DX: Dorsalgia, unspecified: M54.9

## 2014-07-18 LAB — COMPREHENSIVE METABOLIC PANEL (CC13)
ALK PHOS: 68 U/L (ref 40–150)
ALT: 8 U/L (ref 0–55)
AST: 12 U/L (ref 5–34)
Albumin: 3.8 g/dL (ref 3.5–5.0)
Anion Gap: 8 mEq/L (ref 3–11)
BILIRUBIN TOTAL: 0.69 mg/dL (ref 0.20–1.20)
BUN: 13.1 mg/dL (ref 7.0–26.0)
CO2: 26 meq/L (ref 22–29)
Calcium: 9.5 mg/dL (ref 8.4–10.4)
Chloride: 109 mEq/L (ref 98–109)
Creatinine: 1.1 mg/dL (ref 0.7–1.3)
Glucose: 108 mg/dl (ref 70–140)
Potassium: 3.7 mEq/L (ref 3.5–5.1)
SODIUM: 143 meq/L (ref 136–145)
Total Protein: 6.9 g/dL (ref 6.4–8.3)

## 2014-07-18 LAB — CBC WITH DIFFERENTIAL/PLATELET
BASO%: 0.5 % (ref 0.0–2.0)
BASOS ABS: 0 10*3/uL (ref 0.0–0.1)
EOS ABS: 0.2 10*3/uL (ref 0.0–0.5)
EOS%: 3.8 % (ref 0.0–7.0)
HCT: 37.8 % — ABNORMAL LOW (ref 38.4–49.9)
HGB: 12.8 g/dL — ABNORMAL LOW (ref 13.0–17.1)
LYMPH%: 22.3 % (ref 14.0–49.0)
MCH: 33.3 pg (ref 27.2–33.4)
MCHC: 33.8 g/dL (ref 32.0–36.0)
MCV: 98.5 fL — AB (ref 79.3–98.0)
MONO#: 0.5 10*3/uL (ref 0.1–0.9)
MONO%: 10.7 % (ref 0.0–14.0)
NEUT%: 62.7 % (ref 39.0–75.0)
NEUTROS ABS: 2.7 10*3/uL (ref 1.5–6.5)
PLATELETS: 128 10*3/uL — AB (ref 140–400)
RBC: 3.84 10*6/uL — AB (ref 4.20–5.82)
RDW: 13.7 % (ref 11.0–14.6)
WBC: 4.3 10*3/uL (ref 4.0–10.3)
lymph#: 1 10*3/uL (ref 0.9–3.3)

## 2014-07-18 LAB — LACTATE DEHYDROGENASE (CC13): LDH: 164 U/L (ref 125–245)

## 2014-07-18 MED ORDER — LIDOCAINE 5 % EX PTCH: 1.0000 | MEDICATED_PATCH | CUTANEOUS | Status: AC

## 2014-07-18 MED ORDER — INFLUENZA VAC SPLIT QUAD 0.5 ML IM SUSY
0.5000 mL | PREFILLED_SYRINGE | Freq: Once | INTRAMUSCULAR | Status: AC
  Administered 2014-07-18: 0.5 mL via INTRAMUSCULAR
  Filled 2014-07-18: qty 0.5

## 2014-07-18 NOTE — Assessment & Plan Note (Signed)
The cause is unknown. It is mild and there is little change compared from previous platelet count. He is asymptomatic from the thrombocytopenia. I will observe for now.

## 2014-07-18 NOTE — Assessment & Plan Note (Signed)
Apparently, the patient had chronic rectal bleeding from hemorrhoids. He will continue on oral iron supplement daily.

## 2014-07-18 NOTE — Assessment & Plan Note (Signed)
The patient have chronic bone pain and arthritis. However, his back pain is severe and new onset. He has seen an orthopedic doctor will order some x-ray and was told it was just degenerative joint disease. I am concerned about possibility of compression fracture and possible new diagnosis of myeloma. Thankfully, he has no signs of neurological deficit. I will order MRI of the back for further evaluation. I have prescribed a Lidoderm patch in the meantime for pain control.

## 2014-07-18 NOTE — Telephone Encounter (Signed)
gv and printed appt schedand avs for pt for Sept °

## 2014-07-18 NOTE — Assessment & Plan Note (Signed)
Repeat light chains are pending. He has severe onset of bone pain and I am concerned about possible compression fracture. I will order an MRI for further review. I will see him back in 2 weeks to review test results.

## 2014-07-18 NOTE — Progress Notes (Signed)
[facility] FOLLOW-UP progress notes  Patient Care Team: Dibas Koirala, MD as PCP - General (Family Medicine)  CHIEF COMPLAINTS/PURPOSE OF VISIT:  History of non-Hodgkin lymphoma, MGUS, chronic anemia and thrombocytopenia  HISTORY OF PRESENTING ILLNESS:  Frank Kramer 80 y.o. male was transferred to my care after his prior physician has left.  I reviewed the patient's records extensive and collaborated the history with the patient. Summary of his history is as follows: This is a pleasant man with a remote history of high-grade non-Hodgkin's lymphoma diagnosed in September 1997. He was treated with CHOP chemotherapy prior to the Rituxan era and is likely cured. Further evaluation of normochromic anemia in August 2004 revealed that he had developed a kappa monoclonal gammopathy. This has been followed now for over 10 years and there have been no signs of progression to multiple myeloma. The patient had chronic rectal bleeding from hemorrhoids. He take iron sulfate regularly. He has severe degenerative arthritis. Several weeks ago, he had severe left knee pain and was seen by orthopedists. He has since improved. He has sudden onset of severe right scapular pain radiating down to his chest. It is graded at severe. He has taken some Vicodin at home. He felt that Lidoderm patches were helpful. He denies new neurological deficit. Denies recent trauma. MEDICAL HISTORY:  Past Medical History  Diagnosis Date  . Arthritis   . Hypertension   . High cholesterol   . GERD (gastroesophageal reflux disease)   . Panic     panic disorder  . MGUS (monoclonal gammopathy of unknown significance) 06/07/2012  . Rhinitis 06/07/2012  . Paget's bone disease   . Skin cancer   . Non Hodgkin's lymphoma dx'd 1997  . C. difficile colitis   . CAD (coronary artery disease) 02/08/2009    LHC: dilated ectatic segment in the midportion of the LAD with mild to mod. narrowing just distal to this.  Marland Kitchen Hx of  lymphoma, non-Hodgkins 07/19/2013  . Basal cell carcinoma of face 07/20/2013    3 lesions excised Dr Link Snuffer approx  7/14  . Back pain 07/18/2014    SURGICAL HISTORY: Past Surgical History  Procedure Laterality Date  . Back surgery    . Neck surgery      c2  . Skin biopsy    . Hernia repair    . Prostatectomy    . Colonoscopy N/A 12/15/2012    Procedure: COLONOSCOPY;  Surgeon: Missy Sabins, MD;  Location: [facility];  Service: Endoscopy;  Laterality: N/A;  . Cardiac catheterization  02/08/2009    nonocclusive CAD    SOCIAL HISTORY: History   Social History  . Marital Status: Married    Spouse Name: N/A    Number of Children: N/A  . Years of Education: N/A   Occupational History  . Not on file.   Social History Main Topics  . Smoking status: Former Smoker    Quit date: 11/20/1968  . Smokeless tobacco: Never Used  . Alcohol Use: No  . Drug Use: No  . Sexual Activity: No   Other Topics Concern  . Not on file   Social History Narrative  . No narrative on file    FAMILY HISTORY: Family History  Problem Relation Age of Onset  . Hypertension Mother   . Heart failure Mother   . Hypertension Father   . Cancer Brother   . Heart failure Sister     ALLERGIES:  is allergic to aspirin; ciprofloxacin; codeine; iohexol; macrodantin; and  morphine and related.  MEDICATIONS:  Current Outpatient Prescriptions  Medication Sig Dispense Refill  . alendronate (FOSAMAX) 70 MG tablet Take 70 mg by mouth every 7 (seven) days. Take with a full glass of water on an empty stomach. Takes on Saturdays.      . ALPRAZolam (XANAX) 0.5 MG tablet Take 1 tablet (0.5 mg total) by mouth 2 (two) times daily as needed. anxiety  20 tablet  0  . amLODipine (NORVASC) 2.5 MG tablet Take 2.5 mg by mouth daily.      . butalbital-acetaminophen-caffeine (FIORICET, ESGIC) 50-325-40 MG per tablet Take 1 tablet by mouth 2 (two) times daily as needed. For headaches.      . carvedilol (COREG) 25 MG tablet  Take 1 tablet (25 mg total) by mouth 2 (two) times daily.  180 tablet  1  . cetirizine (ZYRTEC) 10 MG tablet Take 0.5 tablets (5 mg total) by mouth daily.  30 tablet  1  . cholecalciferol (VITAMIN D) 1000 UNITS tablet Take 1,000 Units by mouth daily.      . enalapril (VASOTEC) 10 MG tablet Take 5 mg by mouth daily.       . ferrous sulfate 325 (65 FE) MG tablet Take 325 mg by mouth daily.       . finasteride (PROSCAR) 5 MG tablet Take 5 mg by mouth daily.      . fluocinonide ointment (LIDEX) 2.33 % Apply 1 application topically 2 (two) times daily. rash      . ipratropium (ATROVENT) 0.03 % nasal spray Place 2 sprays into both nostrils 2 (two) times daily.      . mometasone (NASONEX) 50 MCG/ACT nasal spray Place 2 sprays into the nose 2 (two) times daily.      . polycarbophil (FIBERCON) 625 MG tablet Take 1 tablet (625 mg total) by mouth daily. Do not restart for 1 week.      . silodosin (RAPAFLO) 8 MG CAPS capsule Take 8 mg by mouth daily with breakfast.      . lidocaine (LIDODERM) 5 % Place 1 patch onto the skin daily. Remove & Discard patch within 12 hours or as directed by MD  30 patch  0   No current facility-administered medications for this visit.    REVIEW OF SYSTEMS:   Constitutional: Denies fevers, chills or abnormal night sweats Eyes: Denies blurriness of vision, double vision or watery eyes Ears, nose, mouth, throat, and face: Denies mucositis or sore throat Respiratory: Denies cough, dyspnea or wheezes Cardiovascular: Denies palpitation, chest discomfort or lower extremity swelling Gastrointestinal:  Denies nausea, heartburn or change in bowel habits Skin: Denies abnormal skin rashes Lymphatics: Denies new lymphadenopathy or easy bruising Neurological:Denies numbness, tingling or new weaknesses Behavioral/Psych: Mood is stable, no new changes  All other systems were reviewed with the patient and are negative.  PHYSICAL EXAMINATION: ECOG PERFORMANCE STATUS: 1 - Symptomatic but  completely ambulatory  Filed Vitals:   07/18/14 1148  BP: 135/57  Pulse: 89  Temp: 97.6 F (36.4 C)  Resp: 18   Filed Weights   07/18/14 1148  Weight: 144 lb 1.6 oz (65.363 kg)    GENERAL:alert, no distress and comfortable SKIN: skin color, texture, turgor are normal, no rashes or significant lesions EYES: normal, conjunctiva are pink and non-injected, sclera clear OROPHARYNX:no exudate, normal lips, buccal mucosa, and tongue  NECK: supple, thyroid normal size, non-tender, without nodularity LYMPH:  no palpable lymphadenopathy in the cervical, axillary or inguinal LUNGS: clear to auscultation and percussion with  normal breathing effort HEART: regular rate & rhythm and no murmurs without lower extremity edema ABDOMEN:abdomen soft, non-tender and normal bowel sounds Musculoskeletal:no cyanosis of digits and no clubbing . Palpation of his chest wall did not elicit any tenderness PSYCH: alert & oriented x 3 with fluent speech NEURO: no focal motor/sensory deficits  LABORATORY DATA:  I have reviewed the data as listed Lab Results  Component Value Date   WBC 4.3 07/18/2014   HGB 12.8* 07/18/2014   HCT 37.8* 07/18/2014   MCV 98.5* 07/18/2014   PLT 128* 07/18/2014    Recent Labs  07/18/14 1130  NA 143  K 3.7  CO2 26  GLUCOSE 108  BUN 13.1  CREATININE 1.1  CALCIUM 9.5  PROT 6.9  ALBUMIN 3.8  AST 12  ALT 8  ALKPHOS 68  BILITOT 0.69  ASSESSMENT & PLAN:  MGUS (monoclonal gammopathy of unknown significance) Repeat light chains are pending. He has severe onset of bone pain and I am concerned about possible compression fracture. I will order an MRI for further review. I will see him back in 2 weeks to review test results.  Rectal bleeding Apparently, the patient had chronic rectal bleeding from hemorrhoids. He will continue on oral iron supplement daily.  Arthritis The patient have chronic bone pain and arthritis. However, his back pain is severe and new onset. He has  seen an orthopedic doctor will order some x-ray and was told it was just degenerative joint disease. I am concerned about possibility of compression fracture and possible new diagnosis of myeloma. Thankfully, he has no signs of neurological deficit. I will order MRI of the back for further evaluation. I have prescribed a Lidoderm patch in the meantime for pain control.  Anemia in neoplastic disease This is likely anemia of chronic disease. The patient denies recent history of bleeding such as epistaxis, hematuria or hematochezia. He is asymptomatic from the anemia. We will observe for now.  Thrombocytopenia, unspecified The cause is unknown. It is mild and there is little change compared from previous platelet count. He is asymptomatic from the thrombocytopenia. I will observe for now.    We discussed the importance of preventive care and reviewed the vaccination programs. He does not have any prior allergic reactions to influenza vaccination. He agrees to proceed with influenza vaccination today and we will administer it today at the clinic.   Orders Placed This Encounter  Procedures  . MR Thoracic Spine W Wo Contrast    144 LBS/NOT CLAUS/NOT ALLERGIC TO CONTRAST/LABS DRAWN 07/16/14/ BUN 14.4 CREAT 1.0/PT USES A CANE/INS/[employer] MEDICARE/[facility]-EPIC [phone]    Standing Status: Future     Number of Occurrences:      Standing Expiration Date: 09/18/2015    Order Specific Question:  GRA to provide read?    Answer:  Yes    Order Specific Question:  Reason for Exam (SYMPTOM  OR DIAGNOSIS REQUIRED)    Answer:  severe new back pain, exclude compression fracture or myeloma     Comments:  need open MRI    Order Specific Question:  Preferred imaging location?    Answer:  [id number]    Order Specific Question:  Does the patient have a pacemaker or implanted devices?    Answer:  No    Order Specific Question:  What is the patient's sedation requirement?    Answer:  No Sedation    All  questions were answered. The patient knows to call the clinic with any problems, questions or  concerns. I spent 30 minutes counseling the patient face to face. The total time spent in the appointment was 40 minutes and more than 50% was on counseling.     [facility], [place], MD 07/18/2014 12:47 PM

## 2014-07-18 NOTE — Assessment & Plan Note (Signed)
This is likely anemia of chronic disease. The patient denies recent history of bleeding such as epistaxis, hematuria or hematochezia. He is asymptomatic from the anemia. We will observe for now.  

## 2014-07-19 ENCOUNTER — Other Ambulatory Visit: Payer: Self-pay | Admitting: Hematology and Oncology

## 2014-07-19 DIAGNOSIS — D472 Monoclonal gammopathy: Secondary | ICD-10-CM

## 2014-07-20 LAB — KAPPA/LAMBDA LIGHT CHAINS
Kappa free light chain: 25.7 mg/dL — ABNORMAL HIGH (ref 0.33–1.94)
Kappa:Lambda Ratio: 21.07 — ABNORMAL HIGH (ref 0.26–1.65)
Lambda Free Lght Chn: 1.22 mg/dL (ref 0.57–2.63)

## 2014-07-20 LAB — IMMUNOFIXATION ELECTROPHORESIS
IGA: 161 mg/dL (ref 68–379)
IGM, SERUM: 53 mg/dL (ref 41–251)
IgG (Immunoglobin G), Serum: 1130 mg/dL (ref 650–1600)
TOTAL PROTEIN, SERUM ELECTROPHOR: 6.7 g/dL (ref 6.0–8.3)

## 2014-07-21 ENCOUNTER — Ambulatory Visit
Admission: RE | Admit: 2014-07-21 | Discharge: 2014-07-21 | Disposition: A | Discharge status: Home / Self Care | Payer: Medicare Other | Source: Ambulatory Visit | Attending: Hematology and Oncology | Admitting: Hematology and Oncology

## 2014-07-21 ENCOUNTER — Other Ambulatory Visit: Payer: Medicare Other

## 2014-07-21 DIAGNOSIS — D472 Monoclonal gammopathy: Secondary | ICD-10-CM

## 2014-07-26 ENCOUNTER — Encounter: Payer: Self-pay | Admitting: Hematology and Oncology

## 2014-07-26 ENCOUNTER — Other Ambulatory Visit: Payer: Medicare Other

## 2014-07-26 NOTE — Progress Notes (Signed)
[facility], [id number], approved lidocaine from 06/25/14-07/25/15

## 2014-08-02 ENCOUNTER — Telehealth: Payer: Self-pay | Admitting: *Deleted

## 2014-08-02 ENCOUNTER — Ambulatory Visit: Payer: Medicare Other | Admitting: Hematology and Oncology

## 2014-08-02 ENCOUNTER — Telehealth: Payer: Self-pay | Admitting: Hematology and Oncology

## 2014-08-02 NOTE — Telephone Encounter (Signed)
Daughter states pt unable to make his appt today due to the weather,  Causing him increase in pain and difficulty leaving the house.  Instructed dau will send request to Scheduler to R/s pt to next week.  She verbalized understanding.

## 2014-08-02 NOTE — Telephone Encounter (Signed)
, °

## 2014-08-03 ENCOUNTER — Telehealth: Payer: Self-pay | Admitting: Internal Medicine

## 2014-08-03 MED ORDER — CARVEDILOL 25 MG PO TABS: 25.0000 mg | ORAL_TABLET | Freq: Two times a day (BID) | ORAL | Status: DC

## 2014-08-03 NOTE — Telephone Encounter (Signed)
RN notified patient E-sent medication Need a recall appointment 6 monts from last appointment with Dr Debara Pickett

## 2014-08-03 NOTE — Telephone Encounter (Signed)
Pt called in saying that he is out of his Carvedilol and would like it called in to [facility]. He said that he would like a call letting him know that the medication had been called in  Thanks

## 2014-08-09 ENCOUNTER — Encounter: Payer: Self-pay | Admitting: Hematology and Oncology

## 2014-08-09 ENCOUNTER — Telehealth: Payer: Self-pay | Admitting: Hematology and Oncology

## 2014-08-09 ENCOUNTER — Ambulatory Visit (HOSPITAL_BASED_OUTPATIENT_CLINIC_OR_DEPARTMENT_OTHER): Payer: Medicare Other | Admitting: Hematology and Oncology

## 2014-08-09 VITALS — BP 132/62 | HR 84 | Temp 97.1°F | Resp 18 | Ht 67.0 in | Wt 142.7 lb

## 2014-08-09 DIAGNOSIS — D472 Monoclonal gammopathy: Secondary | ICD-10-CM

## 2014-08-09 DIAGNOSIS — J328 Other chronic sinusitis: Secondary | ICD-10-CM

## 2014-08-09 DIAGNOSIS — J329 Chronic sinusitis, unspecified: Secondary | ICD-10-CM | POA: Insufficient documentation

## 2014-08-09 DIAGNOSIS — M545 Low back pain, unspecified: Secondary | ICD-10-CM

## 2014-08-09 DIAGNOSIS — M889 Osteitis deformans of unspecified bone: Secondary | ICD-10-CM

## 2014-08-09 MED ORDER — AZITHROMYCIN 500 MG PO TABS: 500.0000 mg | ORAL_TABLET | Freq: Every day | ORAL | Status: DC

## 2014-08-09 NOTE — Telephone Encounter (Signed)
gv adn printed appt sched adn avs for pt for OCT 2016....lvm for Angel to call me and pt with appt...the patient is a former pt

## 2014-08-09 NOTE — Assessment & Plan Note (Signed)
Repeat blood test showed no evidence of progression to myeloma. I will see him on a yearly basis for this.

## 2014-08-09 NOTE — Assessment & Plan Note (Signed)
He has chronic nasal drainage with productive cough. He has failed Nasonex treatment. I recommend a course of antibiotics. If he does not improve by next week, he will call me and I would refer him to ENT surgery for further management and he agreed.

## 2014-08-09 NOTE — Assessment & Plan Note (Signed)
This is due to a compression fracture. I recommend neurosurgery consultation and he agreed. In the meantime, he will continue on conservative management for pain.

## 2014-08-09 NOTE — Progress Notes (Signed)
[facility] OFFICE PROGRESS NOTE  Patient Care Team: Dibas Koirala, MD as PCP - General (Family Medicine)  SUMMARY OF ONCOLOGIC HISTORY: This is a pleasant man with a remote history of high-grade non-Hodgkin's lymphoma diagnosed in September 1997. He was treated with CHOP chemotherapy prior to the Rituxan era and is likely cured. Further evaluation of normochromic anemia in August 2004 revealed that he had developed a kappa monoclonal gammopathy. This has been followed now for over 10 years and there have been no signs of progression to multiple myeloma. The patient had chronic rectal bleeding from hemorrhoids. He take iron sulfate regularly. He has severe degenerative arthritis. Several weeks ago, he had severe left knee pain and was seen by orthopedists. He has since improved. He has sudden onset of severe right scapular pain radiating down to his chest. It is graded at severe. He has taken some Vicodin at home. He felt that Lidoderm patches were helpful. He denies new neurological deficit. MRI of the back dated 07/21/2014 showed compression fracture  INTERVAL HISTORY: Please see below for problem oriented charting. He complained of persistent nasal drainage and productive cough. His severe back pain is still persistent. He denies new neurological deficits.  REVIEW OF SYSTEMS:   Constitutional: Denies fevers, chills or abnormal weight loss Eyes: Denies blurriness of vision Ears, nose, mouth, throat, and face: Denies mucositis or sore throat Cardiovascular: Denies palpitation, chest discomfort or lower extremity swelling Gastrointestinal:  Denies nausea, heartburn or change in bowel habits Skin: Denies abnormal skin rashes Lymphatics: Denies new lymphadenopathy or easy bruising Neurological:Denies numbness, tingling or new weaknesses Behavioral/Psych: Mood is stable, no new changes  All other systems were reviewed with the patient and are negative.  I have reviewed the  past medical history, past surgical history, social history and family history with the patient and they are unchanged from previous note.  ALLERGIES:  is allergic to aspirin; ciprofloxacin; codeine; iohexol; macrodantin; and morphine and related.  MEDICATIONS:  Current Outpatient Prescriptions  Medication Sig Dispense Refill  . alendronate (FOSAMAX) 70 MG tablet Take 70 mg by mouth every 7 (seven) days. Take with a full glass of water on an empty stomach. Takes on Saturdays.      . ALPRAZolam (XANAX) 0.5 MG tablet Take 1 tablet (0.5 mg total) by mouth 2 (two) times daily as needed. anxiety  20 tablet  0  . amLODipine (NORVASC) 2.5 MG tablet Take 2.5 mg by mouth daily.      . butalbital-acetaminophen-caffeine (FIORICET, ESGIC) 50-325-40 MG per tablet Take 1 tablet by mouth 2 (two) times daily as needed. For headaches.      . carvedilol (COREG) 25 MG tablet Take 1 tablet (25 mg total) by mouth 2 (two) times daily.  180 tablet  3  . cetirizine (ZYRTEC) 10 MG tablet Take 0.5 tablets (5 mg total) by mouth daily.  30 tablet  1  . cholecalciferol (VITAMIN D) 1000 UNITS tablet Take 1,000 Units by mouth daily.      . enalapril (VASOTEC) 10 MG tablet Take 5 mg by mouth daily.       . ferrous sulfate 325 (65 FE) MG tablet Take 325 mg by mouth daily.       . finasteride (PROSCAR) 5 MG tablet Take 5 mg by mouth daily.      . fluocinonide ointment (LIDEX) 0.81 % Apply 1 application topically 2 (two) times daily. rash      . ipratropium (ATROVENT) 0.03 % nasal spray Place 2 sprays  into both nostrils 2 (two) times daily.      Marland Kitchen lidocaine (LIDODERM) 5 % Place 1 patch onto the skin daily. Remove & Discard patch within 12 hours or as directed by MD  30 patch  0  . mometasone (NASONEX) 50 MCG/ACT nasal spray Place 2 sprays into the nose 2 (two) times daily.      . polycarbophil (FIBERCON) 625 MG tablet Take 1 tablet (625 mg total) by mouth daily. Do not restart for 1 week.      . silodosin (RAPAFLO) 8 MG CAPS  capsule Take 8 mg by mouth daily with breakfast.      . azithromycin (ZITHROMAX) 500 MG tablet Take 1 tablet (500 mg total) by mouth daily.  3 tablet  0   No current facility-administered medications for this visit.    PHYSICAL EXAMINATION: ECOG PERFORMANCE STATUS: 1 - Symptomatic but completely ambulatory  Filed Vitals:   08/09/14 1307  BP: 132/62  Pulse: 84  Temp: 97.1 F (36.2 C)  Resp: 18   Filed Weights   08/09/14 1307  Weight: 142 lb 11.2 oz (64.728 kg)    GENERAL:alert, no distress and comfortable SKIN: skin color, texture, turgor are normal, no rashes or significant lesions EYES: normal, Conjunctiva are pink and non-injected, sclera clear Musculoskeletal:no cyanosis of digits and no clubbing  NEURO: alert & oriented x 3 with fluent speech, no focal motor/sensory deficits  LABORATORY DATA:  I have reviewed the data as listed    Component Value Date/Time   NA 143 07/18/2014 1130   NA 141 12/15/2012 0540   K 3.7 07/18/2014 1130   K 3.5 12/15/2012 0540   CL 109* 01/12/2013 0952   CL 107 12/15/2012 0540   CO2 26 07/18/2014 1130   CO2 25 12/15/2012 0540   GLUCOSE 108 07/18/2014 1130   GLUCOSE 90 01/12/2013 0952   GLUCOSE 93 12/15/2012 0540   BUN 13.1 07/18/2014 1130   BUN 8 12/15/2012 0540   CREATININE 1.1 07/18/2014 1130   CREATININE 1.00 12/15/2012 0540   CALCIUM 9.5 07/18/2014 1130   CALCIUM 8.9 12/15/2012 0540   PROT 6.9 07/18/2014 1130   PROT 6.3 12/10/2012 1121   ALBUMIN 3.8 07/18/2014 1130   ALBUMIN 3.6 12/10/2012 1121   AST 12 07/18/2014 1130   AST 26 12/10/2012 1121   ALT 8 07/18/2014 1130   ALT 29 12/10/2012 1121   ALKPHOS 68 07/18/2014 1130   ALKPHOS 43 12/10/2012 1121   BILITOT 0.69 07/18/2014 1130   BILITOT 0.4 12/10/2012 1121   GFRNONAA 65* 12/15/2012 0540   GFRAA 75* 12/15/2012 0540    No results found for this basename: SPEP,  UPEP,   kappa and lambda light chains    Lab Results  Component Value Date   WBC 4.3 07/18/2014   NEUTROABS 2.7 07/18/2014   HGB 12.8*  07/18/2014   HCT 37.8* 07/18/2014   MCV 98.5* 07/18/2014   PLT 128* 07/18/2014      Chemistry      Component Value Date/Time   NA 143 07/18/2014 1130   NA 141 12/15/2012 0540   K 3.7 07/18/2014 1130   K 3.5 12/15/2012 0540   CL 109* 01/12/2013 0952   CL 107 12/15/2012 0540   CO2 26 07/18/2014 1130   CO2 25 12/15/2012 0540   BUN 13.1 07/18/2014 1130   BUN 8 12/15/2012 0540   CREATININE 1.1 07/18/2014 1130   CREATININE 1.00 12/15/2012 0540      Component Value Date/Time  CALCIUM 9.5 07/18/2014 1130   CALCIUM 8.9 12/15/2012 0540   ALKPHOS 68 07/18/2014 1130   ALKPHOS 43 12/10/2012 1121   AST 12 07/18/2014 1130   AST 26 12/10/2012 1121   ALT 8 07/18/2014 1130   ALT 29 12/10/2012 1121   BILITOT 0.69 07/18/2014 1130   BILITOT 0.4 12/10/2012 1121      ASSESSMENT & PLAN:  MGUS (monoclonal gammopathy of unknown significance) Repeat blood test showed no evidence of progression to myeloma. I will see him on a yearly basis for this.   Paget's bone disease This is chronic. It has caused compression fracture.  Midline low back pain without sciatica  This is due to a compression fracture. I recommend neurosurgery consultation and he agreed. In the meantime, he will continue on conservative management for pain.  Sinusitis He has chronic nasal drainage with productive cough. He has failed Nasonex treatment. I recommend a course of antibiotics. If he does not improve by next week, he will call me and I would refer him to ENT surgery for further management and he agreed.    Orders Placed This Encounter  Procedures  . CBC with Differential    Standing Status: Future     Number of Occurrences:      Standing Expiration Date: 09/13/2015  . Comprehensive metabolic panel    Standing Status: Future     Number of Occurrences:      Standing Expiration Date: 09/13/2015  . Lactate dehydrogenase    Standing Status: Future     Number of Occurrences:      Standing Expiration Date: 09/13/2015  . SPEP & IFE with  QIG    Standing Status: Future     Number of Occurrences:      Standing Expiration Date: 09/13/2015  . Kappa/lambda light chains    Standing Status: Future     Number of Occurrences:      Standing Expiration Date: 09/13/2015  . Beta 2 microglobulin, serum    Standing Status: Future     Number of Occurrences:      Standing Expiration Date: 09/13/2015  . Ambulatory referral to Neurosurgery    Referral Priority:  Routine    Referral Type:  Surgical    Referral Reason:  Specialty Services Required    Referred to Provider:  Hosie Spangle, MD    Requested Specialty:  Neurosurgery    Number of Visits Requested:  1   All questions were answered. The patient knows to call the clinic with any problems, questions or concerns. No barriers to learning was detected. I spent 25 minutes counseling the patient face to face. The total time spent in the appointment was 30 minutes and more than 50% was on counseling and review of test results     [facility], [place], MD 08/09/2014 8:48 PM

## 2014-08-09 NOTE — Assessment & Plan Note (Signed)
This is chronic. It has caused compression fracture.

## 2014-11-09 ENCOUNTER — Encounter: Payer: Self-pay | Admitting: Internal Medicine

## 2014-11-09 ENCOUNTER — Ambulatory Visit (INDEPENDENT_AMBULATORY_CARE_PROVIDER_SITE_OTHER): Payer: Medicare Other | Admitting: Internal Medicine

## 2014-11-09 VITALS — BP 118/50 | HR 77 | Ht 67.0 in | Wt 147.8 lb

## 2014-11-09 DIAGNOSIS — R002 Palpitations: Secondary | ICD-10-CM

## 2014-11-09 DIAGNOSIS — I10 Essential (primary) hypertension: Secondary | ICD-10-CM

## 2014-11-09 NOTE — Patient Instructions (Signed)
Your physician wants you to follow-up in: 6 months with Dr. Hilty. You will receive a reminder letter in the mail two months in advance. If you don't receive a letter, please call our office to schedule the follow-up appointment.    

## 2014-11-09 NOTE — Progress Notes (Signed)
OFFICE NOTE  Chief Complaint:  Back pain  Primary Care Physician: Lujean Amel, MD  HPI:  Frank Kramer is an 81 year old gentleman with history of mild coronary disease, hypertension, BPH, dyslipidemia. He has had some problems with GI bleeding. In fact, in January and February of this year he had C. difficile colitis and GI bleeding. Unfortunately, he has been grieving now with the loss of his wife about 6-8 weeks ago. She was a long-time patient of our practice as well. Overall, he has had some difficult-to-control hypertension. However, with recent changes in his medications, including an increase in Norvasc and addition of enalapril, his blood pressure is excellent, 110/62 today. He recently has had episodes of palpitations. He does notice them at home and once took his Lopressor which was elevated. His heart rate was reportedly over 200, however I am concerned about the accuracy of this. He did call 911 however refused to be transported to hospital. Based on this I recommended placement of a 2 week monitor and followup in our office. He wore a CardioNet monitor between 01/31/2014 and 02/13/2014. Heart rate ranged from 50 beats per minute to 128 beats per minute. There was no significant finding except for a 4 beat run of nonsustained VT. This occurred on 02/07/2014.  He's denied any chest pain or worsening shortness of breath.  Frank Kramer returns today for followup. He reports a marked improvement in his palpitations I increased the dose of his beta blocker. His blood pressure is now better controlled.  Overall is feeling well. His complaints of back pain today but otherwise is doing pretty well.  PMHx:  Past Medical History  Diagnosis Date  . Arthritis   . Hypertension   . High cholesterol   . GERD (gastroesophageal reflux disease)   . Panic     panic disorder  . MGUS (monoclonal gammopathy of unknown significance) 06/07/2012  . Rhinitis 06/07/2012  . Paget's bone disease   . Skin  cancer   . Non Hodgkin's lymphoma dx'd 1997  . C. difficile colitis   . CAD (coronary artery disease) 02/08/2009    LHC: dilated ectatic segment in the midportion of the LAD with mild to mod. narrowing just distal to this.  Marland Kitchen Hx of lymphoma, non-Hodgkins 07/19/2013  . Basal cell carcinoma of face 07/20/2013    3 lesions excised Dr Link Snuffer approx  7/14  . Back pain 07/18/2014    Past Surgical History  Procedure Laterality Date  . Back surgery    . Neck surgery      c2  . Skin biopsy    . Hernia repair    . Prostatectomy    . Colonoscopy N/A 12/15/2012    Procedure: COLONOSCOPY;  Surgeon: Missy Sabins, MD;  Location: [place];  Service: Endoscopy;  Laterality: N/A;  . Cardiac catheterization  02/08/2009    nonocclusive CAD    FAMHx:  Family History  Problem Relation Age of Onset  . Hypertension Mother   . Heart failure Mother   . Hypertension Father   . Cancer Brother   . Heart failure Sister     SOCHx:   reports that he quit smoking about 46 years ago. He has never used smokeless tobacco. He reports that he does not drink alcohol or use illicit drugs.  ALLERGIES:  Allergies  Allergen Reactions  . Aspirin     GI bleeding  . Ciprofloxacin Other (See Comments)    Caused GI bleed  . Codeine Other (See  Comments)    Patient cannot recall reaction  . Iohexol      Desc: HAS SEIZURES WITH X-RAY DYE-GIVEN 120 MG SOLU0MEDROL, 25 MG BENADRYL, AND 25 MG PEPCID 1 HOUR PRIOR TO EXAM AND HAD NO PROBLEMS-ARS-08/15/07   . Macrodantin Other (See Comments)    Patient cannot recall reaction  . Morphine And Related Itching    ROS: A comprehensive review of systems was negative except for: Musculoskeletal: positive for back pain  HOME MEDS: Current Outpatient Prescriptions  Medication Sig Dispense Refill  . ALPRAZolam (XANAX) 0.5 MG tablet Take 1 tablet (0.5 mg total) by mouth 2 (two) times daily as needed. anxiety 20 tablet 0  . amLODipine (NORVASC) 5 MG tablet Take 1 tablet by  mouth daily.    Marland Kitchen azithromycin (ZITHROMAX) 500 MG tablet Take 1 tablet (500 mg total) by mouth daily. 3 tablet 0  . butalbital-acetaminophen-caffeine (FIORICET, ESGIC) 50-325-40 MG per tablet Take 1 tablet by mouth 2 (two) times daily as needed. For headaches.    . carvedilol (COREG) 25 MG tablet Take 1 tablet (25 mg total) by mouth 2 (two) times daily. 180 tablet 3  . cetirizine (ZYRTEC) 10 MG tablet Take 0.5 tablets (5 mg total) by mouth daily. 30 tablet 1  . cholecalciferol (VITAMIN D) 1000 UNITS tablet Take 1,000 Units by mouth daily.    . enalapril (VASOTEC) 10 MG tablet Take 5 mg by mouth daily.     . ferrous sulfate 325 (65 FE) MG tablet Take 325 mg by mouth daily.     . finasteride (PROSCAR) 5 MG tablet Take 5 mg by mouth daily.    . fluocinonide ointment (LIDEX) 8.85 % Apply 1 application topically 2 (two) times daily. rash    . lidocaine (LIDODERM) 5 % Place 1 patch onto the skin daily. Remove & Discard patch within 12 hours or as directed by MD 30 patch 0  . polycarbophil (FIBERCON) 625 MG tablet Take 1 tablet (625 mg total) by mouth daily. Do not restart for 1 week.    . silodosin (RAPAFLO) 8 MG CAPS capsule Take 8 mg by mouth daily with breakfast.     No current facility-administered medications for this visit.    LABS/IMAGING: No results found for this or any previous visit (from the past 48 hour(s)). No results found.  VITALS: BP 118/50 mmHg  Pulse 77  Ht 5\' 7"  (1.702 m)  Wt 147 lb 12.8 oz (67.042 kg)  BMI 23.14 kg/m2  EXAM: General appearance: alert and no distress Neck: no carotid bruit and no JVD Lungs: clear to auscultation bilaterally Heart: regular rate and rhythm, S1, S2 normal, no murmur, click, rub or gallop Abdomen: soft, non-tender; bowel sounds normal; no masses,  no organomegaly Extremities: extremities normal, atraumatic, no cyanosis or edema Pulses: 2+ and symmetric Skin: Skin color, texture, turgor normal. No rashes or lesions Neurologic: Grossly  normal Psych: Mood, affect normal  EKG: Normal sinus rhythm at 77  ASSESSMENT: 1. Palpitations- resolved 2. Mild, nonobstructive coronary disease by cath in 2010 3. Hypertension - controlled 4. Dyslipidemia  PLAN: 1.   Mr. Scarantino has had resolution of his palpitations I increased his beta blocker. His hypertension is now at goal. Overall he is doing well. I would recommend continuing his current medications and we'll see him back in 6 months.  Pixie Casino, MD, [facility] Attending Cardiologist [facility]  Shauntel Prest C 11/09/2014, 5:14 PM

## 2015-05-10 ENCOUNTER — Encounter: Payer: Self-pay | Admitting: Internal Medicine

## 2015-05-10 ENCOUNTER — Telehealth: Payer: Self-pay | Admitting: Hematology and Oncology

## 2015-05-10 ENCOUNTER — Ambulatory Visit (INDEPENDENT_AMBULATORY_CARE_PROVIDER_SITE_OTHER): Payer: Medicare Other | Admitting: Internal Medicine

## 2015-05-10 VITALS — BP 124/62 | HR 61 | Ht 67.0 in | Wt 145.0 lb

## 2015-05-10 DIAGNOSIS — E785 Hyperlipidemia, unspecified: Secondary | ICD-10-CM | POA: Diagnosis not present

## 2015-05-10 DIAGNOSIS — R002 Palpitations: Secondary | ICD-10-CM | POA: Diagnosis not present

## 2015-05-10 DIAGNOSIS — I10 Essential (primary) hypertension: Secondary | ICD-10-CM | POA: Diagnosis not present

## 2015-05-10 MED ORDER — CARVEDILOL 25 MG PO TABS: 25.0000 mg | ORAL_TABLET | Freq: Two times a day (BID) | ORAL | Status: DC

## 2015-05-10 NOTE — Patient Instructions (Signed)
Your physician wants you to follow-up in: 6 months with Dr. Hilty. You will receive a reminder letter in the mail two months in advance. If you don't receive a letter, please call our office to schedule the follow-up appointment.    

## 2015-05-10 NOTE — Telephone Encounter (Signed)
returned call and confirmed appt.....pt ok and aware °

## 2015-05-10 NOTE — Progress Notes (Signed)
OFFICE NOTE  Chief Complaint:  No complaints  Primary Care Physician: Lujean Amel, MD  HPI:  Frank Kramer is an 81 year old gentleman with history of mild coronary disease, hypertension, BPH, dyslipidemia. He has had some problems with GI bleeding. In fact, in January and February of this year he had C. difficile colitis and GI bleeding. Unfortunately, he has been grieving now with the loss of his wife about 6-8 weeks ago. She was a long-time patient of our practice as well. Overall, he has had some difficult-to-control hypertension. However, with recent changes in his medications, including an increase in Norvasc and addition of enalapril, his blood pressure is excellent, 110/62 today. He recently has had episodes of palpitations. He does notice them at home and once took his Lopressor which was elevated. His heart rate was reportedly over 200, however I am concerned about the accuracy of this. He did call 911 however refused to be transported to hospital. Based on this I recommended placement of a 2 week monitor and followup in our office. He wore a CardioNet monitor between 01/31/2014 and 02/13/2014. Heart rate ranged from 50 beats per minute to 128 beats per minute. There was no significant finding except for a 4 beat run of nonsustained VT. This occurred on 02/07/2014.  He's denied any chest pain or worsening shortness of breath.  Frank Kramer returns today for followup. He reports a marked improvement in his palpitations I increased the dose of his beta blocker. His blood pressure is now better controlled.  Overall is feeling well. His complaints of back pain today but otherwise is doing pretty well.  I had the pleasure of seeing Frank Kramer back in the office today. Overall he is doing really well. He denies any chest pain and has very infrequent palpitations. He had one episode of what he thought was heartburn were seem to respond to tums after about 30 minutes. He's had no other exertional  chest pain. He does have some mild EKG changes including inferior T-wave inversions but does have voltage criteria for LVH.  PMHx:  Past Medical History  Diagnosis Date  . Arthritis   . Hypertension   . High cholesterol   . GERD (gastroesophageal reflux disease)   . Panic     panic disorder  . MGUS (monoclonal gammopathy of unknown significance) 06/07/2012  . Rhinitis 06/07/2012  . Paget's bone disease   . Skin cancer   . Non Hodgkin's lymphoma dx'd 1997  . C. difficile colitis   . CAD (coronary artery disease) 02/08/2009    LHC: dilated ectatic segment in the midportion of the LAD with mild to mod. narrowing just distal to this.  Marland Kitchen Hx of lymphoma, non-Hodgkins 07/19/2013  . Basal cell carcinoma of face 07/20/2013    3 lesions excised Dr Link Snuffer approx  7/14  . Back pain 07/18/2014    Past Surgical History  Procedure Laterality Date  . Back surgery    . Neck surgery      c2  . Skin biopsy    . Hernia repair    . Prostatectomy    . Colonoscopy N/A 12/15/2012    Procedure: COLONOSCOPY;  Surgeon: Missy Sabins, MD;  Location: [place];  Service: Endoscopy;  Laterality: N/A;  . Cardiac catheterization  02/08/2009    nonocclusive CAD    FAMHx:  Family History  Problem Relation Age of Onset  . Hypertension Mother   . Heart failure Mother   . Hypertension Father   .  Cancer Brother   . Heart failure Sister     SOCHx:   reports that he quit smoking about 46 years ago. He has never used smokeless tobacco. He reports that he does not drink alcohol or use illicit drugs.  ALLERGIES:  Allergies  Allergen Reactions  . Aspirin     GI bleeding  . Ciprofloxacin Other (See Comments)    Caused GI bleed  . Codeine Other (See Comments)    Patient cannot recall reaction  . Iohexol      Desc: HAS SEIZURES WITH X-RAY DYE-GIVEN 120 MG SOLU0MEDROL, 25 MG BENADRYL, AND 25 MG PEPCID 1 HOUR PRIOR TO EXAM AND HAD NO PROBLEMS-ARS-08/15/07   . Macrodantin Other (See Comments)    Patient  cannot recall reaction  . Morphine And Related Itching    ROS: A comprehensive review of systems was negative.  HOME MEDS: Current Outpatient Prescriptions  Medication Sig Dispense Refill  . ALPRAZolam (XANAX) 0.5 MG tablet Take 1 tablet (0.5 mg total) by mouth 2 (two) times daily as needed. anxiety 20 tablet 0  . amLODipine (NORVASC) 5 MG tablet Take 1 tablet by mouth daily.    Marland Kitchen azithromycin (ZITHROMAX) 500 MG tablet Take 1 tablet (500 mg total) by mouth daily. 3 tablet 0  . butalbital-acetaminophen-caffeine (FIORICET, ESGIC) 50-325-40 MG per tablet Take 1 tablet by mouth 2 (two) times daily as needed. For headaches.    . carvedilol (COREG) 25 MG tablet Take 1 tablet (25 mg total) by mouth 2 (two) times daily. 180 tablet 3  . cetirizine (ZYRTEC) 10 MG tablet Take 0.5 tablets (5 mg total) by mouth daily. 30 tablet 1  . cholecalciferol (VITAMIN D) 1000 UNITS tablet Take 1,000 Units by mouth daily.    . enalapril (VASOTEC) 10 MG tablet Take 5 mg by mouth daily.     . ferrous sulfate 325 (65 FE) MG tablet Take 325 mg by mouth daily.     . fluocinonide ointment (LIDEX) 5.05 % Apply 1 application topically 2 (two) times daily. rash    . lidocaine (LIDODERM) 5 % Place 1 patch onto the skin daily. Remove & Discard patch within 12 hours or as directed by MD 30 patch 0  . polycarbophil (FIBERCON) 625 MG tablet Take 1 tablet (625 mg total) by mouth daily. Do not restart for 1 week.    . silodosin (RAPAFLO) 8 MG CAPS capsule Take 8 mg by mouth daily with breakfast.    . finasteride (PROSCAR) 5 MG tablet Take 5 mg by mouth daily.    Marland Kitchen ipratropium (ATROVENT) 0.06 % nasal spray Place 2 sprays into both nostrils 3 (three) times daily.      No current facility-administered medications for this visit.    LABS/IMAGING: No results found for this or any previous visit (from the past 48 hour(s)). No results found.  VITALS: BP 124/62 mmHg  Pulse 61  Ht 5\' 7"  (1.702 m)  Wt 145 lb (65.772 kg)  BMI  22.71 kg/m2  EXAM: General appearance: alert and no distress Neck: no carotid bruit and no JVD Lungs: clear to auscultation bilaterally Heart: regular rate and rhythm, S1, S2 normal, no murmur, click, rub or gallop Abdomen: soft, non-tender; bowel sounds normal; no masses,  no organomegaly Extremities: extremities normal, atraumatic, no cyanosis or edema Pulses: 2+ and symmetric Skin: Skin color, texture, turgor normal. No rashes or lesions Neurologic: Grossly normal Psych: Mood, affect normal  EKG: Normal sinus rhythm at 61, LVH with inferior T-wave inversions secondary to  repolarization change  ASSESSMENT: 1. Palpitations- resolved 2. Mild, nonobstructive coronary disease by cath in 2010 3. Hypertension - controlled 4. Dyslipidemia  PLAN: 1.   Frank Kramer has had resolution of his palpitations. His hypertension is now at goal. He denies any cardiac chest pain. Cholesterol is followed by his primary care provider. Overall he is doing well. I would recommend continuing his current medications and we'll see him back in 6 months.  Pixie Casino, MD, [facility] Attending Cardiologist [place] C H[place] 05/10/2015, 3:54 PM

## 2015-08-08 ENCOUNTER — Other Ambulatory Visit (HOSPITAL_BASED_OUTPATIENT_CLINIC_OR_DEPARTMENT_OTHER): Payer: Medicare Other

## 2015-08-08 DIAGNOSIS — D472 Monoclonal gammopathy: Secondary | ICD-10-CM

## 2015-08-08 LAB — CBC WITH DIFFERENTIAL/PLATELET
BASO%: 0.6 % (ref 0.0–2.0)
Basophils Absolute: 0 10*3/uL (ref 0.0–0.1)
EOS ABS: 0 10*3/uL (ref 0.0–0.5)
EOS%: 1.2 % (ref 0.0–7.0)
HCT: 39 % (ref 38.4–49.9)
HGB: 13.3 g/dL (ref 13.0–17.1)
LYMPH%: 22.7 % (ref 14.0–49.0)
MCH: 33.1 pg (ref 27.2–33.4)
MCHC: 34.1 g/dL (ref 32.0–36.0)
MCV: 97.1 fL (ref 79.3–98.0)
MONO#: 0.5 10*3/uL (ref 0.1–0.9)
MONO%: 11.2 % (ref 0.0–14.0)
NEUT%: 64.3 % (ref 39.0–75.0)
NEUTROS ABS: 2.7 10*3/uL (ref 1.5–6.5)
PLATELETS: 135 10*3/uL — AB (ref 140–400)
RBC: 4.02 10*6/uL — AB (ref 4.20–5.82)
RDW: 13.7 % (ref 11.0–14.6)
WBC: 4.2 10*3/uL (ref 4.0–10.3)
lymph#: 1 10*3/uL (ref 0.9–3.3)

## 2015-08-08 LAB — COMPREHENSIVE METABOLIC PANEL (CC13)
ALT: 18 U/L (ref 0–55)
ANION GAP: 6 meq/L (ref 3–11)
AST: 14 U/L (ref 5–34)
Albumin: 3.7 g/dL (ref 3.5–5.0)
Alkaline Phosphatase: 65 U/L (ref 40–150)
BILIRUBIN TOTAL: 0.73 mg/dL (ref 0.20–1.20)
BUN: 14.4 mg/dL (ref 7.0–26.0)
CHLORIDE: 107 meq/L (ref 98–109)
CO2: 28 mEq/L (ref 22–29)
Calcium: 9.5 mg/dL (ref 8.4–10.4)
Creatinine: 1.1 mg/dL (ref 0.7–1.3)
EGFR: 57 mL/min/{1.73_m2} — AB (ref >90)
GLUCOSE: 112 mg/dL (ref 70–140)
Potassium: 3.5 mEq/L (ref 3.5–5.1)
SODIUM: 141 meq/L (ref 136–145)
TOTAL PROTEIN: 6.6 g/dL (ref 6.4–8.3)

## 2015-08-08 LAB — LACTATE DEHYDROGENASE (CC13): LDH: 135 U/L (ref 125–245)

## 2015-08-10 LAB — SPEP & IFE WITH QIG
ALPHA-2-GLOBULIN: 0.7 g/dL (ref 0.5–0.9)
Abnormal Protein Band1: 0.4 g/dL
Albumin ELP: 3.9 g/dL (ref 3.8–4.8)
Alpha-1-Globulin: 0.3 g/dL (ref 0.2–0.3)
Beta 2: 0.3 g/dL (ref 0.2–0.5)
Beta Globulin: 0.3 g/dL — ABNORMAL LOW (ref 0.4–0.6)
Gamma Globulin: 1.1 g/dL (ref 0.8–1.7)
IGM, SERUM: 62 mg/dL (ref 41–251)
IgA: 161 mg/dL (ref 68–379)
IgG (Immunoglobin G), Serum: 1220 mg/dL (ref 650–1600)
Total Protein, Serum Electrophoresis: 6.5 g/dL (ref 6.1–8.1)

## 2015-08-10 LAB — KAPPA/LAMBDA LIGHT CHAINS
KAPPA FREE LGHT CHN: 24.3 mg/dL — AB (ref 0.33–1.94)
KAPPA LAMBDA RATIO: 15.68 — AB (ref 0.26–1.65)
LAMBDA FREE LGHT CHN: 1.55 mg/dL (ref 0.57–2.63)

## 2015-08-15 ENCOUNTER — Telehealth: Payer: Self-pay | Admitting: Hematology and Oncology

## 2015-08-15 ENCOUNTER — Ambulatory Visit (HOSPITAL_BASED_OUTPATIENT_CLINIC_OR_DEPARTMENT_OTHER): Payer: Medicare Other | Admitting: Hematology and Oncology

## 2015-08-15 ENCOUNTER — Encounter: Payer: Self-pay | Admitting: Hematology and Oncology

## 2015-08-15 VITALS — BP 141/61 | HR 81 | Temp 97.6°F | Resp 20 | Ht 67.0 in | Wt 142.9 lb

## 2015-08-15 DIAGNOSIS — M549 Dorsalgia, unspecified: Secondary | ICD-10-CM

## 2015-08-15 DIAGNOSIS — D472 Monoclonal gammopathy: Secondary | ICD-10-CM | POA: Diagnosis not present

## 2015-08-15 DIAGNOSIS — G8929 Other chronic pain: Secondary | ICD-10-CM

## 2015-08-15 DIAGNOSIS — D696 Thrombocytopenia, unspecified: Secondary | ICD-10-CM | POA: Diagnosis not present

## 2015-08-15 DIAGNOSIS — M5489 Other dorsalgia: Secondary | ICD-10-CM

## 2015-08-15 NOTE — Telephone Encounter (Signed)
Gave adn printed appt sched and avs for pt for [id number]

## 2015-08-15 NOTE — Assessment & Plan Note (Signed)
The cause is unknown. It is mild and there is little change compared from previous platelet count. He is asymptomatic from the thrombocytopenia. I will observe for now.

## 2015-08-15 NOTE — Assessment & Plan Note (Signed)
Repeat blood test showed no evidence of progression to myeloma. I will see him on a yearly basis for this.

## 2015-08-15 NOTE — Assessment & Plan Note (Signed)
This is chronic. It has caused compression fracture. Continue conservative management. Recommend he continues vitamin D supplement, lidocaine patch and heat pad as needed

## 2015-08-15 NOTE — Progress Notes (Signed)
[place] OFFICE PROGRESS NOTE  Patient Care Team: Dibas Koirala, MD as PCP - General (Family Medicine)  SUMMARY OF ONCOLOGIC HISTORY:  This is a pleasant man with a remote history of high-grade non-Hodgkin's lymphoma diagnosed in September 1997. He was treated with CHOP chemotherapy prior to the Rituxan era and is likely cured. Further evaluation of normochromic anemia in August 2004 revealed that he had developed a kappa monoclonal gammopathy. This has been followed now for over 10 years and there have been no signs of progression to multiple myeloma. The patient had chronic rectal bleeding from hemorrhoids. He take iron sulfate regularly. He has severe degenerative arthritis. Several weeks ago, he had severe left knee pain and was seen by orthopedists. He has since improved. He has sudden onset of severe right scapular pain radiating down to his chest. It is graded at severe. He has taken some Vicodin at home. He felt that Lidoderm patches were helpful. He denies new neurological deficit. MRI of the back dated 07/21/2014 showed compression fracture.  He was managed conservatively  INTERVAL HISTORY: Please see below for problem oriented charting.  he continues to have chronic back pain. He is not debilitating. He denies recent infection. Denies new bone fractures.  REVIEW OF SYSTEMS:   Constitutional: Denies fevers, chills or abnormal weight loss Eyes: Denies blurriness of vision Ears, nose, mouth, throat, and face: Denies mucositis or sore throat Respiratory: Denies cough, dyspnea or wheezes Cardiovascular: Denies palpitation, chest discomfort or lower extremity swelling Gastrointestinal:  Denies nausea, heartburn or change in bowel habits Skin: Denies abnormal skin rashes Lymphatics: Denies new lymphadenopathy or easy bruising Neurological:Denies numbness, tingling or new weaknesses Behavioral/Psych: Mood is stable, no new changes  All other systems were reviewed  with the patient and are negative.  I have reviewed the past medical history, past surgical history, social history and family history with the patient and they are unchanged from previous note.  ALLERGIES:  is allergic to aspirin; ciprofloxacin; codeine; iohexol; macrodantin; and morphine and related.  MEDICATIONS:  Current Outpatient Prescriptions  Medication Sig Dispense Refill  . ALPRAZolam (XANAX) 0.5 MG tablet Take 1 tablet (0.5 mg total) by mouth 2 (two) times daily as needed. anxiety 20 tablet 0  . amLODipine (NORVASC) 5 MG tablet Take 1 tablet by mouth daily.    Marland Kitchen azithromycin (ZITHROMAX) 500 MG tablet Take 1 tablet (500 mg total) by mouth daily. 3 tablet 0  . butalbital-acetaminophen-caffeine (FIORICET, ESGIC) 50-325-40 MG per tablet Take 1 tablet by mouth 2 (two) times daily as needed. For headaches.    . carvedilol (COREG) 25 MG tablet Take 1 tablet (25 mg total) by mouth 2 (two) times daily. 180 tablet 3  . cetirizine (ZYRTEC) 10 MG tablet Take 0.5 tablets (5 mg total) by mouth daily. 30 tablet 1  . cholecalciferol (VITAMIN D) 1000 UNITS tablet Take 1,000 Units by mouth daily.    . enalapril (VASOTEC) 10 MG tablet Take 5 mg by mouth daily.     . ferrous sulfate 325 (65 FE) MG tablet Take 325 mg by mouth daily.     . finasteride (PROSCAR) 5 MG tablet Take 5 mg by mouth daily.    . fluocinonide ointment (LIDEX) 1.22 % Apply 1 application topically 2 (two) times daily. rash    . ipratropium (ATROVENT) 0.06 % nasal spray Place 2 sprays into both nostrils 3 (three) times daily.     Marland Kitchen lidocaine (LIDODERM) 5 % Place 1 patch onto the skin daily. Remove &  Discard patch within 12 hours or as directed by MD 30 patch 0  . polycarbophil (FIBERCON) 625 MG tablet Take 1 tablet (625 mg total) by mouth daily. Do not restart for 1 week.    . silodosin (RAPAFLO) 8 MG CAPS capsule Take 8 mg by mouth daily with breakfast.     No current facility-administered medications for this visit.     PHYSICAL EXAMINATION: ECOG PERFORMANCE STATUS: 1 - Symptomatic but completely ambulatory  Filed Vitals:   08/15/15 1233  BP: 141/61  Pulse: 81  Temp: 97.6 F (36.4 C)  Resp: 20   Filed Weights   08/15/15 1233  Weight: 142 lb 14.4 oz (64.819 kg)    GENERAL:alert, no distress and comfortable SKIN: skin color, texture, turgor are normal, no rashes or significant lesions EYES: normal, Conjunctiva are pink and non-injected, sclera clear Musculoskeletal:no cyanosis of digits and no clubbing  NEURO: alert & oriented x 3 with fluent speech, no focal motor/sensory deficits  LABORATORY DATA:  I have reviewed the data as listed    Component Value Date/Time   NA 141 08/08/2015 1231   NA 141 12/15/2012 0540   K 3.5 08/08/2015 1231   K 3.5 12/15/2012 0540   CL 109* 01/12/2013 0952   CL 107 12/15/2012 0540   CO2 28 08/08/2015 1231   CO2 25 12/15/2012 0540   GLUCOSE 112 08/08/2015 1231   GLUCOSE 90 01/12/2013 0952   GLUCOSE 93 12/15/2012 0540   BUN 14.4 08/08/2015 1231   BUN 8 12/15/2012 0540   CREATININE 1.1 08/08/2015 1231   CREATININE 1.00 12/15/2012 0540   CALCIUM 9.5 08/08/2015 1231   CALCIUM 8.9 12/15/2012 0540   PROT 6.6 08/08/2015 1231   PROT 6.3 12/10/2012 1121   ALBUMIN 3.7 08/08/2015 1231   ALBUMIN 3.6 12/10/2012 1121   AST 14 08/08/2015 1231   AST 26 12/10/2012 1121   ALT 18 08/08/2015 1231   ALT 29 12/10/2012 1121   ALKPHOS 65 08/08/2015 1231   ALKPHOS 43 12/10/2012 1121   BILITOT 0.73 08/08/2015 1231   BILITOT 0.4 12/10/2012 1121   GFRNONAA 65* 12/15/2012 0540   GFRAA 75* 12/15/2012 0540    No results found for: SPEP, UPEP  Lab Results  Component Value Date   WBC 4.2 08/08/2015   NEUTROABS 2.7 08/08/2015   HGB 13.3 08/08/2015   HCT 39.0 08/08/2015   MCV 97.1 08/08/2015   PLT 135* 08/08/2015      Chemistry      Component Value Date/Time   NA 141 08/08/2015 1231   NA 141 12/15/2012 0540   K 3.5 08/08/2015 1231   K 3.5 12/15/2012 0540    CL 109* 01/12/2013 0952   CL 107 12/15/2012 0540   CO2 28 08/08/2015 1231   CO2 25 12/15/2012 0540   BUN 14.4 08/08/2015 1231   BUN 8 12/15/2012 0540   CREATININE 1.1 08/08/2015 1231   CREATININE 1.00 12/15/2012 0540      Component Value Date/Time   CALCIUM 9.5 08/08/2015 1231   CALCIUM 8.9 12/15/2012 0540   ALKPHOS 65 08/08/2015 1231   ALKPHOS 43 12/10/2012 1121   AST 14 08/08/2015 1231   AST 26 12/10/2012 1121   ALT 18 08/08/2015 1231   ALT 29 12/10/2012 1121   BILITOT 0.73 08/08/2015 1231   BILITOT 0.4 12/10/2012 1121      ASSESSMENT & PLAN:  MGUS (monoclonal gammopathy of unknown significance) Repeat blood test showed no evidence of progression to myeloma. I will see him  on a yearly basis for this.     Back pain This is chronic. It has caused compression fracture. Continue conservative management. Recommend he continues vitamin D supplement, lidocaine patch and heat pad as needed    Thrombocytopenia, unspecified The cause is unknown. It is mild and there is little change compared from previous platelet count. He is asymptomatic from the thrombocytopenia. I will observe for now.       Orders Placed This Encounter  Procedures  . Comprehensive metabolic panel    Standing Status: Future     Number of Occurrences:      Standing Expiration Date: 09/18/2016  . CBC with Differential/Platelet    Standing Status: Future     Number of Occurrences:      Standing Expiration Date: 09/18/2016  . SPEP & IFE with QIG    Standing Status: Future     Number of Occurrences:      Standing Expiration Date: 09/18/2016  . Kappa/lambda light chains    Standing Status: Future     Number of Occurrences:      Standing Expiration Date: 09/18/2016   All questions were answered. The patient knows to call the clinic with any problems, questions or concerns. No barriers to learning was detected. I spent 15 minutes counseling the patient face to face. The total time spent in the  appointment was 20 minutes and more than 50% was on counseling and review of test results     [facility], [place], MD 08/15/2015 1:03 PM

## 2015-11-13 ENCOUNTER — Ambulatory Visit: Payer: Medicare Other | Admitting: Internal Medicine

## 2015-12-20 ENCOUNTER — Encounter (HOSPITAL_COMMUNITY): Payer: Self-pay | Admitting: *Deleted

## 2015-12-20 ENCOUNTER — Emergency Department (HOSPITAL_COMMUNITY)
Admission: EM | Admit: 2015-12-20 | Discharge: 2015-12-20 | Disposition: A | Discharge status: Home / Self Care | Payer: Medicare Other | Attending: Emergency Medicine | Admitting: Emergency Medicine

## 2015-12-20 DIAGNOSIS — Z8572 Personal history of non-Hodgkin lymphomas: Secondary | ICD-10-CM | POA: Insufficient documentation

## 2015-12-20 DIAGNOSIS — F410 Panic disorder [episodic paroxysmal anxiety] without agoraphobia: Secondary | ICD-10-CM | POA: Diagnosis not present

## 2015-12-20 DIAGNOSIS — Z87891 Personal history of nicotine dependence: Secondary | ICD-10-CM | POA: Diagnosis not present

## 2015-12-20 DIAGNOSIS — Z8719 Personal history of other diseases of the digestive system: Secondary | ICD-10-CM | POA: Diagnosis not present

## 2015-12-20 DIAGNOSIS — Z85828 Personal history of other malignant neoplasm of skin: Secondary | ICD-10-CM | POA: Insufficient documentation

## 2015-12-20 DIAGNOSIS — Z8619 Personal history of other infectious and parasitic diseases: Secondary | ICD-10-CM | POA: Diagnosis not present

## 2015-12-20 DIAGNOSIS — I2510 Atherosclerotic heart disease of native coronary artery without angina pectoris: Secondary | ICD-10-CM | POA: Insufficient documentation

## 2015-12-20 DIAGNOSIS — Z79899 Other long term (current) drug therapy: Secondary | ICD-10-CM | POA: Insufficient documentation

## 2015-12-20 DIAGNOSIS — Z792 Long term (current) use of antibiotics: Secondary | ICD-10-CM | POA: Insufficient documentation

## 2015-12-20 DIAGNOSIS — M1990 Unspecified osteoarthritis, unspecified site: Secondary | ICD-10-CM | POA: Insufficient documentation

## 2015-12-20 DIAGNOSIS — Z8639 Personal history of other endocrine, nutritional and metabolic disease: Secondary | ICD-10-CM | POA: Diagnosis not present

## 2015-12-20 DIAGNOSIS — R197 Diarrhea, unspecified: Secondary | ICD-10-CM | POA: Insufficient documentation

## 2015-12-20 DIAGNOSIS — I10 Essential (primary) hypertension: Secondary | ICD-10-CM | POA: Diagnosis not present

## 2015-12-20 DIAGNOSIS — Z86018 Personal history of other benign neoplasm: Secondary | ICD-10-CM | POA: Insufficient documentation

## 2015-12-20 NOTE — ED Provider Notes (Signed)
CSN: [id number]     Arrival date & time 12/20/15  0556 History   First MD Initiated Contact with Patient 12/20/15 [phone]     Chief Complaint  Patient presents with  . Diarrhea      HPI  Patient presents for evaluation of diarrhea. He describes onset of symptoms 2:00 this morning. He took an Imodium start. Continue to have symptoms for 3 hours. Tetracycline. Has not been more than 12 hours without diarrhea looks and feels well.  Denies any blood in stools. No dental pain. No vomiting. He is drinking water as I examine him. No dizziness lightheadedness.  Past Medical History  Diagnosis Date  . Arthritis   . Hypertension   . High cholesterol   . GERD (gastroesophageal reflux disease)   . Panic     panic disorder  . MGUS (monoclonal gammopathy of unknown significance) 06/07/2012  . Rhinitis 06/07/2012  . Paget's bone disease   . Skin cancer   . Non Hodgkin's lymphoma ([place]) dx'd 1997  . C. difficile colitis   . CAD (coronary artery disease) 02/08/2009    LHC: dilated ectatic segment in the midportion of the LAD with mild to mod. narrowing just distal to this.  Marland Kitchen Hx of lymphoma, non-Hodgkins 07/19/2013  . Basal cell carcinoma of face 07/20/2013    3 lesions excised Dr Link Snuffer approx  7/14  . Back pain 07/18/2014   Past Surgical History  Procedure Laterality Date  . Back surgery    . Neck surgery      c2  . Skin biopsy    . Hernia repair    . Prostatectomy    . Colonoscopy N/A 12/15/2012    Procedure: COLONOSCOPY;  Surgeon: Missy Sabins, MD;  Location: [facility];  Service: Endoscopy;  Laterality: N/A;  . Cardiac catheterization  02/08/2009    nonocclusive CAD   Family History  Problem Relation Age of Onset  . Hypertension Mother   . Heart failure Mother   . Hypertension Father   . Cancer Brother   . Heart failure Sister    Social History  Substance Use Topics  . Smoking status: Former Smoker    Quit date: 11/20/1968  . Smokeless tobacco: Never Used  . Alcohol Use: No     Review of Systems  Constitutional: Negative for fever, chills, diaphoresis, appetite change and fatigue.  HENT: Negative for mouth sores, sore throat and trouble swallowing.   Eyes: Negative for visual disturbance.  Respiratory: Negative for cough, chest tightness, shortness of breath and wheezing.   Cardiovascular: Negative for chest pain.  Gastrointestinal: Positive for diarrhea. Negative for nausea, vomiting, abdominal pain and abdominal distention.  Endocrine: Negative for polydipsia, polyphagia and polyuria.  Genitourinary: Negative for dysuria, frequency and hematuria.  Musculoskeletal: Negative for gait problem.  Skin: Negative for color change, pallor and rash.  Neurological: Negative for dizziness, syncope, light-headedness and headaches.  Hematological: Does not bruise/bleed easily.  Psychiatric/Behavioral: Negative for behavioral problems and confusion.      Allergies  Aspirin; Ciprofloxacin; Codeine; Iohexol; Macrodantin; and Morphine and related  Home Medications   Prior to Admission medications   Medication Sig Start Date End Date Taking? Authorizing Provider  ALPRAZolam Duanne Moron) 0.5 MG tablet Take 1 tablet (0.5 mg total) by mouth 2 (two) times daily as needed. anxiety 12/15/12   Bobby Rumpf York, PA-C  amLODipine (NORVASC) 5 MG tablet Take 1 tablet by mouth daily. 10/12/14   Historical Provider, MD  azithromycin (ZITHROMAX) 500 MG tablet Take 1  tablet (500 mg total) by mouth daily. 08/09/14   Heath Lark, MD  butalbital-acetaminophen-caffeine (FIORICET, ESGIC) 50-325-40 MG per tablet Take 1 tablet by mouth 2 (two) times daily as needed. For headaches.    Historical Provider, MD  carvedilol (COREG) 25 MG tablet Take 1 tablet (25 mg total) by mouth 2 (two) times daily. 05/10/15   Pixie Casino, MD  cetirizine (ZYRTEC) 10 MG tablet Take 0.5 tablets (5 mg total) by mouth daily. 07/19/13   Annia Belt, MD  cholecalciferol (VITAMIN D) 1000 UNITS tablet Take 1,000 Units  by mouth daily.    Historical Provider, MD  enalapril (VASOTEC) 10 MG tablet Take 5 mg by mouth daily.     Historical Provider, MD  ferrous sulfate 325 (65 FE) MG tablet Take 325 mg by mouth daily.     Historical Provider, MD  finasteride (PROSCAR) 5 MG tablet Take 5 mg by mouth daily. 02/14/14   Historical Provider, MD  fluocinonide ointment (LIDEX) [id number] % Apply 1 application topically 2 (two) times daily. rash    Historical Provider, MD  ipratropium (ATROVENT) 0.06 % nasal spray Place 2 sprays into both nostrils 3 (three) times daily.  04/24/15   Historical Provider, MD  lidocaine (LIDODERM) 5 % Place 1 patch onto the skin daily. Remove & Discard patch within 12 hours or as directed by MD 07/18/14   Heath Lark, MD  polycarbophil (FIBERCON) 625 MG tablet Take 1 tablet (625 mg total) by mouth daily. Do not restart for 1 week. 12/15/12   Melton Alar, PA-C  silodosin (RAPAFLO) 8 MG CAPS capsule Take 8 mg by mouth daily with breakfast.    Historical Provider, MD   BP 135/78 mmHg  Pulse 89  Temp(Src) 97.7 F (36.5 C)  Resp 18  SpO2 95% Physical Exam  Constitutional: He is oriented to person, place, and time. He appears well-developed and well-nourished. No distress.  HENT:  Head: Normocephalic.  Eyes: Conjunctivae are normal. Pupils are equal, round, and reactive to light. No scleral icterus.  Neck: Normal range of motion. Neck supple. No thyromegaly present.  Cardiovascular: Normal rate and regular rhythm.  Exam reveals no gallop and no friction rub.   No murmur heard. Pulmonary/Chest: Effort normal and breath sounds normal. No respiratory distress. He has no wheezes. He has no rales.  Abdominal: Soft. Bowel sounds are normal. He exhibits no distension. There is no tenderness. There is no rebound.  Musculoskeletal: Normal range of motion.  Neurological: He is alert and oriented to person, place, and time.  Skin: Skin is warm and dry. No rash noted.  Psychiatric: He has a normal mood and  affect. His behavior is normal.    ED Course  Procedures (including critical care time) Labs Review Labs Reviewed - No data to display  Imaging Review No results found. I have personally reviewed and evaluated these images and lab results as part of my medical decision-making.   EKG Interpretation None      MDM   Final diagnoses:  Diarrhea, unspecified type    Pt looks and feels well.  Politely declines any tests or treatment.  I reviewed return precautions with him, as well as his family.      Tanna Furry, MD 12/20/15 [phone]

## 2015-12-20 NOTE — ED Notes (Signed)
Bed: WA13 Expected date:  Expected time:  Means of arrival:  Comments: 

## 2015-12-20 NOTE — ED Notes (Signed)
Patient is alert and oriented x4.  He is complaining of diarrhea that started 4 hours ago. Patient states that he has had multiple episodes of diarrhea.  Patient denies any pain.

## 2015-12-20 NOTE — Discharge Instructions (Signed)
Return here with recurring/worsening diarrhea, pain, blood, fever, vomiting,dizziness.  Diarrhea Diarrhea is frequent loose and watery bowel movements. It can cause you to feel weak and dehydrated. Dehydration can cause you to become tired and thirsty, have a dry mouth, and have decreased urination that often is dark yellow. Diarrhea is a sign of another problem, most often an infection that will not last long. In most cases, diarrhea typically lasts 2-3 days. However, it can last longer if it is a sign of something more serious. It is important to treat your diarrhea as directed by your caregiver to lessen or prevent future episodes of diarrhea. CAUSES  Some common causes include:  Gastrointestinal infections caused by viruses, bacteria, or parasites.  Food poisoning or food allergies.  Certain medicines, such as antibiotics, chemotherapy, and laxatives.  Artificial sweeteners and fructose.  Digestive disorders. HOME CARE INSTRUCTIONS  Ensure adequate fluid intake (hydration): Have 1 cup (8 oz) of fluid for each diarrhea episode. Avoid fluids that contain simple sugars or sports drinks, fruit juices, whole milk products, and sodas. Your urine should be clear or pale yellow if you are drinking enough fluids. Hydrate with an oral rehydration solution that you can purchase at pharmacies, retail stores, and online. You can prepare an oral rehydration solution at home by mixing the following ingredients together:   - tsp table salt.   tsp baking soda.   tsp salt substitute containing potassium chloride.  1  tablespoons sugar.  1 L (34 oz) of water.  Certain foods and beverages may increase the speed at which food moves through the gastrointestinal (GI) tract. These foods and beverages should be avoided and include:  Caffeinated and alcoholic beverages.  High-fiber foods, such as raw fruits and vegetables, nuts, seeds, and whole grain breads and cereals.  Foods and beverages sweetened  with sugar alcohols, such as xylitol, sorbitol, and mannitol.  Some foods may be well tolerated and may help thicken stool including:  Starchy foods, such as rice, toast, pasta, low-sugar cereal, oatmeal, grits, baked potatoes, crackers, and bagels.  Bananas.  Applesauce.  Add probiotic-rich foods to help increase healthy bacteria in the GI tract, such as yogurt and fermented milk products.  Wash your hands well after each diarrhea episode.  Only take over-the-counter or prescription medicines as directed by your caregiver.  Take a warm bath to relieve any burning or pain from frequent diarrhea episodes. SEEK IMMEDIATE MEDICAL CARE IF:   You are unable to keep fluids down.  You have persistent vomiting.  You have blood in your stool, or your stools are black and tarry.  You do not urinate in 6-8 hours, or there is only a small amount of very dark urine.  You have abdominal pain that increases or localizes.  You have weakness, dizziness, confusion, or light-headedness.  You have a severe headache.  Your diarrhea gets worse or does not get better.  You have a fever or persistent symptoms for more than 2-3 days.  You have a fever and your symptoms suddenly get worse. MAKE SURE YOU:   Understand these instructions.  Will watch your condition.  Will get help right away if you are not doing well or get worse.   This information is not intended to replace advice given to you by your health care provider. Make sure you discuss any questions you have with your health care provider.   Document Released: 10/11/2002 Document Revised: 11/11/2014 Document Reviewed: 06/28/2012 Elsevier Interactive Patient Education [employer].

## 2016-02-01 ENCOUNTER — Other Ambulatory Visit: Payer: Self-pay | Admitting: Family Medicine

## 2016-02-01 ENCOUNTER — Ambulatory Visit
Admission: RE | Admit: 2016-02-01 | Discharge: 2016-02-01 | Disposition: A | Discharge status: Home / Self Care | Payer: Medicare Other | Source: Ambulatory Visit | Attending: Family Medicine | Admitting: Family Medicine

## 2016-02-01 DIAGNOSIS — R197 Diarrhea, unspecified: Secondary | ICD-10-CM

## 2016-04-19 ENCOUNTER — Encounter (HOSPITAL_COMMUNITY): Payer: Self-pay

## 2016-04-19 ENCOUNTER — Observation Stay (HOSPITAL_COMMUNITY)
Admission: EM | Admit: 2016-04-19 | Discharge: 2016-04-21 | Disposition: A | Discharge status: Home / Self Care | Payer: Medicare Other | Attending: Family Medicine | Admitting: Family Medicine

## 2016-04-19 DIAGNOSIS — K219 Gastro-esophageal reflux disease without esophagitis: Secondary | ICD-10-CM | POA: Diagnosis not present

## 2016-04-19 DIAGNOSIS — N400 Enlarged prostate without lower urinary tract symptoms: Secondary | ICD-10-CM | POA: Diagnosis not present

## 2016-04-19 DIAGNOSIS — D696 Thrombocytopenia, unspecified: Secondary | ICD-10-CM

## 2016-04-19 DIAGNOSIS — E785 Hyperlipidemia, unspecified: Secondary | ICD-10-CM | POA: Diagnosis not present

## 2016-04-19 DIAGNOSIS — Z79899 Other long term (current) drug therapy: Secondary | ICD-10-CM | POA: Diagnosis not present

## 2016-04-19 DIAGNOSIS — I10 Essential (primary) hypertension: Secondary | ICD-10-CM | POA: Diagnosis not present

## 2016-04-19 DIAGNOSIS — Z8572 Personal history of non-Hodgkin lymphomas: Secondary | ICD-10-CM | POA: Diagnosis not present

## 2016-04-19 DIAGNOSIS — Z85828 Personal history of other malignant neoplasm of skin: Secondary | ICD-10-CM | POA: Diagnosis not present

## 2016-04-19 DIAGNOSIS — Z9221 Personal history of antineoplastic chemotherapy: Secondary | ICD-10-CM | POA: Insufficient documentation

## 2016-04-19 DIAGNOSIS — D62 Acute posthemorrhagic anemia: Secondary | ICD-10-CM

## 2016-04-19 DIAGNOSIS — Z87891 Personal history of nicotine dependence: Secondary | ICD-10-CM | POA: Diagnosis not present

## 2016-04-19 DIAGNOSIS — K922 Gastrointestinal hemorrhage, unspecified: Principal | ICD-10-CM

## 2016-04-19 DIAGNOSIS — E7800 Pure hypercholesterolemia, unspecified: Secondary | ICD-10-CM | POA: Diagnosis not present

## 2016-04-19 DIAGNOSIS — F419 Anxiety disorder, unspecified: Secondary | ICD-10-CM | POA: Insufficient documentation

## 2016-04-19 DIAGNOSIS — I2510 Atherosclerotic heart disease of native coronary artery without angina pectoris: Secondary | ICD-10-CM | POA: Diagnosis not present

## 2016-04-19 HISTORY — DX: Other bacterial infections of unspecified site: A49.8

## 2016-04-19 LAB — COMPREHENSIVE METABOLIC PANEL
ALT: 11 U/L — ABNORMAL LOW (ref 17–63)
ANION GAP: 5 (ref 5–15)
AST: 16 U/L (ref 15–41)
Albumin: 3.9 g/dL (ref 3.5–5.0)
Alkaline Phosphatase: 66 U/L (ref 38–126)
BUN: 15 mg/dL (ref 6–20)
CALCIUM: 9.2 mg/dL (ref 8.9–10.3)
CO2: 24 mmol/L (ref 22–32)
Chloride: 110 mmol/L (ref 101–111)
Creatinine, Ser: 1.13 mg/dL (ref 0.61–1.24)
GFR, EST NON AFRICAN AMERICAN: 54 mL/min — AB (ref >60)
GLUCOSE: 105 mg/dL — AB (ref 65–99)
Potassium: 3.9 mmol/L (ref 3.5–5.1)
Sodium: 139 mmol/L (ref 135–145)
Total Bilirubin: 0.5 mg/dL (ref 0.3–1.2)
Total Protein: 6.6 g/dL (ref 6.5–8.1)

## 2016-04-19 LAB — CBC WITH DIFFERENTIAL/PLATELET
Basophils Absolute: 0 10*3/uL (ref 0.0–0.1)
Basophils Relative: 0 %
EOS PCT: 3 %
Eosinophils Absolute: 0.1 10*3/uL (ref 0.0–0.7)
HCT: 33.3 % — ABNORMAL LOW (ref 39.0–52.0)
Hemoglobin: 12 g/dL — ABNORMAL LOW (ref 13.0–17.0)
LYMPHS ABS: 1.1 10*3/uL (ref 0.7–4.0)
LYMPHS PCT: 25 %
MCH: 33.3 pg (ref 26.0–34.0)
MCHC: 36 g/dL (ref 30.0–36.0)
MCV: 92.5 fL (ref 78.0–100.0)
MONO ABS: 0.4 10*3/uL (ref 0.1–1.0)
Monocytes Relative: 9 %
NEUTROS PCT: 63 %
Neutro Abs: 2.8 10*3/uL (ref 1.7–7.7)
PLATELETS: 129 10*3/uL — AB (ref 150–400)
RBC: 3.6 MIL/uL — ABNORMAL LOW (ref 4.22–5.81)
RDW: 13 % (ref 11.5–15.5)
WBC: 4.5 10*3/uL (ref 4.0–10.5)

## 2016-04-19 LAB — HEMOGLOBIN AND HEMATOCRIT, BLOOD
HCT: 30.7 % — ABNORMAL LOW (ref 39.0–52.0)
HCT: 29.3 % — ABNORMAL LOW (ref 39.0–52.0)
HEMATOCRIT: 28.2 % — AB (ref 39.0–52.0)
HEMOGLOBIN: 10.4 g/dL — AB (ref 13.0–17.0)
Hemoglobin: 10.9 g/dL — ABNORMAL LOW (ref 13.0–17.0)
Hemoglobin: 9.9 g/dL — ABNORMAL LOW (ref 13.0–17.0)

## 2016-04-19 LAB — POC OCCULT BLOOD, ED: Fecal Occult Bld: POSITIVE — AB

## 2016-04-19 LAB — PREPARE RBC (CROSSMATCH)

## 2016-04-19 LAB — I-STAT CG4 LACTIC ACID, ED: Lactic Acid, Venous: 1.22 mmol/L (ref 0.5–2.0)

## 2016-04-19 MED ORDER — DIPHENHYDRAMINE HCL 25 MG PO CAPS: 25.0000 mg | ORAL_CAPSULE | Freq: Once | ORAL | Status: DC

## 2016-04-19 MED ORDER — SODIUM CHLORIDE 0.9 % IV SOLN: Freq: Once | INTRAVENOUS | Status: DC

## 2016-04-19 MED ORDER — TAMSULOSIN HCL 0.4 MG PO CAPS
0.4000 mg | ORAL_CAPSULE | Freq: Every day | ORAL | Status: DC
  Administered 2016-04-19 – 2016-04-20 (×2): 0.4 mg via ORAL
  Filled 2016-04-19 (×2): qty 1

## 2016-04-19 MED ORDER — ACETAMINOPHEN 650 MG RE SUPP: 650.0000 mg | Freq: Four times a day (QID) | RECTAL | Status: DC | PRN

## 2016-04-19 MED ORDER — POLYETHYLENE GLYCOL 3350 17 G PO PACK
17.0000 g | PACK | Freq: Three times a day (TID) | ORAL | Status: DC
  Administered 2016-04-19 – 2016-04-20 (×3): 17 g via ORAL
  Filled 2016-04-19 (×3): qty 1

## 2016-04-19 MED ORDER — ACETAMINOPHEN 325 MG PO TABS: 650.0000 mg | ORAL_TABLET | Freq: Four times a day (QID) | ORAL | Status: DC | PRN

## 2016-04-19 MED ORDER — LIDOCAINE 5 % EX PTCH
1.0000 | MEDICATED_PATCH | CUTANEOUS | Status: DC
  Administered 2016-04-19 – 2016-04-21 (×3): 1 via TRANSDERMAL
  Filled 2016-04-19 (×3): qty 1

## 2016-04-19 MED ORDER — CARVEDILOL 25 MG PO TABS
25.0000 mg | ORAL_TABLET | Freq: Two times a day (BID) | ORAL | Status: DC
  Administered 2016-04-19 – 2016-04-21 (×5): 25 mg via ORAL
  Filled 2016-04-19 (×5): qty 1

## 2016-04-19 MED ORDER — BUTALBITAL-APAP-CAFFEINE 50-325-40 MG PO TABS
1.0000 | ORAL_TABLET | Freq: Two times a day (BID) | ORAL | Status: DC | PRN
  Administered 2016-04-19 – 2016-04-20 (×2): 1 via ORAL
  Filled 2016-04-19 (×2): qty 1

## 2016-04-19 MED ORDER — ONDANSETRON HCL 4 MG/2ML IJ SOLN: 4.0000 mg | Freq: Four times a day (QID) | INTRAMUSCULAR | Status: DC | PRN

## 2016-04-19 MED ORDER — SODIUM CHLORIDE 0.9% FLUSH
3.0000 mL | Freq: Two times a day (BID) | INTRAVENOUS | Status: DC
  Administered 2016-04-19 – 2016-04-21 (×4): 3 mL via INTRAVENOUS

## 2016-04-19 MED ORDER — FINASTERIDE 5 MG PO TABS
5.0000 mg | ORAL_TABLET | Freq: Every day | ORAL | Status: DC
  Administered 2016-04-19 – 2016-04-21 (×3): 5 mg via ORAL
  Filled 2016-04-19 (×3): qty 1

## 2016-04-19 MED ORDER — FERROUS SULFATE 325 (65 FE) MG PO TABS
325.0000 mg | ORAL_TABLET | Freq: Every day | ORAL | Status: DC
  Administered 2016-04-20: 325 mg via ORAL
  Filled 2016-04-19 (×3): qty 1

## 2016-04-19 MED ORDER — ACETAMINOPHEN 325 MG PO TABS: 650.0000 mg | ORAL_TABLET | Freq: Once | ORAL | Status: DC

## 2016-04-19 MED ORDER — ALPRAZOLAM 0.5 MG PO TABS: 0.5000 mg | ORAL_TABLET | Freq: Two times a day (BID) | ORAL | Status: DC | PRN

## 2016-04-19 MED ORDER — ONDANSETRON HCL 4 MG PO TABS: 4.0000 mg | ORAL_TABLET | Freq: Four times a day (QID) | ORAL | Status: DC | PRN

## 2016-04-19 MED ORDER — AMLODIPINE BESYLATE 5 MG PO TABS
5.0000 mg | ORAL_TABLET | Freq: Every day | ORAL | Status: DC
  Administered 2016-04-19 – 2016-04-21 (×3): 5 mg via ORAL
  Filled 2016-04-19 (×3): qty 1

## 2016-04-19 MED ORDER — IPRATROPIUM BROMIDE 0.06 % NA SOLN
2.0000 | Freq: Three times a day (TID) | NASAL | Status: DC
  Administered 2016-04-19 – 2016-04-21 (×6): 2 via NASAL
  Filled 2016-04-19: qty 15

## 2016-04-19 MED ORDER — SODIUM CHLORIDE 0.9 % IV SOLN
INTRAVENOUS | Status: AC
  Administered 2016-04-19: 05:00:00 via INTRAVENOUS

## 2016-04-19 NOTE — Consult Note (Signed)
[place] Reason for consult: painless lower G.I. bleeding Referring Physician: [facility] hospitalist. PCP: Dr Frank Kramer. Primary G.I.: Dr. Valeta Kramer is an 82 y.o. male.  HPI: the patient has a history of non-Hodgkin's lymphoma and has been followed by the cancer center after receiving  [facility] chemotherapy in the past. He's had a history of ulcers allergies as well as C diff colitis. He's had a history of G.I. bleeding and had a colonoscopy by Dr. Amedeo Kramer 2014 revealing extensive diverticulosis of the left colon. He tends to be constipated and takes Miralax at home on a PRN basis according to his daughter. Last night he began to have painless rectal bleeding with multiple loose stools. Went during the night without passage of anymore bright red blood and then had some blood this morning. Hemoglobin 12.0 on admission down to 10.4 this morning. He continues to still have no abdominal pain.  Past Medical History  Diagnosis Date  . Arthritis   . Hypertension   . High cholesterol   . GERD (gastroesophageal reflux disease)   . Panic     panic disorder  . MGUS (monoclonal gammopathy of unknown significance) 06/07/2012  . Rhinitis 06/07/2012  . Paget's bone disease   . Skin cancer   . Non Hodgkin's lymphoma ([place]) dx'd 1997  . C. difficile colitis   . CAD (coronary artery disease) 02/08/2009    LHC: dilated ectatic segment in the midportion of the LAD with mild to mod. narrowing just distal to this.  Marland Kitchen Hx of lymphoma, non-Hodgkins 07/19/2013  . Basal cell carcinoma of face 07/20/2013    3 lesions excised Dr Frank Kramer approx  7/14  . Back pain 07/18/2014  . Clostridium difficile infection     Past Surgical History  Procedure Laterality Date  . Back surgery    . Neck surgery      c2  . Skin biopsy    . Hernia repair    . Prostatectomy    . Colonoscopy N/A 12/15/2012    Procedure: COLONOSCOPY;  Surgeon: Frank Sabins, MD;  Location: [facility];  Service: Endoscopy;  Laterality:  N/A;  . Cardiac catheterization  02/08/2009    nonocclusive CAD    Family History  Problem Relation Age of Onset  . Hypertension Mother   . Heart failure Mother   . Hypertension Father   . Cancer Brother   . Heart failure Sister     Social History:  reports that he quit smoking about 47 years ago. He has never used smokeless tobacco. He reports that he does not drink alcohol or use illicit drugs.  Allergies:  Allergies  Allergen Reactions  . Aspirin     GI bleeding  . Ciprofloxacin Other (See Comments)    Caused GI bleed  . Codeine Other (See Comments)    Patient cannot recall reaction  . Iohexol      Desc: HAS SEIZURES WITH X-RAY DYE-GIVEN 120 MG SOLU0MEDROL, 25 MG BENADRYL, AND 25 MG PEPCID 1 HOUR PRIOR TO EXAM AND HAD NO PROBLEMS-ARS-08/15/07   . Macrodantin Other (See Comments)    Patient cannot recall reaction  . Morphine And Related Itching    Medications; Prior to Admission medications   Medication Sig Start Date End Date Taking? Authorizing Provider  ALPRAZolam Duanne Moron) 0.5 MG tablet Take 1 tablet (0.5 mg total) by mouth 2 (two) times daily as needed. anxiety Patient taking differently: Take 0.5 mg by mouth 2 (two) times daily as needed for anxiety.  12/15/12  Yes Frank L York, PA-C  amLODipine (NORVASC) 5 MG tablet Take 1 tablet by mouth daily. 10/12/14  Yes Historical Provider, MD  butalbital-acetaminophen-caffeine (FIORICET, ESGIC) 50-325-40 MG per tablet Take 1 tablet by mouth 2 (two) times daily as needed. For headaches.   Yes Historical Provider, MD  carvedilol (COREG) 25 MG tablet Take 1 tablet (25 mg total) by mouth 2 (two) times daily. 05/10/15  Yes Pixie Casino, MD  cetirizine (ZYRTEC) 10 MG tablet Take 0.5 tablets (5 mg total) by mouth daily. 07/19/13  Yes Annia Belt, MD  cholecalciferol (VITAMIN D) 1000 UNITS tablet Take 1,000 Units by mouth daily.   Yes Historical Provider, MD  enalapril (VASOTEC) 10 MG tablet Take 5 mg by mouth daily.    Yes  Historical Provider, MD  ferrous sulfate 325 (65 FE) MG tablet Take 325 mg by mouth daily.    Yes Historical Provider, MD  finasteride (PROSCAR) 5 MG tablet Take 5 mg by mouth daily. 02/14/14  Yes Historical Provider, MD  fluocinonide ointment (LIDEX) 1.06 % Apply 1 application topically 2 (two) times daily. rash   Yes Historical Provider, MD  ipratropium (ATROVENT) 0.06 % nasal spray Place 2 sprays into both nostrils 3 (three) times daily.  04/24/15  Yes Historical Provider, MD  lidocaine (LIDODERM) 5 % Place 1 patch onto the skin daily. Remove & Discard patch within 12 hours or as directed by MD 07/18/14  Yes Heath Lark, MD  polycarbophil (FIBERCON) 625 MG tablet Take 1 tablet (625 mg total) by mouth daily. Do not restart for 1 week. 12/15/12  Yes Bobby Rumpf York, PA-C  silodosin (RAPAFLO) 8 MG CAPS capsule Take 8 mg by mouth daily with breakfast.   Yes Historical Provider, MD   . sodium chloride   Intravenous Once  . acetaminophen  650 mg Oral Once  . amLODipine  5 mg Oral Daily  . carvedilol  25 mg Oral BID  . diphenhydrAMINE  25 mg Oral Once  . ferrous sulfate  325 mg Oral Daily  . finasteride  5 mg Oral Daily  . ipratropium  2 spray Each Nare TID  . lidocaine  1 patch Transdermal Q24H  . polyethylene glycol  17 g Oral TID  . sodium chloride flush  3 mL Intravenous Q12H  . tamsulosin  0.4 mg Oral QPC supper   PRN Meds acetaminophen **OR** acetaminophen, ALPRAZolam, butalbital-acetaminophen-caffeine, ondansetron **OR** ondansetron (ZOFRAN) IV Results for orders placed or performed during the hospital encounter of 04/19/16 (from the past 48 hour(s))  CBC with Differential/Platelet     Status: Abnormal   Collection Time: 04/19/16  1:25 AM  Result Value Ref Range   WBC 4.5 4.0 - 10.5 K/uL   RBC 3.60 (L) 4.22 - 5.81 MIL/uL   Hemoglobin 12.0 (L) 13.0 - 17.0 g/dL   HCT 33.3 (L) 39.0 - 52.0 %   MCV 92.5 78.0 - 100.0 fL   MCH 33.3 26.0 - 34.0 pg   MCHC 36.0 30.0 - 36.0 g/dL   RDW 13.0 11.5  - 15.5 %   Platelets 129 (L) 150 - 400 K/uL   Neutrophils Relative % 63 %   Neutro Abs 2.8 1.7 - 7.7 K/uL   Lymphocytes Relative 25 %   Lymphs Abs 1.1 0.7 - 4.0 K/uL   Monocytes Relative 9 %   Monocytes Absolute 0.4 0.1 - 1.0 K/uL   Eosinophils Relative 3 %   Eosinophils Absolute 0.1 0.0 - 0.7 K/uL   Basophils Relative  0 %   Basophils Absolute 0.0 0.0 - 0.1 K/uL  Comprehensive metabolic panel     Status: Abnormal   Collection Time: 04/19/16  1:25 AM  Result Value Ref Range   Sodium 139 135 - 145 mmol/L   Potassium 3.9 3.5 - 5.1 mmol/L   Chloride 110 101 - 111 mmol/L   CO2 24 22 - 32 mmol/L   Glucose, Bld 105 (H) 65 - 99 mg/dL   BUN 15 6 - 20 mg/dL   Creatinine, Ser 1.13 0.61 - 1.24 mg/dL   Calcium 9.2 8.9 - 10.3 mg/dL   Total Protein 6.6 6.5 - 8.1 g/dL   Albumin 3.9 3.5 - 5.0 g/dL   AST 16 15 - 41 U/L   ALT 11 (L) 17 - 63 U/L   Alkaline Phosphatase 66 38 - 126 U/L   Total Bilirubin 0.5 0.3 - 1.2 mg/dL   GFR calc non Af Amer 54 (L) >60 mL/min   GFR calc Af Amer >60 >60 mL/min    Comment: (NOTE) The eGFR has been calculated using the CKD EPI equation. This calculation has not been validated in all clinical situations. eGFR's persistently <60 mL/min signify possible Chronic Kidney Disease.    Anion gap 5 5 - 15  Type and screen     Status: None (Preliminary result)   Collection Time: 04/19/16  1:25 AM  Result Value Ref Range   ABO/RH(D) O POS    Antibody Screen NEG    Sample Expiration 04/22/2016    Unit Number [id number]    Blood Component Type RED CELLS,LR    Unit division 00    Status of Unit ALLOCATED    Transfusion Status OK TO TRANSFUSE    Crossmatch Result Compatible    Unit Number [id number]    Blood Component Type RED CELLS,LR    Unit division 00    Status of Unit ALLOCATED    Transfusion Status OK TO TRANSFUSE    Crossmatch Result Compatible   I-Stat CG4 Lactic Acid, ED     Status: None   Collection Time: 04/19/16  1:39 AM  Result Value Ref Range    Lactic Acid, Venous 1.22 0.5 - 2.0 mmol/L  POC occult blood, ED RN will collect     Status: Abnormal   Collection Time: 04/19/16  2:39 AM  Result Value Ref Range   Fecal Occult Bld POSITIVE (A) NEGATIVE  Hemoglobin and hematocrit, blood     Status: Abnormal   Collection Time: 04/19/16  4:46 AM  Result Value Ref Range   Hemoglobin 10.9 (L) 13.0 - 17.0 g/dL   HCT 30.7 (L) 39.0 - 52.0 %  Prepare RBC     Status: None   Collection Time: 04/19/16  5:00 AM  Result Value Ref Range   Order Confirmation ORDER PROCESSED BY BLOOD BANK   Hemoglobin and hematocrit, blood     Status: Abnormal   Collection Time: 04/19/16  8:58 AM  Result Value Ref Range   Hemoglobin 10.4 (L) 13.0 - 17.0 g/dL   HCT 29.3 (L) 39.0 - 52.0 %    No results found. ROS: Patient does tend toward constipation but is a bit vague on what he does for this and how often he has bowel movements.            Blood pressure 148/64, pulse 72, temperature 97.9 F (36.6 C), temperature source Oral, resp. rate 15, height '5\' 7"'$  (1.702 m), weight 61.78 kg (136 lb 3.2 oz), SpO2 98 %.  Physical exam:   General-- slightly confused white male no acute distress. ENT-- nonicteric Neck-- supple with no lymphadenopathy Heart-- regular rate rhythm without murmurs gallops Lungs-- clear Abdomen-- soft and nontender Psych-- slightly confused but appears to answer questions appropriately   Assessment: 1. Painless hematochezia. This is probably due to diverticular bleeding. He just had a colonoscopy that was otherwise negative a few years ago. The patient has had previous G.I. bleeding requiring admission that was felt to be diverticular. There was some question of small bowel blood several years ago on a bleeding scan. His current presentation of bright red blood and very little drop in hemoglobin is most consistent with a diverticular bleed.  Plan: 1. At this point I would treat conservatively with clear liquids and Miralax. If he  continues to bleed drop his hemoglobin we may need another bleeding scan possible angiography etc. Have discussed this with the patient and his daughter. We will follow during this admission.   Frank Kramer,Shishir Krantz L 04/19/2016, 12:13 PM   This note was created using voice recognition software and minor errors may Have occurred unintentionally. Pager: [phone] If no answer or after hours call [phone]

## 2016-04-19 NOTE — ED Notes (Signed)
Patient arrives by EMS with complaints of going to the bathroom at midnight and states bright red blood came out.  Patient states diarrhea last week and has a hx of c-diff.  Patient coming from home.

## 2016-04-19 NOTE — H&P (Signed)
History and Physical    Frank Kramer [id number] DOB: [birth year] DOA: 04/19/2016  PCP: Lujean Amel MD  Kristen Cardinal  Patient coming from: Home  Chief Complaint: Painless BRBPR  HPI: Frank Kramer is a 82 y.o. gentleman with a history of CAD, HTN, HLD, NHL, and migraine headaches who presents to the ED accompanied by his two daughters for evaluation of painless BRBPR.  The patient has had similar presentations in 2013 and 2014.  He has been evaluated by Dr. Teena Irani of [facility] as an inpatient, but the family reports that he has never really followed up in clinic.  Tonight, painless rectal bleeding started around 11:30PM.  No abdominal pain.  No nausea or vomiting.  No dizziness. No chest pain or shortness of breath.  He admits to recent constipation and straining with bowel movements, but he also reports intermittent episodes of watery diarrhea for the past two months.  No fevers.  The patient does not take any blood thinners.  He uses Fioricet for his migraine headaches.  ED Course: Hemodynamically stable.  Initial Hgb 12.  Hospitalist asked to admit for observation.  Review of Systems: As per HPI otherwise 10 point review of systems negative.    Past Medical History  Diagnosis Date  . Arthritis   . Hypertension   . High cholesterol   . GERD (gastroesophageal reflux disease)   . Panic     panic disorder  . MGUS (monoclonal gammopathy of unknown significance) 06/07/2012  . Rhinitis 06/07/2012  . Paget's bone disease   . Skin cancer   . Non Hodgkin's lymphoma ([place]) dx'd 1997  . C. difficile colitis   . CAD (coronary artery disease) 02/08/2009    LHC: dilated ectatic segment in the midportion of the LAD with mild to mod. narrowing just distal to this.  Marland Kitchen Hx of lymphoma, non-Hodgkins 07/19/2013  . Basal cell carcinoma of face 07/20/2013    3 lesions excised Dr Link Snuffer approx  7/14  . Back pain 07/18/2014  . Clostridium difficile infection     Past Surgical History    Procedure Laterality Date  . Back surgery    . Neck surgery      c2  . Skin biopsy    . Hernia repair    . Prostatectomy    . Colonoscopy N/A 12/15/2012    Procedure: COLONOSCOPY;  Surgeon: Missy Sabins, MD;  Location: [facility];  Service: Endoscopy;  Laterality: N/A;  . Cardiac catheterization  02/08/2009    nonocclusive CAD     reports that he quit smoking about 47 years ago. He has never used smokeless tobacco. He reports that he does not drink alcohol or use illicit drugs.  He is a widower.  Two daughters present.  His daughter Sharyn Lull is his medical power of attorney.  Allergies  Allergen Reactions  . Aspirin     GI bleeding  . Ciprofloxacin Other (See Comments)    Caused GI bleed  . Codeine Other (See Comments)    Patient cannot recall reaction  . Iohexol      Desc: HAS SEIZURES WITH X-RAY DYE-GIVEN 120 MG SOLU0MEDROL, 25 MG BENADRYL, AND 25 MG PEPCID 1 HOUR PRIOR TO EXAM AND HAD NO PROBLEMS-ARS-08/15/07   . Macrodantin Other (See Comments)    Patient cannot recall reaction  . Morphine And Related Itching    Family History  Problem Relation Age of Onset  . Hypertension Mother   . Heart failure Mother   . Hypertension Father   .  Cancer Brother   . Heart failure Sister     Prior to Admission medications   Medication Sig Start Date End Date Taking? Authorizing Provider  ALPRAZolam Duanne Moron) 0.5 MG tablet Take 1 tablet (0.5 mg total) by mouth 2 (two) times daily as needed. anxiety Patient taking differently: Take 0.5 mg by mouth 2 (two) times daily as needed for anxiety.  12/15/12  Yes Marianne L York, PA-C  amLODipine (NORVASC) 5 MG tablet Take 1 tablet by mouth daily. 10/12/14  Yes Historical Provider, MD  butalbital-acetaminophen-caffeine (FIORICET, ESGIC) 50-325-40 MG per tablet Take 1 tablet by mouth 2 (two) times daily as needed. For headaches.   Yes Historical Provider, MD  carvedilol (COREG) 25 MG tablet Take 1 tablet (25 mg total) by mouth 2 (two) times daily.  05/10/15  Yes Pixie Casino, MD  cetirizine (ZYRTEC) 10 MG tablet Take 0.5 tablets (5 mg total) by mouth daily. 07/19/13  Yes Annia Belt, MD  cholecalciferol (VITAMIN D) 1000 UNITS tablet Take 1,000 Units by mouth daily.   Yes Historical Provider, MD  enalapril (VASOTEC) 10 MG tablet Take 5 mg by mouth daily.    Yes Historical Provider, MD  ferrous sulfate 325 (65 FE) MG tablet Take 325 mg by mouth daily.    Yes Historical Provider, MD  finasteride (PROSCAR) 5 MG tablet Take 5 mg by mouth daily. 02/14/14  Yes Historical Provider, MD  fluocinonide ointment (LIDEX) [id number] % Apply 1 application topically 2 (two) times daily. rash   Yes Historical Provider, MD  ipratropium (ATROVENT) 0.06 % nasal spray Place 2 sprays into both nostrils 3 (three) times daily.  04/24/15  Yes Historical Provider, MD  lidocaine (LIDODERM) 5 % Place 1 patch onto the skin daily. Remove & Discard patch within 12 hours or as directed by MD 07/18/14  Yes Heath Lark, MD  polycarbophil (FIBERCON) 625 MG tablet Take 1 tablet (625 mg total) by mouth daily. Do not restart for 1 week. 12/15/12  Yes Bobby Rumpf York, PA-C  silodosin (RAPAFLO) 8 MG CAPS capsule Take 8 mg by mouth daily with breakfast.   Yes Historical Provider, MD    Physical Exam: Filed Vitals:   04/19/16 0200 04/19/16 0225 04/19/16 0248 04/19/16 0324  BP: 167/74 164/74 171/64 156/62  Pulse: 63 79 66 71  Resp: 13 17 17 27   SpO2: 95% 93% 93% 93%      Constitutional: NAD, calm, comfortable Filed Vitals:   04/19/16 0200 04/19/16 0225 04/19/16 0248 04/19/16 0324  BP: 167/74 164/74 171/64 156/62  Pulse: 63 79 66 71  Resp: 13 17 17 27   SpO2: 95% 93% 93% 93%   Eyes: pupils equal and round, lids and conjunctiva are normal ENMT: Mucous membranes are slightly dry. Neck: normal, supple Respiratory: clear to auscultation bilaterally, no wheezing, no crackles. Normal respiratory effort. No accessory muscle use.  Cardiovascular: Regular rate and rhythm, no  murmurs / rubs / gallops. No extremity edema. 2+ pedal pulses.  Abdomen: RLQ tenderness on exam, no guarding.  No distention.  Abdomen soft, flat, compressible.  Bowel sounds are present.    Musculoskeletal: no clubbing / cyanosis. No joint deformity upper and lower extremities. Good ROM, no contractures. Normal muscle tone.  Skin: no rashes, Pale, dry Neurologic: No focal deficits Psychiatric: Normal judgment and insight. Alert and oriented x 3. Normal mood.     Labs on Admission: I have personally reviewed following labs and imaging studies  CBC:  Recent Labs Lab 04/19/16 0125  WBC 4.5  NEUTROABS 2.8  HGB 12.0*  HCT 33.3*  MCV 92.5  PLT [id number]*   Basic Metabolic Panel:  Recent Labs Lab 04/19/16 0125  NA 139  K 3.9  CL 110  CO2 24  GLUCOSE 105*  BUN 15  CREATININE 1.13  CALCIUM 9.2   GFR: CrCl cannot be calculated (Unknown ideal weight.). Liver Function Tests:  Recent Labs Lab 04/19/16 0125  AST 16  ALT 11*  ALKPHOS 66  BILITOT 0.5  PROT 6.6  ALBUMIN 3.9    EKG: Pending  Assessment/Plan Principal Problem:   LGI bleed Active Problems:   Acute blood loss anemia   Thrombocytopenia, unspecified (HCC)   Acute lower GI bleeding   Acute lower GI bleeding, suspect diverticular --Admit to telemetry --H/H q4h, monitor for transfusion requirement --Type and cross match 2 units of PRBCs now --Will need [facility] consult in the morning --If bleeding is not self-limited, consider NM tagged red cell scan (he has had this twice before)  Thrombocytopenia, chronic --monitor  HTN --Continue coreg, amlodipine (hold parameters in place).  HOLD ACE-I for now since he is at risk for volume depletion with active bleeding.   DVT prophylaxis: SCDs (active GI bleed) Code Status: FULL Family Communication: Two daughters at bedside Disposition Plan: Home when stable Consults called: NONE Admission status: Observation, telemetry   Eber Jones MD [facility]  If 7PM-7AM, please contact night-coverage [email or web address] Password [facility]  04/19/2016, 3:39 AM

## 2016-04-19 NOTE — Care Management Note (Signed)
Case Management Note  Patient Details  Name: Frank Kramer MRN: [id number] Date of Birth: [birth year]  Subjective/Objective: 82 y/o m admitted w/UGIB. From home w/spouse, has can,rw. Recc PT cons when appropriate-currently on bedrest.                   Action/Plan:d/c plan home w/HHC.   Expected Discharge Date:                 Expected Discharge Plan:  [place]  In-House Referral:     Discharge planning Services     Post Acute Care Choice:  Durable Medical Equipment (cane, rw) Choice offered to:     DME Arranged:    DME Agency:     HH Arranged:    HH Agency:     Status of Service:  In process, will continue to follow  Medicare Important Message Given:    Date Medicare IM Given:    Medicare IM give by:    Date Additional Medicare IM Given:    Additional Medicare Important Message give by:     If discussed at [place] of Stay Meetings, dates discussed:    Additional Comments:  Dessa Phi, RN 04/19/2016, 10:32 AM

## 2016-04-19 NOTE — ED Provider Notes (Signed)
CSN: [id number]     Arrival date & time 04/19/16  0044 History  By signing my name below, I, Frank Kramer, attest that this documentation has been prepared under the direction and in the presence of Frank Greek, MD.  Electronically Signed: Tedra Kramer. Sheppard Coil, ED Scribe. 04/19/2016. 1:40 AM.   Chief Complaint  Patient presents with  . Rectal Bleeding    The history is provided by the patient. No language interpreter was used.   HPI Comments: Frank Kramer is a 82 y.o. male brought in by ambulance, with PMHx of HTN, C. Diff, and CAD who presents to the Emergency Department complaining of sudden onset, rectal bleeding that occurred PTA. Pt reports he went to go the bathroom to make a bowel movement when he noticed large amounts of bright red blood in the stool. He states this is the third time this has happened, however no recent similar episodes. Pt reports having diarrhea one week ago that has now resolved. Denies any dizziness, weakness, palpitations, SOB, CP, or rectal pain.  Past Medical History  Diagnosis Date  . Arthritis   . Hypertension   . High cholesterol   . GERD (gastroesophageal reflux disease)   . Panic     panic disorder  . MGUS (monoclonal gammopathy of unknown significance) 06/07/2012  . Rhinitis 06/07/2012  . Paget's bone disease   . Skin cancer   . Non Hodgkin's lymphoma ([place]) dx'd 1997  . C. difficile colitis   . CAD (coronary artery disease) 02/08/2009    LHC: dilated ectatic segment in the midportion of the LAD with mild to mod. narrowing just distal to this.  Marland Kitchen Hx of lymphoma, non-Hodgkins 07/19/2013  . Basal cell carcinoma of face 07/20/2013    3 lesions excised Dr Link Snuffer approx  7/14  . Back pain 07/18/2014  . Clostridium difficile infection    Past Surgical History  Procedure Laterality Date  . Back surgery    . Neck surgery      c2  . Skin biopsy    . Hernia repair    . Prostatectomy    . Colonoscopy N/A 12/15/2012    Procedure:  COLONOSCOPY;  Surgeon: Missy Sabins, MD;  Location: [facility];  Service: Endoscopy;  Laterality: N/A;  . Cardiac catheterization  02/08/2009    nonocclusive CAD   Family History  Problem Relation Age of Onset  . Hypertension Mother   . Heart failure Mother   . Hypertension Father   . Cancer Brother   . Heart failure Sister    Social History  Substance Use Topics  . Smoking status: Former Smoker    Quit date: 11/20/1968  . Smokeless tobacco: Never Used  . Alcohol Use: No    Review of Systems  Respiratory: Negative for shortness of breath.   Cardiovascular: Negative for chest pain and palpitations.  Gastrointestinal: Positive for blood in stool. Negative for rectal pain.  Neurological: Negative for dizziness and weakness.  All other systems reviewed and are negative.   Allergies  Aspirin; Ciprofloxacin; Codeine; Iohexol; Macrodantin; and Morphine and related  Home Medications   Prior to Admission medications   Medication Sig Start Date End Date Taking? Authorizing Provider  ALPRAZolam Duanne Moron) 0.5 MG tablet Take 1 tablet (0.5 mg total) by mouth 2 (two) times daily as needed. anxiety 12/15/12   Bobby Rumpf York, PA-C  amLODipine (NORVASC) 5 MG tablet Take 1 tablet by mouth daily. 10/12/14   Historical Provider, MD  azithromycin (ZITHROMAX) 500  MG tablet Take 1 tablet (500 mg total) by mouth daily. 08/09/14   Heath Lark, MD  butalbital-acetaminophen-caffeine (FIORICET, ESGIC) 50-325-40 MG per tablet Take 1 tablet by mouth 2 (two) times daily as needed. For headaches.    Historical Provider, MD  carvedilol (COREG) 25 MG tablet Take 1 tablet (25 mg total) by mouth 2 (two) times daily. 05/10/15   Pixie Casino, MD  cetirizine (ZYRTEC) 10 MG tablet Take 0.5 tablets (5 mg total) by mouth daily. 07/19/13   Annia Belt, MD  cholecalciferol (VITAMIN D) 1000 UNITS tablet Take 1,000 Units by mouth daily.    Historical Provider, MD  enalapril (VASOTEC) 10 MG tablet Take 5 mg by mouth  daily.     Historical Provider, MD  ferrous sulfate 325 (65 FE) MG tablet Take 325 mg by mouth daily.     Historical Provider, MD  finasteride (PROSCAR) 5 MG tablet Take 5 mg by mouth daily. 02/14/14   Historical Provider, MD  fluocinonide ointment (LIDEX) [id number] % Apply 1 application topically 2 (two) times daily. rash    Historical Provider, MD  ipratropium (ATROVENT) 0.06 % nasal spray Place 2 sprays into both nostrils 3 (three) times daily.  04/24/15   Historical Provider, MD  lidocaine (LIDODERM) 5 % Place 1 patch onto the skin daily. Remove & Discard patch within 12 hours or as directed by MD 07/18/14   Heath Lark, MD  polycarbophil (FIBERCON) 625 MG tablet Take 1 tablet (625 mg total) by mouth daily. Do not restart for 1 week. 12/15/12   Melton Alar, PA-C  silodosin (RAPAFLO) 8 MG CAPS capsule Take 8 mg by mouth daily with breakfast.    Historical Provider, MD   There were no vitals taken for this visit. Physical Exam  Constitutional: He is oriented to person, place, and time. He appears well-developed and well-nourished. No distress.  HENT:  Head: Normocephalic and atraumatic.  Right Ear: Hearing normal.  Left Ear: Hearing normal.  Nose: Nose normal.  Mouth/Throat: Oropharynx is clear and moist and mucous membranes are normal.  Eyes: Conjunctivae and EOM are normal. Pupils are equal, round, and reactive to light.  Neck: Normal range of motion. Neck supple.  Cardiovascular: Regular rhythm, S1 normal and S2 normal.  Exam reveals no gallop and no friction rub.   No murmur heard. Pulmonary/Chest: Effort normal and breath sounds normal. No respiratory distress. He exhibits no tenderness.  Abdominal: Soft. Normal appearance and bowel sounds are normal. There is no hepatosplenomegaly. There is no tenderness. There is no rebound, no guarding, no tenderness at McBurney's point and negative Murphy's sign. No hernia.  Musculoskeletal: Normal range of motion.  Neurological: He is alert and  oriented to person, place, and time. He has normal strength. No cranial nerve deficit or sensory deficit. Coordination normal. GCS eye subscore is 4. GCS verbal subscore is 5. GCS motor subscore is 6.  Skin: Skin is warm, dry and intact. No rash noted. No cyanosis.  Psychiatric: He has a normal mood and affect. His speech is normal and behavior is normal. Thought content normal.  Nursing note and vitals reviewed.   ED Course  Procedures (including critical care time) DIAGNOSTIC STUDIES: Oxygen Saturation is 93% on RA, adequate by my interpretation.    COORDINATION OF CARE: 1:29 AM-Discussed treatment plan which includes CBC panel, CMP, EKG, and POC occult blood with pt at bedside and pt agreed to plan.   Labs Review Labs Reviewed  CBC WITH DIFFERENTIAL/PLATELET  COMPREHENSIVE METABOLIC  PANEL  I-STAT CG4 LACTIC ACID, ED  POC OCCULT BLOOD, ED  TYPE AND SCREEN    Imaging Review No results found. I have personally reviewed and evaluated these images and lab results as part of my medical decision-making.   EKG Interpretation None      MDM   Final diagnoses:  None  Lower GI bleed  Patient presents to the ER after large-volume bright red blood per rectum at home. Patient reports a history of GI bleeding in 2014. Patient had stable vital signs at arrival. His hemoglobin is 12.0. Remainder of labs were unremarkable. Abdominal exam is benign. He has not had any abdominal pain. Reviewing records from visit in 2014 revealed bleeding with moderate anemia, hemoglobin dropped to 8.9 but he did not require transfusion or intervention. He had a colonoscopy at that time that showed diverticular disease, no other abnormality. He did have C. difficile during that evaluation, has not had any significant diarrhea recently.  Based on the fact that the patient is continuously bleeding here in the ER, he will require hospitalization for following serial hemoglobins.  I personally performed the  services described in this documentation, which was scribed in my presence. The recorded information has been reviewed and is accurate.    Frank Greek, MD 04/19/16 [id number]

## 2016-04-19 NOTE — Care Management Obs Status (Signed)
[facility] NOTIFICATION   Patient Details  Name: JAWORSKI ROUND MRN: [id number] Date of Birth: 02-10-1922   Medicare Observation Status Notification Given:  Yes    MahabirJuliann Pulse, RN 04/19/2016, 10:33 AM

## 2016-04-19 NOTE — Progress Notes (Signed)
Subjective: Patient admitted this morning, see detailed H&P by Dr. Lily Kocher. 82 y.o. gentleman with a history of CAD, HTN, HLD, NHL, and migraine headaches who presents to the ED accompanied by his two daughters for evaluation of painless BRBPR. The patient has had similar presentations in 2013 and 2014. He has been evaluated by Dr. Teena Irani of [facility] as an inpatient, but the family reports that he has never really followed up in clinic. Tonight, painless rectal bleeding started around 11:30PM. No abdominal pain. No nausea or vomiting. No dizziness. No chest pain or shortness of breath. He admits to recent constipation and straining with bowel movements, but he also reports intermittent episodes of watery diarrhea for the past two months. No fevers.  This morning patient says that he hasn't had bloody bowel movement for last few hours Filed Vitals:   04/19/16 0417 04/19/16 1424  BP: 148/64 129/54  Pulse: 72   Temp: 97.9 F (36.6 C) 97.7 F (36.5 C)  Resp:  18    Chest: Clear Bilaterally Heart : S1S2 RRR Abdomen: Soft, nontender Ext : No edema Neuro: Alert, oriented x 3  A/P Lower GI bleed Anemia Thrombocytopenia Hypertension  Patient had colonoscopy in 2014, which showed diverticulosis  Patient was seen by [facility] at that time. We'll consult GI Hemoglobin is stable, will transfuse for hemoglobin less than 7.0   [place] [facility] Pager- [phone]

## 2016-04-20 DIAGNOSIS — K922 Gastrointestinal hemorrhage, unspecified: Secondary | ICD-10-CM | POA: Diagnosis not present

## 2016-04-20 DIAGNOSIS — D62 Acute posthemorrhagic anemia: Secondary | ICD-10-CM | POA: Diagnosis not present

## 2016-04-20 LAB — CBC
HEMATOCRIT: 28.1 % — AB (ref 39.0–52.0)
Hemoglobin: 9.9 g/dL — ABNORMAL LOW (ref 13.0–17.0)
MCH: 33.7 pg (ref 26.0–34.0)
MCHC: 35.2 g/dL (ref 30.0–36.0)
MCV: 95.6 fL (ref 78.0–100.0)
PLATELETS: 108 10*3/uL — AB (ref 150–400)
RBC: 2.94 MIL/uL — AB (ref 4.22–5.81)
RDW: 13.5 % (ref 11.5–15.5)
WBC: 3.5 10*3/uL — AB (ref 4.0–10.5)

## 2016-04-20 LAB — BASIC METABOLIC PANEL
ANION GAP: 4 — AB (ref 5–15)
BUN: 13 mg/dL (ref 6–20)
CALCIUM: 8.8 mg/dL — AB (ref 8.9–10.3)
CO2: 25 mmol/L (ref 22–32)
Chloride: 110 mmol/L (ref 101–111)
Creatinine, Ser: 1 mg/dL (ref 0.61–1.24)
Glucose, Bld: 103 mg/dL — ABNORMAL HIGH (ref 65–99)
POTASSIUM: 3.7 mmol/L (ref 3.5–5.1)
Sodium: 139 mmol/L (ref 135–145)

## 2016-04-20 NOTE — Progress Notes (Signed)
[facility]  PROGRESS NOTE  Frank Kramer [id number] DOB: 02-10-1922 DOA: 04/19/2016 PCP: Lujean Amel, MD    Brief HPI:  82 y.o. gentleman with a history of CAD, HTN, HLD, NHL, and migraine headaches who presents to the ED accompanied by his two daughters for evaluation of painless BRBPR. The patient has had similar presentations in 2013 and 2014. He has been evaluated by Dr. Teena Irani of [facility] as an inpatient, but the family reports that he has never really followed up in clinic. Tonight, painless rectal bleeding started around 11:30PM. No abdominal pain. No nausea or vomiting. No dizziness. No chest pain or shortness of breath. He admits to recent constipation and straining with bowel movements, but he also reports intermittent episodes of watery diarrhea for the past two months.   Principal Problem:   LGI bleed Active Problems:   Acute blood loss anemia   Thrombocytopenia, unspecified (HCC)   Acute lower GI bleeding   Assessment/Plan: 1. Rectal bleeding- likely diverticular, he has h/o colonic diverticulosis. GI is following and no plan for colonoscopy at this time.  2. Acute blood loss anemia- secondary to above, hemoglobin is 9.9 today down from 12.0 on 04/19/16. 3. Hypertension- BP is mildly elevated, continue with Coreg, Amlodipine. 4. BPH- continue Flomax, Proscar.    DVT prophylaxis: SCD Code Status:  Full code Family Communication: No family at bedside Disposition Plan: pending improvement of rectal bleed   Consultants:  GI  Procedures:  None   Antibiotics:  None   Subjective: Patient seen and examined, had only  Bloody BM last night. He feels better. Hemoglobin has remained stable at 9.9.  Objective: Filed Vitals:   04/19/16 1424 04/19/16 2205 04/19/16 2312 04/20/16 0456  BP: 129/54 170/63 149/69 156/67  Pulse:  79  75  Temp: 97.7 F (36.5 C) 97.9 F (36.6 C)  98.4 F (36.9 C)  TempSrc: Oral Oral  Oral  Resp: 18 16  16   Height:       Weight:      SpO2: 98% 95%  96%    Intake/Output Summary (Last 24 hours) at 04/20/16 1145 Last data filed at 04/19/16 2034  Gross per 24 hour  Intake 863.75 ml  Output      0 ml  Net 863.75 ml   Filed Weights   04/19/16 0417  Weight: 61.78 kg (136 lb 3.2 oz)    Examination:  General exam: Appears calm and comfortable  Respiratory system: Clear to auscultation. Respiratory effort normal. Cardiovascular system: S1 & S2 heard, RRR. No JVD, murmurs, rubs, gallops or clicks. No pedal edema. Gastrointestinal system: Abdomen is nondistended, soft and nontender. No organomegaly or masses felt. Normal bowel sounds heard. Central nervous system: Alert and oriented. No focal neurological deficits. Extremities: Symmetric 5 x 5 power. Skin: No rashes, lesions or ulcers Psychiatry: Judgement and insight appear normal. Mood & affect appropriate.    Data Reviewed: I have personally reviewed following labs and imaging studies Basic Metabolic Panel:  Recent Labs Lab 04/19/16 0125 04/20/16 0516  NA 139 139  K 3.9 3.7  CL 110 110  CO2 24 25  GLUCOSE 105* 103*  BUN 15 13  CREATININE 1.13 1.00  CALCIUM 9.2 8.8*   Liver Function Tests:  Recent Labs Lab 04/19/16 0125  AST 16  ALT 11*  ALKPHOS 66  BILITOT 0.5  PROT 6.6  ALBUMIN 3.9   No results for input(s): LIPASE, AMYLASE in the last 168 hours. No results for input(s): AMMONIA in the  last 168 hours. CBC:  Recent Labs Lab 04/19/16 0125 04/19/16 0446 04/19/16 0858 04/19/16 1257 04/20/16 0516  WBC 4.5  --   --   --  3.5*  NEUTROABS 2.8  --   --   --   --   HGB 12.0* 10.9* 10.4* 9.9* 9.9*  HCT 33.3* 30.7* 29.3* 28.2* 28.1*  MCV 92.5  --   --   --  95.6  PLT 129*  --   --   --  108*     Studies: No results found.  Scheduled Meds: . sodium chloride   Intravenous Once  . acetaminophen  650 mg Oral Once  . amLODipine  5 mg Oral Daily  . carvedilol  25 mg Oral BID  . diphenhydrAMINE  25 mg Oral Once  .  ferrous sulfate  325 mg Oral Daily  . finasteride  5 mg Oral Daily  . ipratropium  2 spray Each Nare TID  . lidocaine  1 patch Transdermal Q24H  . polyethylene glycol  17 g Oral TID  . sodium chloride flush  3 mL Intravenous Q12H  . tamsulosin  0.4 mg Oral QPC supper   Continuous Infusions:      Time spent: 35 min    [place] [facility] Pager [phone]. If 7PM-7AM, please contact night-coverage at [email or web address], Office  [phone]  password [facility] 04/20/2016, 11:45 AM

## 2016-04-20 NOTE — Progress Notes (Signed)
[facility] Progress Note  Subjective: The patient had 1 small dark bowel movement last night, no complaints  Objective: Vital signs in last 24 hours: Temp:  [97.7 F (36.5 C)-98.4 F (36.9 C)] 98.4 F (36.9 C) (06/17 0456) Pulse Rate:  [75-79] 75 (06/17 0456) Resp:  [16-18] 16 (06/17 0456) BP: (129-170)/(54-69) 156/67 mmHg (06/17 0456) SpO2:  [95 %-98 %] 96 % (06/17 0456) Weight change:    PE: Unchanged  Lab Results: Results for orders placed or performed during the hospital encounter of 04/19/16 (from the past 24 hour(s))  Hemoglobin and hematocrit, blood     Status: Abnormal   Collection Time: 04/19/16 12:57 PM  Result Value Ref Range   Hemoglobin 9.9 (L) 13.0 - 17.0 g/dL   HCT 28.2 (L) 39.0 - 52.0 %  CBC     Status: Abnormal   Collection Time: 04/20/16  5:16 AM  Result Value Ref Range   WBC 3.5 (L) 4.0 - 10.5 K/uL   RBC 2.94 (L) 4.22 - 5.81 MIL/uL   Hemoglobin 9.9 (L) 13.0 - 17.0 g/dL   HCT 28.1 (L) 39.0 - 52.0 %   MCV 95.6 78.0 - 100.0 fL   MCH 33.7 26.0 - 34.0 pg   MCHC 35.2 30.0 - 36.0 g/dL   RDW 13.5 11.5 - 15.5 %   Platelets 108 (L) 150 - 400 K/uL  Basic metabolic panel     Status: Abnormal   Collection Time: 04/20/16  5:16 AM  Result Value Ref Range   Sodium 139 135 - 145 mmol/L   Potassium 3.7 3.5 - 5.1 mmol/L   Chloride 110 101 - 111 mmol/L   CO2 25 22 - 32 mmol/L   Glucose, Bld 103 (H) 65 - 99 mg/dL   BUN 13 6 - 20 mg/dL   Creatinine, Ser 1.00 0.61 - 1.24 mg/dL   Calcium 8.8 (L) 8.9 - 10.3 mg/dL   GFR calc non Af Amer >60 >60 mL/min   GFR calc Af Amer >60 >60 mL/min   Anion gap 4 (L) 5 - 15    Studies/Results: No results found.    Assessment: Lower GI bleed, appears to have stopped, hemoglobin stable  Plan: Advance diet, observe stools hemoglobin overnight, possibly discharge tomorrow.    Tabor Denham,Rishan C 04/20/2016, 10:13 AM  Pager [phone] If no answer or after 5 PM call [phone]

## 2016-04-21 DIAGNOSIS — K922 Gastrointestinal hemorrhage, unspecified: Secondary | ICD-10-CM | POA: Diagnosis not present

## 2016-04-21 LAB — BASIC METABOLIC PANEL
ANION GAP: 4 — AB (ref 5–15)
BUN: 10 mg/dL (ref 6–20)
CALCIUM: 8.9 mg/dL (ref 8.9–10.3)
CO2: 25 mmol/L (ref 22–32)
Chloride: 110 mmol/L (ref 101–111)
Creatinine, Ser: 1 mg/dL (ref 0.61–1.24)
GLUCOSE: 100 mg/dL — AB (ref 65–99)
POTASSIUM: 3.7 mmol/L (ref 3.5–5.1)
Sodium: 139 mmol/L (ref 135–145)

## 2016-04-21 LAB — CBC
HCT: 28.3 % — ABNORMAL LOW (ref 39.0–52.0)
Hemoglobin: 10 g/dL — ABNORMAL LOW (ref 13.0–17.0)
MCH: 33.7 pg (ref 26.0–34.0)
MCHC: 35.3 g/dL (ref 30.0–36.0)
MCV: 95.3 fL (ref 78.0–100.0)
PLATELETS: 104 10*3/uL — AB (ref 150–400)
RBC: 2.97 MIL/uL — AB (ref 4.22–5.81)
RDW: 13.6 % (ref 11.5–15.5)
WBC: 5.3 10*3/uL (ref 4.0–10.5)

## 2016-04-21 NOTE — Progress Notes (Signed)
Tolerated solid food, heart healthy diet well. Ate 100%. No N/V/D. No pain or discomfort. Discharged home with family.

## 2016-04-21 NOTE — Discharge Summary (Signed)
Physician Discharge Summary  Frank Kramer [id number] DOB: 1922/08/31 DOA: 04/19/2016  PCP: Lujean Amel, MD  Admit date: 04/19/2016 Discharge date: 04/21/2016  Time spent: 35* minutes  Recommendations for Outpatient Follow-up:  1. Follow up Dr Amedeo Plenty in 2 weeks 2. Follow up PCP in 2 weeks   Discharge Diagnoses:  Principal Problem:   LGI bleed Active Problems:   Acute blood loss anemia   Thrombocytopenia, unspecified (HCC)   Acute lower GI bleeding   Discharge Condition: Stable  Diet recommendation: Low salt  diet  Filed Weights   04/19/16 0417  Weight: 61.78 kg (136 lb 3.2 oz)    History of present illness:  82 y.o. gentleman with a history of CAD, HTN, HLD, NHL, and migraine headaches who presents to the ED accompanied by his two daughters for evaluation of painless BRBPR. The patient has had similar presentations in 2013 and 2014. He has been evaluated by Dr. Teena Irani of [facility] as an inpatient, but the family reports that he has never really followed up in clinic. Tonight, painless rectal bleeding started around 11:30PM. No abdominal pain. No nausea or vomiting. No dizziness. No chest pain or shortness of breath. He admits to recent constipation and straining with bowel movements, but he also reports intermittent episodes of watery diarrhea for the past two months.  Hospital Course:  1. Rectal bleeding- likely diverticular,  Now resolved, he has h/o colonic diverticulosis. GI is following and no plan for colonoscopy at this time. Discussed with Dr Amedeo Plenty today and he agrees with discharge home and follow up as outpatient in clinic. 2. Acute blood loss anemia- secondary to above, hemoglobin is 10.0 up from 9.9 yesterday. 3. Hypertension- BP is mildly elevated, continue with Coreg, Amlodipine. 4. BPH- continue Flomax, Proscar.  Procedures:  None   Consultations:  GI  Discharge Exam: Filed Vitals:   04/20/16 2116 04/21/16 0443  BP: 153/57 160/66  Pulse:  80 78  Temp: 98.1 F (36.7 C) 98.3 F (36.8 C)  Resp: 17 18    General: Appears in no acute distress Cardiovascular: S1S2 RRR Respiratory: Clear bilaterally  Discharge Instructions   Discharge Instructions    Diet - low sodium heart healthy    Complete by:  As directed      Increase activity slowly    Complete by:  As directed           Current Discharge Medication List    CONTINUE these medications which have NOT CHANGED   Details  ALPRAZolam (XANAX) 0.5 MG tablet Take 1 tablet (0.5 mg total) by mouth 2 (two) times daily as needed. anxiety Qty: 20 tablet, Refills: 0    amLODipine (NORVASC) 5 MG tablet Take 1 tablet by mouth daily.    butalbital-acetaminophen-caffeine (FIORICET, ESGIC) 50-325-40 MG per tablet Take 1 tablet by mouth 2 (two) times daily as needed. For headaches.    carvedilol (COREG) 25 MG tablet Take 1 tablet (25 mg total) by mouth 2 (two) times daily. Qty: 180 tablet, Refills: 3    cetirizine (ZYRTEC) 10 MG tablet Take 0.5 tablets (5 mg total) by mouth daily. Qty: 30 tablet, Refills: 1   Associated Diagnoses: Rhinitis; Rhinitis, chronic    cholecalciferol (VITAMIN D) 1000 UNITS tablet Take 1,000 Units by mouth daily.    enalapril (VASOTEC) 10 MG tablet Take 5 mg by mouth daily.     ferrous sulfate 325 (65 FE) MG tablet Take 325 mg by mouth daily.     finasteride (PROSCAR) 5 MG tablet  Take 5 mg by mouth daily.    fluocinonide ointment (LIDEX) AB-[id number] % Apply 1 application topically 2 (two) times daily. rash    ipratropium (ATROVENT) 0.06 % nasal spray Place 2 sprays into both nostrils 3 (three) times daily.     lidocaine (LIDODERM) 5 % Place 1 patch onto the skin daily. Remove & Discard patch within 12 hours or as directed by MD Qty: 30 patch, Refills: 0   Associated Diagnoses: Needs flu shot; Paget's bone disease; MGUS (monoclonal gammopathy of unknown significance); Midline thoracic back pain    polycarbophil (FIBERCON) 625 MG tablet Take 1  tablet (625 mg total) by mouth daily. Do not restart for 1 week.    silodosin (RAPAFLO) 8 MG CAPS capsule Take 8 mg by mouth daily with breakfast.       Allergies  Allergen Reactions  . Aspirin     GI bleeding  . Ciprofloxacin Other (See Comments)    Caused GI bleed  . Codeine Other (See Comments)    Patient cannot recall reaction  . Iohexol      Desc: HAS SEIZURES WITH X-RAY DYE-GIVEN 120 MG SOLU0MEDROL, 25 MG BENADRYL, AND 25 MG PEPCID 1 HOUR PRIOR TO EXAM AND HAD NO PROBLEMS-ARS-08/15/07   . Macrodantin Other (See Comments)    Patient cannot recall reaction  . Morphine And Related Itching      The results of significant diagnostics from this hospitalization (including imaging, microbiology, ancillary and laboratory) are listed below for reference.    Significant Diagnostic Studies: No results found.  Microbiology: No results found for this or any previous visit (from the past 240 hour(s)).   Labs: Basic Metabolic Panel:  Recent Labs Lab 04/19/16 0125 04/20/16 0516 04/21/16 0450  NA 139 139 139  K 3.9 3.7 3.7  CL 110 110 110  CO2 24 25 25   GLUCOSE 105* 103* 100*  BUN 15 13 10   CREATININE 1.13 1.00 1.00  CALCIUM 9.2 8.8* 8.9   Liver Function Tests:  Recent Labs Lab 04/19/16 0125  AST 16  ALT 11*  ALKPHOS 66  BILITOT 0.5  PROT 6.6  ALBUMIN 3.9   No results for input(s): LIPASE, AMYLASE in the last 168 hours. No results for input(s): AMMONIA in the last 168 hours. CBC:  Recent Labs Lab 04/19/16 0125 04/19/16 0446 04/19/16 0858 04/19/16 1257 04/20/16 0516 04/21/16 0450  WBC 4.5  --   --   --  3.5* 5.3  NEUTROABS 2.8  --   --   --   --   --   HGB 12.0* 10.9* 10.4* 9.9* 9.9* 10.0*  HCT 33.3* 30.7* 29.3* 28.2* 28.1* 28.3*  MCV 92.5  --   --   --  95.6 95.3  PLT 129*  --   --   --  108* 104*    Signed:  Eaden Hettinger S MD.  [facility] 04/21/2016, 1:50 PM

## 2016-04-23 LAB — TYPE AND SCREEN
ABO/RH(D): O POS
Antibody Screen: NEGATIVE
UNIT DIVISION: 0
Unit division: 0

## 2016-05-18 ENCOUNTER — Other Ambulatory Visit: Payer: Self-pay | Admitting: Internal Medicine

## 2016-07-14 ENCOUNTER — Emergency Department (HOSPITAL_COMMUNITY): Payer: Medicare Other

## 2016-07-14 ENCOUNTER — Emergency Department (HOSPITAL_COMMUNITY)
Admission: EM | Admit: 2016-07-14 | Discharge: 2016-07-15 | Disposition: A | Discharge status: Home / Self Care | Payer: Medicare Other | Attending: Emergency Medicine | Admitting: Emergency Medicine

## 2016-07-14 ENCOUNTER — Encounter (HOSPITAL_COMMUNITY): Payer: Self-pay | Admitting: Emergency Medicine

## 2016-07-14 DIAGNOSIS — Z79899 Other long term (current) drug therapy: Secondary | ICD-10-CM | POA: Insufficient documentation

## 2016-07-14 DIAGNOSIS — Z79891 Long term (current) use of opiate analgesic: Secondary | ICD-10-CM | POA: Diagnosis not present

## 2016-07-14 DIAGNOSIS — Z87891 Personal history of nicotine dependence: Secondary | ICD-10-CM | POA: Diagnosis not present

## 2016-07-14 DIAGNOSIS — K5792 Diverticulitis of intestine, part unspecified, without perforation or abscess without bleeding: Secondary | ICD-10-CM | POA: Insufficient documentation

## 2016-07-14 DIAGNOSIS — R1032 Left lower quadrant pain: Secondary | ICD-10-CM | POA: Diagnosis present

## 2016-07-14 DIAGNOSIS — I2510 Atherosclerotic heart disease of native coronary artery without angina pectoris: Secondary | ICD-10-CM | POA: Insufficient documentation

## 2016-07-14 DIAGNOSIS — Z85828 Personal history of other malignant neoplasm of skin: Secondary | ICD-10-CM | POA: Diagnosis not present

## 2016-07-14 DIAGNOSIS — I10 Essential (primary) hypertension: Secondary | ICD-10-CM | POA: Insufficient documentation

## 2016-07-14 LAB — URINALYSIS, ROUTINE W REFLEX MICROSCOPIC
BILIRUBIN URINE: NEGATIVE
GLUCOSE, UA: NEGATIVE mg/dL
Hgb urine dipstick: NEGATIVE
KETONES UR: NEGATIVE mg/dL
Leukocytes, UA: NEGATIVE
Nitrite: NEGATIVE
PH: 7 (ref 5.0–8.0)
Protein, ur: NEGATIVE mg/dL
SPECIFIC GRAVITY, URINE: 1.018 (ref 1.005–1.030)

## 2016-07-14 LAB — COMPREHENSIVE METABOLIC PANEL
ALBUMIN: 3.9 g/dL (ref 3.5–5.0)
ALK PHOS: 76 U/L (ref 38–126)
ALT: 10 U/L — AB (ref 17–63)
AST: 13 U/L — AB (ref 15–41)
Anion gap: 5 (ref 5–15)
BUN: 16 mg/dL (ref 6–20)
CALCIUM: 9.4 mg/dL (ref 8.9–10.3)
CHLORIDE: 108 mmol/L (ref 101–111)
CO2: 27 mmol/L (ref 22–32)
CREATININE: 1.32 mg/dL — AB (ref 0.61–1.24)
GFR calc Af Amer: 52 mL/min — ABNORMAL LOW (ref >60)
GFR calc non Af Amer: 45 mL/min — ABNORMAL LOW (ref >60)
GLUCOSE: 98 mg/dL (ref 65–99)
Potassium: 3.8 mmol/L (ref 3.5–5.1)
SODIUM: 140 mmol/L (ref 135–145)
Total Bilirubin: 0.6 mg/dL (ref 0.3–1.2)
Total Protein: 6.7 g/dL (ref 6.5–8.1)

## 2016-07-14 LAB — DIFFERENTIAL
BASOS ABS: 0 10*3/uL (ref 0.0–0.1)
Basophils Relative: 0 %
EOS ABS: 0.1 10*3/uL (ref 0.0–0.7)
EOS PCT: 1 %
LYMPHS ABS: 1 10*3/uL (ref 0.7–4.0)
LYMPHS PCT: 12 %
MONOS PCT: 5 %
Monocytes Absolute: 0.4 10*3/uL (ref 0.1–1.0)
Neutro Abs: 6.2 10*3/uL (ref 1.7–7.7)
Neutrophils Relative %: 81 %

## 2016-07-14 LAB — I-STAT CG4 LACTIC ACID, ED: Lactic Acid, Venous: 0.98 mmol/L (ref 0.5–1.9)

## 2016-07-14 LAB — LIPASE, BLOOD: Lipase: 33 U/L (ref 11–51)

## 2016-07-14 MED ORDER — SODIUM CHLORIDE 0.9 % IV BOLUS (SEPSIS)
1000.0000 mL | Freq: Once | INTRAVENOUS | Status: AC
  Administered 2016-07-14: 1000 mL via INTRAVENOUS

## 2016-07-14 MED ORDER — FENTANYL CITRATE (PF) 100 MCG/2ML IJ SOLN
25.0000 ug | INTRAMUSCULAR | Status: DC | PRN
  Administered 2016-07-15: 25 ug via INTRAVENOUS
  Filled 2016-07-14: qty 2

## 2016-07-14 NOTE — ED Triage Notes (Signed)
Per EMS , pt. From home with complaint of generalized abdominal pain at 8/10 which started at 8am this morning , denied N/V , denied diarrhea, last BM today . Pt. Took 1 tab oxycodone at home an hour prior to coming to Ed , no relief. Pt. Was admitted in hospital 3 weeks ago for diverticulitis. Alert and oriented x3, denied SOB nor chest pain.

## 2016-07-14 NOTE — ED Provider Notes (Signed)
[name] DEPT Provider Note   CSN: [id number] Arrival date & time: 07/14/16  2016     History   Chief Complaint Chief Complaint  Patient presents with  . Abdominal Pain    HPI Frank Kramer is a 82 y.o. male.  The history is provided by the patient.  Abdominal Pain   This is a recurrent problem. The current episode started 12 to 24 hours ago. The problem occurs constantly. The problem has been gradually worsening. The pain is associated with an unknown factor. The pain is located in the LLQ. The pain is moderate. Associated symptoms include nausea. Pertinent negatives include fever, diarrhea, vomiting, constipation, dysuria and frequency. The symptoms are aggravated by certain positions. Nothing relieves the symptoms. Past workup includes CT scan. Past medical history comments: diverticulitis.    Past Medical History:  Diagnosis Date  . Arthritis   . Back pain 07/18/2014  . Basal cell carcinoma of face 07/20/2013   3 lesions excised Dr Link Snuffer approx  7/14  . C. difficile colitis   . CAD (coronary artery disease) 02/08/2009   LHC: dilated ectatic segment in the midportion of the LAD with mild to mod. narrowing just distal to this.  . Clostridium difficile infection   . GERD (gastroesophageal reflux disease)   . High cholesterol   . Hx of lymphoma, non-Hodgkins 07/19/2013  . Hypertension   . MGUS (monoclonal gammopathy of unknown significance) 06/07/2012  . Non Hodgkin's lymphoma ([place]) dx'd 1997  . Paget's bone disease   . Panic    panic disorder  . Rhinitis 06/07/2012  . Skin cancer     Patient Active Problem List   Diagnosis Date Noted  . Acute lower GI bleeding 04/19/2016  . Dyslipidemia 05/10/2015  . Midline low back pain without sciatica 08/09/2014  . Sinusitis 08/09/2014  . Back pain 07/18/2014  . Thrombocytopenia, unspecified ([place]) 07/18/2014  . Palpitations 02/18/2014  . Basal cell carcinoma of face 07/20/2013  . Hx of lymphoma, non-Hodgkins 07/19/2013  .  Acute blood loss anemia 12/15/2012  . C. difficile colitis 12/10/2012  . Dehydration 12/10/2012  . Orthostatic hypotension 12/10/2012  . Paget's bone disease 12/04/2012  . Colitis 12/04/2012  . LGI bleed 12/04/2012  . MGUS (monoclonal gammopathy of unknown significance) 06/07/2012  . Rhinitis 06/07/2012  . Rectal bleeding 11/21/2011  . HTN (hypertension) 11/21/2011  . GERD (gastroesophageal reflux disease) 11/21/2011  . Anxiety disorder 11/21/2011  . Arthritis 11/21/2011  . Anemia in neoplastic disease 11/21/2011    Past Surgical History:  Procedure Laterality Date  . BACK SURGERY    . CARDIAC CATHETERIZATION  02/08/2009   nonocclusive CAD  . COLONOSCOPY N/A 12/15/2012   Procedure: COLONOSCOPY;  Surgeon: Missy Sabins, MD;  Location: [facility];  Service: Endoscopy;  Laterality: N/A;  . HERNIA REPAIR    . NECK SURGERY     c2  . PROSTATECTOMY    . SKIN BIOPSY         Home Medications    Prior to Admission medications   Medication Sig Start Date End Date Taking? Authorizing Provider  acetaminophen (TYLENOL) 500 MG tablet Take 500 mg by mouth every 6 (six) hours as needed for moderate pain.   Yes Historical Provider, MD  Acetaminophen-Caffeine (EXCEDRIN TENSION HEADACHE) 500-65 MG TABS Take 2 tablets by mouth every 6 (six) hours as needed (Headache).   Yes Historical Provider, MD  ALPRAZolam Duanne Moron) 0.5 MG tablet Take 1 tablet (0.5 mg total) by mouth 2 (two) times daily as  needed. anxiety Patient taking differently: Take 0.5 mg by mouth 2 (two) times daily as needed for anxiety.  12/15/12  Yes Marianne L York, PA-C  amLODipine (NORVASC) 5 MG tablet Take 5 mg by mouth daily.  10/12/14  Yes Historical Provider, MD  butalbital-acetaminophen-caffeine (FIORICET, ESGIC) 50-325-40 MG per tablet Take 1 tablet by mouth 2 (two) times daily as needed for headache. For headaches.   Yes Historical Provider, MD  carvedilol (COREG) 25 MG tablet TAKE 1 TABLET TWICE A DAY 05/20/16  Yes Pixie Casino, MD  cetirizine (ZYRTEC) 10 MG tablet Take 0.5 tablets (5 mg total) by mouth daily. 07/19/13  Yes Annia Belt, MD  cholecalciferol (VITAMIN D) 1000 UNITS tablet Take 1,000 Units by mouth daily.   Yes Historical Provider, MD  enalapril (VASOTEC) 10 MG tablet Take 10 mg by mouth daily.    Yes Historical Provider, MD  ferrous sulfate 325 (65 FE) MG tablet Take 325 mg by mouth every other day.    Yes Historical Provider, MD  finasteride (PROSCAR) 5 MG tablet Take 5 mg by mouth daily. 02/14/14  Yes Historical Provider, MD  fluocinonide ointment (LIDEX) AB-123456789 % Apply 1 application topically 2 (two) times daily as needed (rash). rash    Yes Historical Provider, MD  ipratropium (ATROVENT) 0.06 % nasal spray Place 2 sprays into both nostrils 3 (three) times daily.  04/24/15  Yes Historical Provider, MD  lidocaine (LIDODERM) 5 % Place 1 patch onto the skin daily. Remove & Discard patch within 12 hours or as directed by MD 07/18/14  Yes Heath Lark, MD  polyethylene glycol (MIRALAX / GLYCOLAX) packet Take 17 g by mouth every other day.   Yes Historical Provider, MD    Family History Family History  Problem Relation Age of Onset  . Hypertension Mother   . Heart failure Mother   . Hypertension Father   . Cancer Brother   . Heart failure Sister     Social History Social History  Substance Use Topics  . Smoking status: Former Smoker    Quit date: 11/20/1968  . Smokeless tobacco: Never Used  . Alcohol use No     Allergies   Aspirin; Ciprofloxacin; Codeine; Iohexol; Macrodantin; and Morphine and related   Review of Systems Review of Systems  Constitutional: Negative for fever.  Gastrointestinal: Positive for abdominal pain and nausea. Negative for constipation, diarrhea and vomiting.  Genitourinary: Negative for dysuria and frequency.  All other systems reviewed and are negative.    Physical Exam Updated Vital Signs BP 144/72 (BP Location: Right Arm)   Pulse 92   Temp 98.3 F  (36.8 C) (Oral)   Resp 18   SpO2 93%   Physical Exam  Constitutional: He is oriented to person, place, and time. He appears well-developed and well-nourished. No distress.  HENT:  Head: Normocephalic and atraumatic.  Eyes: Conjunctivae are normal.  Neck: Neck supple. No tracheal deviation present.  Cardiovascular: Normal rate, regular rhythm and normal heart sounds.   Pulmonary/Chest: Effort normal and breath sounds normal. No respiratory distress.  Abdominal: Soft. He exhibits no distension. There is tenderness (LLQ). There is guarding. There is no rebound.  Neurological: He is alert and oriented to person, place, and time.  Skin: Skin is warm and dry.  Psychiatric: He has a normal mood and affect.  Vitals reviewed.    ED Treatments / Results  Labs (all labs ordered are listed, but only abnormal results are displayed) Labs Reviewed  COMPREHENSIVE METABOLIC PANEL -  Abnormal; Notable for the following:       Result Value   Creatinine, Ser 1.32 (*)    AST 13 (*)    ALT 10 (*)    GFR calc non Af Amer 45 (*)    GFR calc Af Amer 52 (*)    All other components within normal limits  LIPASE, BLOOD  URINALYSIS, ROUTINE W REFLEX MICROSCOPIC (NOT AT [facility])  DIFFERENTIAL  CBC WITH DIFFERENTIAL/PLATELET  I-STAT CG4 LACTIC ACID, ED  I-STAT CG4 LACTIC ACID, ED  WBC 7.5 Hb/Hct 11.4/32.4 Plt 138    EKG  EKG Interpretation None       Radiology Ct Abdomen Pelvis Wo Contrast  Result Date: 07/15/2016 CLINICAL DATA:  Generalized abdominal pain, onset this morning. Recent hospital admission for diverticulitis. EXAM: CT ABDOMEN AND PELVIS WITHOUT CONTRAST TECHNIQUE: Multidetector CT imaging of the abdomen and pelvis was performed following the standard protocol without IV contrast. COMPARISON:  12/04/2012, 11/21/2011 FINDINGS: Lower chest: No significant abnormality Hepatobiliary: A few hepatic hypodensities are present, not significantly changed from 2013. These presumably are benign  cysts although not conclusively characterized in the absence of intravenous contrast. No suspicious focal liver lesions. No significant biliary abnormalities are evident. Pancreas: Normal Spleen: Normal Adrenals/Urinary Tract: Stable renal hypodense lesions, presumably cysts. No suspicious renal parenchymal lesion. No hydronephrosis. No urinary calculi. Unremarkable ureters and urinary bladder. Both adrenals are unremarkable. Stomach/Bowel: There are acute inflammatory changes surrounding a diverticulum of the proximal sigmoid colon with appearances typical of acute diverticulitis. No abscess is evident. There is extensive left hemicolon diverticulosis. One of the distal descending diverticula protrudes into a left inguinal hernia. There are unremarkable appearances of the stomach and small bowel. Vascular/Lymphatic: The abdominal aorta is normal in caliber. There is extensive atherosclerotic calcification. There is no adenopathy in the abdomen or pelvis. Reproductive: Unremarkable Other: No ascites. Musculoskeletal: Extensive chronic skeletal sclerosis and lucency throughout the bony pelvis and T11, unchanged. IMPRESSION: 1. Acute diverticulitis, proximal sigmoid colon.  No abscess. 2. Extensive colonic diverticulosis. One of the distal descending colon diverticula protrudes into a left inguinal hernia, nonobstructing. 3. Extensive stable skeletal changes throughout the bony pelvis, proximal femurs and T11 vertebral body. 4. Stable hypodense lesions of the liver and kidneys, likely cysts. Electronically Signed   By: Andreas Newport M.D.   On: 07/15/2016 00:00    Procedures Procedures (including critical care time)  Medications Ordered in ED Medications  fentaNYL (SUBLIMAZE) injection 25 mcg (25 mcg Intravenous Given 07/15/16 0000)  sodium chloride 0.9 % bolus 1,000 mL (0 mLs Intravenous Stopped 07/15/16 0050)  acetaminophen (TYLENOL) tablet 650 mg (650 mg Oral Given 07/15/16 0031)     Initial  Impression / Assessment and Plan / ED Course  I have reviewed the triage vital signs and the nursing notes.  Pertinent labs & imaging results that were available during my care of the patient were reviewed by me and considered in my medical decision making (see chart for details).  Clinical Course   82 y.o. male presents with left lower abdominal pain worsening throughout the day. CT and exam c/w diverticulitis flare, pain controlled well with IV fentanyl in low doses. Recommended tylenol for pain and hydrocodone provided if this is not sufficient with plan to have less than 4g total of tylenol daily. No evidence of compolication on imaging and Pt not septic. Has cipro allergy so will cover for infection with short course of augmentin given advanced age and history of perforation remotely. Plan to follow up  with PCP as needed and return precautions discussed for worsening or new concerning symptoms.   Final Clinical Impressions(s) / ED Diagnoses   Final diagnoses:  Diverticulitis of intestine without perforation or abscess without bleeding    New Prescriptions Discharge Medication List as of 07/15/2016 12:25 AM    START taking these medications   Details  amoxicillin-clavulanate (AUGMENTIN) 875-125 MG tablet Take 1 tablet by mouth 2 (two) times daily., Starting Mon 07/15/2016, Until Sat 07/20/2016, Print    HYDROcodone-acetaminophen (NORCO/VICODIN) 5-325 MG tablet Take 2 tablets by mouth every 12 (twelve) hours as needed for severe pain (do not take with tylenol)., Starting Mon 07/15/2016, Print         Leo Grosser, MD 07/15/16 [phone]

## 2016-07-14 NOTE — ED Notes (Signed)
Bed: WA20 Expected date:  Expected time:  Means of arrival:  Comments: EMS 

## 2016-07-15 MED ORDER — HYDROCODONE-ACETAMINOPHEN 5-325 MG PO TABS: 2.0000 | ORAL_TABLET | Freq: Two times a day (BID) | ORAL | 0 refills | Status: DC | PRN

## 2016-07-15 MED ORDER — ACETAMINOPHEN 325 MG PO TABS
650.0000 mg | ORAL_TABLET | Freq: Once | ORAL | Status: AC
  Administered 2016-07-15: 650 mg via ORAL
  Filled 2016-07-15: qty 2

## 2016-07-15 MED ORDER — AMOXICILLIN-POT CLAVULANATE 875-125 MG PO TABS: 1.0000 | ORAL_TABLET | Freq: Two times a day (BID) | ORAL | 0 refills | Status: AC

## 2016-07-20 ENCOUNTER — Other Ambulatory Visit: Payer: Self-pay | Admitting: Internal Medicine

## 2016-08-06 ENCOUNTER — Other Ambulatory Visit (HOSPITAL_BASED_OUTPATIENT_CLINIC_OR_DEPARTMENT_OTHER): Payer: Medicare Other

## 2016-08-06 DIAGNOSIS — D472 Monoclonal gammopathy: Secondary | ICD-10-CM | POA: Diagnosis not present

## 2016-08-06 LAB — CBC WITH DIFFERENTIAL/PLATELET
BASO%: 0.5 % (ref 0.0–2.0)
BASOS ABS: 0 10*3/uL (ref 0.0–0.1)
EOS ABS: 0.1 10*3/uL (ref 0.0–0.5)
EOS%: 3.6 % (ref 0.0–7.0)
HCT: 33.5 % — ABNORMAL LOW (ref 38.4–49.9)
HEMOGLOBIN: 11.4 g/dL — AB (ref 13.0–17.1)
LYMPH%: 25 % (ref 14.0–49.0)
MCH: 31.9 pg (ref 27.2–33.4)
MCHC: 34 g/dL (ref 32.0–36.0)
MCV: 94 fL (ref 79.3–98.0)
MONO#: 0.5 10*3/uL (ref 0.1–0.9)
MONO%: 11.9 % (ref 0.0–14.0)
NEUT#: 2.3 10*3/uL (ref 1.5–6.5)
NEUT%: 59 % (ref 39.0–75.0)
Platelets: 128 10*3/uL — ABNORMAL LOW (ref 140–400)
RBC: 3.57 10*6/uL — ABNORMAL LOW (ref 4.20–5.82)
RDW: 14.4 % (ref 11.0–14.6)
WBC: 3.9 10*3/uL — ABNORMAL LOW (ref 4.0–10.3)
lymph#: 1 10*3/uL (ref 0.9–3.3)

## 2016-08-06 LAB — COMPREHENSIVE METABOLIC PANEL
ALBUMIN: 3.5 g/dL (ref 3.5–5.0)
ALK PHOS: 79 U/L (ref 40–150)
ALT: 10 U/L (ref 0–55)
AST: 11 U/L (ref 5–34)
Anion Gap: 7 mEq/L (ref 3–11)
BUN: 12.1 mg/dL (ref 7.0–26.0)
CO2: 26 mEq/L (ref 22–29)
Calcium: 9.6 mg/dL (ref 8.4–10.4)
Chloride: 107 mEq/L (ref 98–109)
Creatinine: 1.2 mg/dL (ref 0.7–1.3)
EGFR: 50 mL/min/{1.73_m2} — AB (ref >90)
GLUCOSE: 124 mg/dL (ref 70–140)
POTASSIUM: 4.2 meq/L (ref 3.5–5.1)
SODIUM: 140 meq/L (ref 136–145)
Total Bilirubin: 0.57 mg/dL (ref 0.20–1.20)
Total Protein: 6.7 g/dL (ref 6.4–8.3)

## 2016-08-07 LAB — KAPPA/LAMBDA LIGHT CHAINS
IG KAPPA FREE LIGHT CHAIN: 171.2 mg/L — AB (ref 3.3–19.4)
IG LAMBDA FREE LIGHT CHAIN: 15.7 mg/L (ref 5.7–26.3)
KAPPA/LAMBDA FLC RATIO: 10.9 — AB (ref 0.26–1.65)

## 2016-08-08 LAB — MULTIPLE MYELOMA PANEL, SERUM
ALBUMIN/GLOB SERPL: 1.3 (ref 0.7–1.7)
ALPHA 1: 0.2 g/dL (ref 0.0–0.4)
ALPHA2 GLOB SERPL ELPH-MCNC: 0.6 g/dL (ref 0.4–1.0)
Albumin SerPl Elph-Mcnc: 3.6 g/dL (ref 2.9–4.4)
B-GLOBULIN SERPL ELPH-MCNC: 0.8 g/dL (ref 0.7–1.3)
GLOBULIN, TOTAL: 2.8 g/dL (ref 2.2–3.9)
Gamma Glob SerPl Elph-Mcnc: 1.2 g/dL (ref 0.4–1.8)
IGM (IMMUNOGLOBIN M), SRM: 59 mg/dL (ref 15–143)
IgA, Qn, Serum: 164 mg/dL (ref 61–437)
M PROTEIN SERPL ELPH-MCNC: 0.4 g/dL — AB
Total Protein: 6.4 g/dL (ref 6.0–8.5)

## 2016-08-13 ENCOUNTER — Ambulatory Visit (HOSPITAL_BASED_OUTPATIENT_CLINIC_OR_DEPARTMENT_OTHER): Payer: Medicare Other | Admitting: Hematology and Oncology

## 2016-08-13 ENCOUNTER — Encounter: Payer: Self-pay | Admitting: Hematology and Oncology

## 2016-08-13 ENCOUNTER — Telehealth: Payer: Self-pay | Admitting: Hematology and Oncology

## 2016-08-13 VITALS — BP 118/50 | HR 68 | Temp 97.7°F | Resp 17 | Wt 132.1 lb

## 2016-08-13 DIAGNOSIS — K529 Noninfective gastroenteritis and colitis, unspecified: Secondary | ICD-10-CM | POA: Insufficient documentation

## 2016-08-13 DIAGNOSIS — D472 Monoclonal gammopathy: Secondary | ICD-10-CM

## 2016-08-13 DIAGNOSIS — K5732 Diverticulitis of large intestine without perforation or abscess without bleeding: Secondary | ICD-10-CM

## 2016-08-13 DIAGNOSIS — D61818 Other pancytopenia: Secondary | ICD-10-CM | POA: Diagnosis not present

## 2016-08-13 DIAGNOSIS — R197 Diarrhea, unspecified: Secondary | ICD-10-CM

## 2016-08-13 DIAGNOSIS — R634 Abnormal weight loss: Secondary | ICD-10-CM

## 2016-08-13 NOTE — Telephone Encounter (Signed)
Avs report and appointment schedule given to patient, per 08/13/16 los. °

## 2016-08-13 NOTE — Progress Notes (Signed)
[facility] OFFICE PROGRESS NOTE  Patient Care Team: Dibas Koirala, MD as PCP - General (Family Medicine)  SUMMARY OF ONCOLOGIC HISTORY:  This is a pleasant man with a remote history of high-grade non-Hodgkin's lymphoma diagnosed in September 1997. He was treated with CHOP chemotherapy prior to the Rituxan era and is likely cured. Further evaluation of normochromic anemia in August 2004 revealed that he had developed a kappa monoclonal gammopathy. This has been followed now for over 10 years and there have been no signs of progression to multiple myeloma. The patient had chronic rectal bleeding from hemorrhoids. He take iron sulfate regularly. He has severe degenerative arthritis. Several weeks ago, he had severe left knee pain and was seen by orthopedists. He has since improved. He has sudden onset of severe right scapular pain radiating down to his chest. It is graded at severe. He has taken some Vicodin at home. He felt that Lidoderm patches were helpful. He denies new neurological deficit. MRI of the back dated 07/21/2014 showed compression fracture.  He was managed conservatively  INTERVAL HISTORY: Please see below for problem oriented charting. He is seen today for his yearly follow-up. He denies worsening back pain. Denies new lymphadenopathy. He had recent recurrent bout of infection with diverticulitis, resolved. He has intermittent diarrhea alternate with days of constipation. He takes Miralax regularly. The patient denies any recent signs or symptoms of bleeding such as spontaneous epistaxis, hematuria or hematochezia.  REVIEW OF SYSTEMS:   Constitutional: Denies fevers, chills or abnormal weight loss Eyes: Denies blurriness of vision Ears, nose, mouth, throat, and face: Denies mucositis or sore throat Respiratory: Denies cough, dyspnea or wheezes Cardiovascular: Denies palpitation, chest discomfort or lower extremity swelling Skin: Denies abnormal skin  rashes Lymphatics: Denies new lymphadenopathy or easy bruising Neurological:Denies numbness, tingling or new weaknesses Behavioral/Psych: Mood is stable, no new changes  All other systems were reviewed with the patient and are negative.  I have reviewed the past medical history, past surgical history, social history and family history with the patient and they are unchanged from previous note.  ALLERGIES:  is allergic to aspirin; ciprofloxacin; codeine; iohexol; macrodantin; and morphine and related.  MEDICATIONS:  Current Outpatient Prescriptions  Medication Sig Dispense Refill  . acetaminophen (TYLENOL) 500 MG tablet Take 500 mg by mouth every 6 (six) hours as needed for moderate pain.    . Acetaminophen-Caffeine (EXCEDRIN TENSION HEADACHE) 500-65 MG TABS Take 2 tablets by mouth every 6 (six) hours as needed (Headache).    . ALPRAZolam (XANAX) 0.5 MG tablet Take 1 tablet (0.5 mg total) by mouth 2 (two) times daily as needed. anxiety (Patient taking differently: Take 0.5 mg by mouth 2 (two) times daily as needed for anxiety. ) 20 tablet 0  . amLODipine (NORVASC) 5 MG tablet Take 5 mg by mouth daily.     . butalbital-acetaminophen-caffeine (FIORICET, ESGIC) 50-325-40 MG per tablet Take 1 tablet by mouth 2 (two) times daily as needed for headache. For headaches.    . carvedilol (COREG) 25 MG tablet TAKE 1 TABLET TWICE A DAY (NEED APPOINTMENT FOR FURTHER REFILLS, CALL [phone]) 60 tablet 3  . cetirizine (ZYRTEC) 10 MG tablet Take 0.5 tablets (5 mg total) by mouth daily. 30 tablet 1  . cholecalciferol (VITAMIN D) 1000 UNITS tablet Take 1,000 Units by mouth daily.    . enalapril (VASOTEC) 10 MG tablet Take 10 mg by mouth daily.     . ferrous sulfate 325 (65 FE) MG tablet Take 325 mg  by mouth every other day.     . finasteride (PROSCAR) 5 MG tablet Take 5 mg by mouth daily.    . fluocinonide ointment (LIDEX) 2.35 % Apply 1 application topically 2 (two) times daily as needed (rash). rash     .  HYDROcodone-acetaminophen (NORCO/VICODIN) 5-325 MG tablet Take 2 tablets by mouth every 12 (twelve) hours as needed for severe pain (do not take with tylenol). 6 tablet 0  . ipratropium (ATROVENT) 0.06 % nasal spray Place 2 sprays into both nostrils 3 (three) times daily.     Marland Kitchen lidocaine (LIDODERM) 5 % Place 1 patch onto the skin daily. Remove & Discard patch within 12 hours or as directed by MD 30 patch 0  . polyethylene glycol (MIRALAX / GLYCOLAX) packet Take 17 g by mouth every other day.     No current facility-administered medications for this visit.     PHYSICAL EXAMINATION: ECOG PERFORMANCE STATUS: 2 - Symptomatic, <50% confined to bed  Vitals:   08/13/16 1316  BP: (!) 118/50  Pulse: 68  Resp: 17  Temp: 97.7 F (36.5 C)   Filed Weights   08/13/16 1316  Weight: 132 lb 1.6 oz (59.9 kg)    GENERAL:alert, no distress and comfortable. He looks thin SKIN: skin color, texture, turgor are normal, no rashes or significant lesions EYES: normal, Conjunctiva are pink and non-injected, sclera clear OROPHARYNX:no exudate, no erythema and lips, buccal mucosa, and tongue normal  NECK: supple, thyroid normal size, non-tender, without nodularity LYMPH:  no palpable lymphadenopathy in the cervical, axillary or inguinal LUNGS: clear to auscultation and percussion with normal breathing effort HEART: regular rate & rhythm and no murmurs and no lower extremity edema ABDOMEN:abdomen soft, non-tender and normal bowel sounds Musculoskeletal:no cyanosis of digits and no clubbing  NEURO: alert & oriented x 3 with fluent speech, no focal motor/sensory deficits  LABORATORY DATA:  I have reviewed the data as listed    Component Value Date/Time   NA 140 08/06/2016 1359   K 4.2 08/06/2016 1359   CL 108 07/14/2016 2045   CL 109 (H) 01/12/2013 0952   CO2 26 08/06/2016 1359   GLUCOSE 124 08/06/2016 1359   GLUCOSE 90 01/12/2013 0952   BUN 12.1 08/06/2016 1359   CREATININE 1.2 08/06/2016 1359    CALCIUM 9.6 08/06/2016 1359   PROT 6.4 08/06/2016 1359   PROT 6.7 08/06/2016 1359   ALBUMIN 3.5 08/06/2016 1359   AST 11 08/06/2016 1359   ALT 10 08/06/2016 1359   ALKPHOS 79 08/06/2016 1359   BILITOT 0.57 08/06/2016 1359   GFRNONAA 45 (L) 07/14/2016 2045   GFRAA 52 (L) 07/14/2016 2045    No results found for: SPEP, UPEP  Lab Results  Component Value Date   WBC 3.9 (L) 08/06/2016   NEUTROABS 2.3 08/06/2016   HGB 11.4 (L) 08/06/2016   HCT 33.5 (L) 08/06/2016   MCV 94.0 08/06/2016   PLT 128 (L) 08/06/2016      Chemistry      Component Value Date/Time   NA 140 08/06/2016 1359   K 4.2 08/06/2016 1359   CL 108 07/14/2016 2045   CL 109 (H) 01/12/2013 0952   CO2 26 08/06/2016 1359   BUN 12.1 08/06/2016 1359   CREATININE 1.2 08/06/2016 1359      Component Value Date/Time   CALCIUM 9.6 08/06/2016 1359   ALKPHOS 79 08/06/2016 1359   AST 11 08/06/2016 1359   ALT 10 08/06/2016 1359   BILITOT 0.57 08/06/2016 1359  ASSESSMENT & PLAN:  MGUS (monoclonal gammopathy of unknown significance) Repeat blood test showed no evidence of progression to myeloma. I will see him on a yearly basis for this.   Pancytopenia, acquired [facility]) He has mild pancytopenia, likely due to bone marrow suppression from recent illness. He is not symptomatic. I recommend observation only.  Weight loss He had recent weight loss related to diverticulitis. I recommend increase oral intake as tolerated and make sure he drinks a lot of fluid after each bout of diarrhea to prevent dehydration  Chronic diarrhea He has chronic, intermittent diarrhea related to recent diverticulitis. He is also taking regular laxatives. We discussed strategies to reduce laxatives intake if he continues to have intermittent diarrhea   Orders Placed This Encounter  Procedures  . CBC with Differential/Platelet    Standing Status:   Future    Standing Expiration Date:   09/17/2017  . Comprehensive metabolic panel     Standing Status:   Future    Standing Expiration Date:   09/17/2017  . Kappa/lambda light chains    Standing Status:   Future    Standing Expiration Date:   09/17/2017  . Multiple Myeloma Panel (SPEP&IFE w/QIG)    Standing Status:   Future    Standing Expiration Date:   09/17/2017   All questions were answered. The patient knows to call the clinic with any problems, questions or concerns. No barriers to learning was detected. I spent 15 minutes counseling the patient face to face. The total time spent in the appointment was 20 minutes and more than 50% was on counseling and review of test results     Heath Lark, MD 08/13/2016 1:36 PM

## 2016-08-13 NOTE — Assessment & Plan Note (Signed)
He has mild pancytopenia, likely due to bone marrow suppression from recent illness. He is not symptomatic. I recommend observation only.

## 2016-08-13 NOTE — Assessment & Plan Note (Signed)
He has chronic, intermittent diarrhea related to recent diverticulitis. He is also taking regular laxatives. We discussed strategies to reduce laxatives intake if he continues to have intermittent diarrhea

## 2016-08-13 NOTE — Assessment & Plan Note (Signed)
He had recent weight loss related to diverticulitis. I recommend increase oral intake as tolerated and make sure he drinks a lot of fluid after each bout of diarrhea to prevent dehydration

## 2016-08-13 NOTE — Assessment & Plan Note (Signed)
Repeat blood test showed no evidence of progression to myeloma. I will see him on a yearly basis for this.

## 2017-04-17 ENCOUNTER — Emergency Department (HOSPITAL_COMMUNITY): Payer: Medicare Other

## 2017-04-17 ENCOUNTER — Emergency Department (HOSPITAL_COMMUNITY)
Admission: EM | Admit: 2017-04-17 | Discharge: 2017-04-17 | Disposition: A | Discharge status: Home / Self Care | Payer: Medicare Other | Attending: Emergency Medicine | Admitting: Emergency Medicine

## 2017-04-17 ENCOUNTER — Encounter (HOSPITAL_COMMUNITY): Payer: Self-pay | Admitting: Emergency Medicine

## 2017-04-17 DIAGNOSIS — I10 Essential (primary) hypertension: Secondary | ICD-10-CM | POA: Diagnosis not present

## 2017-04-17 DIAGNOSIS — Z87891 Personal history of nicotine dependence: Secondary | ICD-10-CM | POA: Diagnosis not present

## 2017-04-17 DIAGNOSIS — Z79899 Other long term (current) drug therapy: Secondary | ICD-10-CM | POA: Diagnosis not present

## 2017-04-17 DIAGNOSIS — R197 Diarrhea, unspecified: Secondary | ICD-10-CM | POA: Diagnosis not present

## 2017-04-17 DIAGNOSIS — R112 Nausea with vomiting, unspecified: Secondary | ICD-10-CM | POA: Diagnosis present

## 2017-04-17 DIAGNOSIS — R1084 Generalized abdominal pain: Secondary | ICD-10-CM | POA: Diagnosis not present

## 2017-04-17 DIAGNOSIS — I2510 Atherosclerotic heart disease of native coronary artery without angina pectoris: Secondary | ICD-10-CM | POA: Insufficient documentation

## 2017-04-17 LAB — COMPREHENSIVE METABOLIC PANEL
ALT: 13 U/L — ABNORMAL LOW (ref 17–63)
ANION GAP: 5 (ref 5–15)
AST: 21 U/L (ref 15–41)
Albumin: 3.6 g/dL (ref 3.5–5.0)
Alkaline Phosphatase: 64 U/L (ref 38–126)
BILIRUBIN TOTAL: 1.1 mg/dL (ref 0.3–1.2)
BUN: 20 mg/dL (ref 6–20)
CHLORIDE: 103 mmol/L (ref 101–111)
CO2: 27 mmol/L (ref 22–32)
Calcium: 9.1 mg/dL (ref 8.9–10.3)
Creatinine, Ser: 1.48 mg/dL — ABNORMAL HIGH (ref 0.61–1.24)
GFR calc Af Amer: 45 mL/min — ABNORMAL LOW (ref >60)
GFR, EST NON AFRICAN AMERICAN: 39 mL/min — AB (ref >60)
Glucose, Bld: 121 mg/dL — ABNORMAL HIGH (ref 65–99)
POTASSIUM: 4.5 mmol/L (ref 3.5–5.1)
Sodium: 135 mmol/L (ref 135–145)
TOTAL PROTEIN: 6.7 g/dL (ref 6.5–8.1)

## 2017-04-17 LAB — CBC
HEMATOCRIT: 34.4 % — AB (ref 39.0–52.0)
HEMOGLOBIN: 12.1 g/dL — AB (ref 13.0–17.0)
MCH: 32.2 pg (ref 26.0–34.0)
MCHC: 35.2 g/dL (ref 30.0–36.0)
MCV: 91.5 fL (ref 78.0–100.0)
Platelets: 158 10*3/uL (ref 150–400)
RBC: 3.76 MIL/uL — AB (ref 4.22–5.81)
RDW: 13.5 % (ref 11.5–15.5)
WBC: 11.6 10*3/uL — AB (ref 4.0–10.5)

## 2017-04-17 LAB — LIPASE, BLOOD: LIPASE: 32 U/L (ref 11–51)

## 2017-04-17 MED ORDER — SODIUM CHLORIDE 0.9 % IV BOLUS (SEPSIS)
1000.0000 mL | Freq: Once | INTRAVENOUS | Status: AC
  Administered 2017-04-17: 1000 mL via INTRAVENOUS

## 2017-04-17 NOTE — ED Provider Notes (Addendum)
[place] DEPT Provider Note   CSN: [id number] Arrival date & time: 04/17/17  1612     History   Chief Complaint Chief Complaint  Patient presents with  . Emesis  . Diarrhea    HPI Frank Kramer is a 83 y.o. male.Patient had multiple episodes of nonbloody diarrhea and multiple episodes of vomiting Onset 2:30 PM today symptoms lasted 30 minutes and resolve. No fever. No nausea. No lightheadedness. Presently complains of mild left-sided abdominal pain. No other associated symptoms. Pain is constant. No treatment prior to coming here. Nothing makes symptoms better or worse. Nausea and diarrhea have resolved.  HPI  Past Medical History:  Diagnosis Date  . Arthritis   . Back pain 07/18/2014  . Basal cell carcinoma of face 07/20/2013   3 lesions excised Dr Link Snuffer approx  7/14  . C. difficile colitis   . CAD (coronary artery disease) 02/08/2009   LHC: dilated ectatic segment in the midportion of the LAD with mild to mod. narrowing just distal to this.  . Clostridium difficile infection   . GERD (gastroesophageal reflux disease)   . High cholesterol   . Hx of lymphoma, non-Hodgkins 07/19/2013  . Hypertension   . MGUS (monoclonal gammopathy of unknown significance) 06/07/2012  . Non Hodgkin's lymphoma ([place]) dx'd 1997  . Paget's bone disease   . Panic    panic disorder  . Rhinitis 06/07/2012  . Skin cancer     Patient Active Problem List   Diagnosis Date Noted  . Pancytopenia, acquired ([place]) 08/13/2016  . Weight loss 08/13/2016  . Chronic diarrhea 08/13/2016  . Acute lower GI bleeding 04/19/2016  . Dyslipidemia 05/10/2015  . Midline low back pain without sciatica 08/09/2014  . Sinusitis 08/09/2014  . Back pain 07/18/2014  . Thrombocytopenia, unspecified ([place]) 07/18/2014  . Palpitations 02/18/2014  . Basal cell carcinoma of face 07/20/2013  . Hx of lymphoma, non-Hodgkins 07/19/2013  . Acute blood loss anemia 12/15/2012  . C. difficile colitis 12/10/2012  . Dehydration  12/10/2012  . Orthostatic hypotension 12/10/2012  . Paget's bone disease 12/04/2012  . Colitis 12/04/2012  . LGI bleed 12/04/2012  . MGUS (monoclonal gammopathy of unknown significance) 06/07/2012  . Rhinitis 06/07/2012  . Rectal bleeding 11/21/2011  . HTN (hypertension) 11/21/2011  . GERD (gastroesophageal reflux disease) 11/21/2011  . Anxiety disorder 11/21/2011  . Arthritis 11/21/2011  . Anemia in neoplastic disease 11/21/2011    Past Surgical History:  Procedure Laterality Date  . BACK SURGERY    . CARDIAC CATHETERIZATION  02/08/2009   nonocclusive CAD  . COLONOSCOPY N/A 12/15/2012   Procedure: COLONOSCOPY;  Surgeon: Missy Sabins, MD;  Location: [facility];  Service: Endoscopy;  Laterality: N/A;  . HERNIA REPAIR    . NECK SURGERY     c2  . PROSTATECTOMY    . SKIN BIOPSY         Home Medications    Prior to Admission medications   Medication Sig Start Date End Date Taking? Authorizing Provider  ipratropium (ATROVENT) 0.06 % nasal spray USE 2 SPRAYS IN EACH NOSTRIL THREE TIMES A DAY AS NEEDED 12/11/16  Yes [provider]  acetaminophen (TYLENOL) 500 MG tablet Take 500 mg by mouth every 6 (six) hours as needed for moderate pain.    [provider]  Acetaminophen-Caffeine (EXCEDRIN TENSION HEADACHE) 500-65 MG TABS Take 2 tablets by mouth every 6 (six) hours as needed (Headache).    [provider]  ALPRAZolam Duanne Moron) 0.5 MG tablet Take 1 tablet (  0.5 mg total) by mouth 2 (two) times daily as needed. anxiety Patient taking differently: Take 0.5 mg by mouth 2 (two) times daily as needed for anxiety.  12/15/12   York, Marianne L, PA-C  amLODipine (NORVASC) 10 MG tablet TAKE 1 TABLET BY MOUTH ONCE DAILY FOR 10 DAYS 02/07/17   [provider]  amLODipine (NORVASC) 5 MG tablet Take 5 mg by mouth daily.  10/12/14   [provider]  butalbital-acetaminophen-caffeine (FIORICET, ESGIC) 50-325-40 MG per tablet Take 1 tablet by mouth 2 (two) times  daily as needed for headache. For headaches.    [provider]  carvedilol (COREG) 25 MG tablet TAKE 1 TABLET TWICE A DAY (NEED APPOINTMENT FOR FURTHER REFILLS, CALL [phone]) 07/22/16   Hilty, Nadean Corwin, MD  cetirizine (ZYRTEC) 10 MG tablet Take 0.5 tablets (5 mg total) by mouth daily. 07/19/13   Annia Belt, MD  cholecalciferol (VITAMIN D) 1000 UNITS tablet Take 1,000 Units by mouth daily.    [provider]  enalapril (VASOTEC) 10 MG tablet Take 10 mg by mouth daily.     [provider]  ferrous sulfate 325 (65 FE) MG tablet Take 325 mg by mouth every other day.     [provider]  finasteride (PROSCAR) 5 MG tablet Take 5 mg by mouth daily. 02/14/14   [provider]  fluocinonide ointment (LIDEX) 0.99 % Apply 1 application topically 2 (two) times daily as needed (rash). rash     [provider]  HYDROcodone-acetaminophen (NORCO/VICODIN) 5-325 MG tablet Take 2 tablets by mouth every 12 (twelve) hours as needed for severe pain (do not take with tylenol). 07/15/16   Leo Grosser, MD  ipratropium (ATROVENT) 0.06 % nasal spray Place 2 sprays into both nostrils 3 (three) times daily.  04/24/15   [provider]  lidocaine (LIDODERM) 5 % Place 1 patch onto the skin daily. Remove & Discard patch within 12 hours or as directed by MD 07/18/14   Heath Lark, MD  polyethylene glycol (MIRALAX / GLYCOLAX) packet Take 17 g by mouth every other day.    [provider]  RAPAFLO 8 MG CAPS capsule  04/02/17   [provider]    Family History Family History  Problem Relation Age of Onset  . Hypertension Mother   . Heart failure Mother   . Hypertension Father   . Cancer Brother   . Heart failure Sister     Social History Social History  Substance Use Topics  . Smoking status: Former Smoker    Quit date: 11/20/1968  . Smokeless tobacco: Never Used  . Alcohol use No     Allergies   Aspirin; Ciprofloxacin;  Codeine; Iohexol; Macrodantin; and Morphine and related   Review of Systems Review of Systems  Constitutional: Negative.   HENT: Negative.   Respiratory: Negative.   Cardiovascular: Negative.   Gastrointestinal: Positive for abdominal pain, diarrhea and vomiting.  Musculoskeletal: Negative.   Skin: Negative.   Allergic/Immunologic: Positive for immunocompromised state.  Neurological: Negative.   Psychiatric/Behavioral: Negative.   All other systems reviewed and are negative.    Physical Exam Updated Vital Signs BP (!) 120/50 (BP Location: Right Arm)   Pulse 66   Temp 97.9 F (36.6 C) (Oral)   Resp 16   Ht 5\' 7"  (1.702 m)   Wt 64 kg (141 lb)   SpO2 95%   BMI 22.08 kg/m   Physical Exam  Constitutional: He appears well-developed and well-nourished.  HENT:  Head: Normocephalic and atraumatic.  Eyes: Conjunctivae are normal. Pupils are equal, round, and reactive to light.  Neck: Neck supple. No tracheal deviation present. No thyromegaly present.  Cardiovascular: Normal rate and regular rhythm.   No murmur heard. Pulmonary/Chest: Effort normal and breath sounds normal.  Abdominal: Soft. Bowel sounds are normal. He exhibits no distension and no mass. There is tenderness. There is no rebound and no guarding.  Mild tenderness mid left abdomen  Genitourinary: Penis normal.  Musculoskeletal: Normal range of motion. He exhibits no edema or tenderness.  Neurological: He is alert. Coordination normal.  Skin: Skin is warm and dry. No rash noted.  Psychiatric: He has a normal mood and affect.  Nursing note and vitals reviewed.    ED Treatments / Results  Labs (all labs ordered are listed, but only abnormal results are displayed) Labs Reviewed  CBC - Abnormal; Notable for the following:       Result Value   WBC 11.6 (*)    RBC 3.76 (*)    Hemoglobin 12.1 (*)    HCT 34.4 (*)    All other components within normal limits  LIPASE, BLOOD  COMPREHENSIVE METABOLIC PANEL     EKG  EKG Interpretation None      Results for orders placed or performed during the hospital encounter of 04/17/17  Lipase, blood  Result Value Ref Range   Lipase 32 11 - 51 U/L  Comprehensive metabolic panel  Result Value Ref Range   Sodium 135 135 - 145 mmol/L   Potassium 4.5 3.5 - 5.1 mmol/L   Chloride 103 101 - 111 mmol/L   CO2 27 22 - 32 mmol/L   Glucose, Bld 121 (H) 65 - 99 mg/dL   BUN 20 6 - 20 mg/dL   Creatinine, Ser 1.48 (H) 0.61 - 1.24 mg/dL   Calcium 9.1 8.9 - 10.3 mg/dL   Total Protein 6.7 6.5 - 8.1 g/dL   Albumin 3.6 3.5 - 5.0 g/dL   AST 21 15 - 41 U/L   ALT 13 (L) 17 - 63 U/L   Alkaline Phosphatase 64 38 - 126 U/L   Total Bilirubin 1.1 0.3 - 1.2 mg/dL   GFR calc non Af Amer 39 (L) >60 mL/min   GFR calc Af Amer 45 (L) >60 mL/min   Anion gap 5 5 - 15  CBC  Result Value Ref Range   WBC 11.6 (H) 4.0 - 10.5 K/uL   RBC 3.76 (L) 4.22 - 5.81 MIL/uL   Hemoglobin 12.1 (L) 13.0 - 17.0 g/dL   HCT 34.4 (L) 39.0 - 52.0 %   MCV 91.5 78.0 - 100.0 fL   MCH 32.2 26.0 - 34.0 pg   MCHC 35.2 30.0 - 36.0 g/dL   RDW 13.5 11.5 - 15.5 %   Platelets 158 150 - 400 K/uL   Ct Abdomen Pelvis Wo Contrast  Result Date: 04/17/2017 CLINICAL DATA:  Nausea vomiting and diarrhea EXAM: CT ABDOMEN AND PELVIS WITHOUT CONTRAST TECHNIQUE: Multidetector CT imaging of the abdomen and pelvis was performed following the standard protocol without IV contrast. COMPARISON:  07/14/2016 FINDINGS: Lower chest: Patchy dependent atelectasis within the posterior lung bases. Bronchiectasis in the posterior lower lobes. No acute consolidation or pleural effusion. Coronary artery calcifications. Borderline cardiomegaly. Hepatobiliary: Stable hypodense liver lesion, likely representing cysts. Largest is seen in the inferior right lobe and measures 2.7 cm. No biliary dilatation. Gallbladder is nonvisualized and may be surgically absent. Pancreas: Unremarkable. No pancreatic ductal dilatation or surrounding  inflammatory  changes. Spleen: Normal in size without focal abnormality. Adrenals/Urinary Tract: Adrenal glands are within normal limits. Multiple cysts within the bilateral kidneys, some containing layering calcium deposits. Small stones or intrarenal vascular calcifications mid left kidney. No hydronephrosis. Mild thickened appearance of bladder wall slightly asymmetric left posterior. Stomach/Bowel: Sigmoid colon diverticula without acute inflammation. No evidence for small bowel obstruction. Appendix normal. Stomach unremarkable. Vascular/Lymphatic: Aortic atherosclerosis. Ectasia of the distal infrarenal abdominal aorta up to 2.9 cm. No significantly enlarged lymph nodes. Reproductive: Enlarged prostate gland with mass effect on the bladder. Other: No free air or free fluid. Musculoskeletal: Mixed lucent and sclerotic appearance of the sacrum pelvis and imaged femurs, unchanged. Similar disease at T11 with mild compression deformity, unchanged. Status post intramedullary rodding of the proximal right femur. IMPRESSION: 1. Negative for bowel obstruction, free air or free fluid. 2. Sigmoid colon diverticular disease without acute inflammation 3. Multiple cysts within the bilateral kidneys.  Liver cysts. 4. Asymmetric enlargement of the prostate gland with asymmetric left posterior bladder wall thickening, similar compared to prior exams 5. Ectasia of the distal infrarenal abdominal aorta up to 2.9 cm. Ectatic abdominal aorta at risk for aneurysm development. Recommend followup by ultrasound in 5 years. This recommendation follows ACR consensus guidelines: White Paper of the ACR Incidental Findings Committee II on Vascular Findings. J Am Coll Radiol 2013; 10:789-794. 6. Stable diffuse sclerosis and lucent lesions within the pelvis, bilateral femurs, and T11 vertebral body. Electronically Signed   By: Donavan Foil M.D.   On: 04/17/2017 19:27   Radiology No results found.  Procedures Procedures (including  critical care time)  Medications Ordered in ED Medications - No data to display   Initial Impression / Assessment and Plan / ED Course  I have reviewed the triage vital signs and the nursing notes.  Pertinent labs & imaging results that were available during my care of the patient were reviewed by me and considered in my medical decision making (see chart for details).     8 PM patient is alert awake and in no distress. He has no pain. He was able to drink without difficulty. No further episodes diarrhea. Renal insufficiency is chronic. Plan encourage hydration. Avoid dairy. Follow up with PMD if not improving in 2 or 3 days. He's been advised as to his ectatic aorta.  Final Clinical Impressions(s) / ED Diagnoses  Diagnosis #1 nausea vomiting diarrhea  #2 left-sided abdominal pain  #3 chronic renal insufficiency  Final diagnoses:  None    New Prescriptions New Prescriptions   No medications on file     Orlie Dakin, MD 04/17/17 2011    Orlie Dakin, MD 04/17/17 2012

## 2017-04-17 NOTE — ED Notes (Signed)
Patient verbalizes understanding of discharge instructions, home care and follow up care if needed. Patient out of department at this time. 

## 2017-04-17 NOTE — ED Triage Notes (Signed)
Pt comes from home via EMS with complaints of nausea, vomiting, diarrhea over the past 4 hours. Pt states he has not eaten anything out of the ordinary. No emesis noted in route. No zofran given in route due to patient stating he is not nauseated at this time. 300 cc of fluid given. Denies any other complaints. BP 120/54 in route. A&O x4. Ambulatory with walker.CBG 130.

## 2017-04-17 NOTE — Discharge Instructions (Signed)
Avoid milk or foods containing milk such as cheese or ice cream all having diarrhea. It is okay to take Imodium as directed for diarrhea.Make sure that you drink at least six 8 ounce glasses of water or Gatorade each day in order to stay well-hydrated.  Your aorta is slightly large. It should be rechecked with an ultrasound study to be ordered by your primary care physician in 5 years. Return if you're unable hold down fluids without vomiting, or feel worse for any reason

## 2017-04-17 NOTE — ED Notes (Signed)
Bed: WA09 Expected date:  Expected time:  Means of arrival:  Comments: 83 yo f abd pain diarrhea

## 2017-05-26 ENCOUNTER — Emergency Department (HOSPITAL_COMMUNITY): Payer: Medicare Other

## 2017-05-26 ENCOUNTER — Encounter (HOSPITAL_COMMUNITY): Payer: Self-pay | Admitting: Emergency Medicine

## 2017-05-26 ENCOUNTER — Inpatient Hospital Stay (HOSPITAL_COMMUNITY)
Admission: EM | Admit: 2017-05-26 | Discharge: 2017-06-02 | DRG: 200 | Disposition: A | Discharge status: Skilled Nursing Facility | Payer: Medicare Other | Attending: General Surgery | Admitting: General Surgery

## 2017-05-26 DIAGNOSIS — Z8572 Personal history of non-Hodgkin lymphomas: Secondary | ICD-10-CM

## 2017-05-26 DIAGNOSIS — Z9889 Other specified postprocedural states: Secondary | ICD-10-CM

## 2017-05-26 DIAGNOSIS — S2242XA Multiple fractures of ribs, left side, initial encounter for closed fracture: Secondary | ICD-10-CM | POA: Diagnosis present

## 2017-05-26 DIAGNOSIS — R339 Retention of urine, unspecified: Secondary | ICD-10-CM | POA: Diagnosis not present

## 2017-05-26 DIAGNOSIS — W01190A Fall on same level from slipping, tripping and stumbling with subsequent striking against furniture, initial encounter: Secondary | ICD-10-CM | POA: Diagnosis present

## 2017-05-26 DIAGNOSIS — Z79899 Other long term (current) drug therapy: Secondary | ICD-10-CM

## 2017-05-26 DIAGNOSIS — S272XXA Traumatic hemopneumothorax, initial encounter: Principal | ICD-10-CM | POA: Diagnosis present

## 2017-05-26 DIAGNOSIS — J939 Pneumothorax, unspecified: Secondary | ICD-10-CM

## 2017-05-26 DIAGNOSIS — Y92003 Bedroom of unspecified non-institutional (private) residence as the place of occurrence of the external cause: Secondary | ICD-10-CM | POA: Diagnosis not present

## 2017-05-26 DIAGNOSIS — Z85828 Personal history of other malignant neoplasm of skin: Secondary | ICD-10-CM

## 2017-05-26 DIAGNOSIS — I119 Hypertensive heart disease without heart failure: Secondary | ICD-10-CM | POA: Diagnosis present

## 2017-05-26 DIAGNOSIS — W19XXXA Unspecified fall, initial encounter: Secondary | ICD-10-CM

## 2017-05-26 DIAGNOSIS — Z87891 Personal history of nicotine dependence: Secondary | ICD-10-CM | POA: Diagnosis not present

## 2017-05-26 DIAGNOSIS — F419 Anxiety disorder, unspecified: Secondary | ICD-10-CM | POA: Diagnosis present

## 2017-05-26 DIAGNOSIS — M25512 Pain in left shoulder: Secondary | ICD-10-CM

## 2017-05-26 DIAGNOSIS — J942 Hemothorax: Secondary | ICD-10-CM | POA: Diagnosis present

## 2017-05-26 DIAGNOSIS — J9811 Atelectasis: Secondary | ICD-10-CM | POA: Diagnosis present

## 2017-05-26 DIAGNOSIS — Z8249 Family history of ischemic heart disease and other diseases of the circulatory system: Secondary | ICD-10-CM | POA: Diagnosis not present

## 2017-05-26 DIAGNOSIS — Z9689 Presence of other specified functional implants: Secondary | ICD-10-CM

## 2017-05-26 DIAGNOSIS — Z9181 History of falling: Secondary | ICD-10-CM

## 2017-05-26 LAB — CBC WITH DIFFERENTIAL/PLATELET
Basophils Absolute: 0 10*3/uL (ref 0.0–0.1)
Basophils Relative: 1 %
EOS ABS: 0.1 10*3/uL (ref 0.0–0.7)
EOS PCT: 3 %
HCT: 31.9 % — ABNORMAL LOW (ref 39.0–52.0)
Hemoglobin: 11.4 g/dL — ABNORMAL LOW (ref 13.0–17.0)
LYMPHS ABS: 1.1 10*3/uL (ref 0.7–4.0)
Lymphocytes Relative: 22 %
MCH: 32.7 pg (ref 26.0–34.0)
MCHC: 35.7 g/dL (ref 30.0–36.0)
MCV: 91.4 fL (ref 78.0–100.0)
MONO ABS: 0.4 10*3/uL (ref 0.1–1.0)
Monocytes Relative: 9 %
Neutro Abs: 3.2 10*3/uL (ref 1.7–7.7)
Neutrophils Relative %: 66 %
PLATELETS: 119 10*3/uL — AB (ref 150–400)
RBC: 3.49 MIL/uL — AB (ref 4.22–5.81)
RDW: 13.3 % (ref 11.5–15.5)
WBC: 4.9 10*3/uL (ref 4.0–10.5)

## 2017-05-26 LAB — I-STAT CHEM 8, ED
BUN: 13 mg/dL (ref 6–20)
CALCIUM ION: 1.22 mmol/L (ref 1.15–1.40)
Chloride: 98 mmol/L — ABNORMAL LOW (ref 101–111)
Creatinine, Ser: 1.1 mg/dL (ref 0.61–1.24)
GLUCOSE: 110 mg/dL — AB (ref 65–99)
HCT: 30 % — ABNORMAL LOW (ref 39.0–52.0)
HEMOGLOBIN: 10.2 g/dL — AB (ref 13.0–17.0)
Potassium: 3.7 mmol/L (ref 3.5–5.1)
SODIUM: 133 mmol/L — AB (ref 135–145)
TCO2: 24 mmol/L (ref 0–100)

## 2017-05-26 LAB — PROTIME-INR
INR: 1.08
Prothrombin Time: 14.1 seconds (ref 11.4–15.2)

## 2017-05-26 LAB — URINALYSIS, ROUTINE W REFLEX MICROSCOPIC
Bacteria, UA: NONE SEEN
Bilirubin Urine: NEGATIVE
GLUCOSE, UA: NEGATIVE mg/dL
Ketones, ur: NEGATIVE mg/dL
Leukocytes, UA: NEGATIVE
NITRITE: NEGATIVE
PH: 6 (ref 5.0–8.0)
PROTEIN: NEGATIVE mg/dL
SPECIFIC GRAVITY, URINE: 1.01 (ref 1.005–1.030)
Squamous Epithelial / LPF: NONE SEEN

## 2017-05-26 LAB — LIPASE, BLOOD: Lipase: 31 U/L (ref 11–51)

## 2017-05-26 LAB — TSH: TSH: 0.83 u[IU]/mL (ref 0.350–4.500)

## 2017-05-26 LAB — MAGNESIUM: MAGNESIUM: 1.9 mg/dL (ref 1.7–2.4)

## 2017-05-26 LAB — TROPONIN I: Troponin I: <0.03 ng/mL (ref <0.03)

## 2017-05-26 MED ORDER — METHOCARBAMOL 1000 MG/10ML IJ SOLN
500.0000 mg | Freq: Three times a day (TID) | INTRAVENOUS | Status: DC | PRN
  Administered 2017-05-27: 500 mg via INTRAVENOUS
  Filled 2017-05-26: qty 5

## 2017-05-26 MED ORDER — ACETAMINOPHEN 500 MG PO TABS: 500.0000 mg | ORAL_TABLET | Freq: Four times a day (QID) | ORAL | Status: DC | PRN

## 2017-05-26 MED ORDER — ENALAPRIL MALEATE 10 MG PO TABS
10.0000 mg | ORAL_TABLET | Freq: Two times a day (BID) | ORAL | Status: DC
  Administered 2017-05-27 (×2): 10 mg via ORAL
  Filled 2017-05-26 (×6): qty 1

## 2017-05-26 MED ORDER — POLYETHYLENE GLYCOL 3350 17 G PO PACK
17.0000 g | PACK | Freq: Every day | ORAL | Status: DC | PRN
  Administered 2017-05-28 – 2017-05-30 (×2): 17 g via ORAL
  Filled 2017-05-26 (×2): qty 1

## 2017-05-26 MED ORDER — HYDRALAZINE HCL 20 MG/ML IJ SOLN: 10.0000 mg | INTRAMUSCULAR | Status: DC | PRN

## 2017-05-26 MED ORDER — TRAMADOL HCL 50 MG PO TABS
50.0000 mg | ORAL_TABLET | Freq: Four times a day (QID) | ORAL | Status: DC | PRN
  Administered 2017-05-27: 50 mg via ORAL
  Filled 2017-05-26: qty 1

## 2017-05-26 MED ORDER — PANTOPRAZOLE SODIUM 40 MG IV SOLR
40.0000 mg | Freq: Every day | INTRAVENOUS | Status: DC
  Administered 2017-05-27: 40 mg via INTRAVENOUS
  Filled 2017-05-26: qty 40

## 2017-05-26 MED ORDER — PANTOPRAZOLE SODIUM 40 MG PO TBEC
40.0000 mg | DELAYED_RELEASE_TABLET | Freq: Every day | ORAL | Status: DC
  Administered 2017-05-28 – 2017-06-02 (×6): 40 mg via ORAL
  Filled 2017-05-26 (×6): qty 1

## 2017-05-26 MED ORDER — ACETAMINOPHEN 325 MG PO TABS: 650.0000 mg | ORAL_TABLET | ORAL | Status: DC | PRN

## 2017-05-26 MED ORDER — AMLODIPINE BESYLATE 10 MG PO TABS
10.0000 mg | ORAL_TABLET | Freq: Every day | ORAL | Status: DC
  Administered 2017-05-27: 10 mg via ORAL
  Filled 2017-05-26: qty 1

## 2017-05-26 MED ORDER — FENTANYL CITRATE (PF) 100 MCG/2ML IJ SOLN
50.0000 ug | Freq: Once | INTRAMUSCULAR | Status: AC
Start: 2017-05-26 — End: 2017-05-26
  Administered 2017-05-26: 50 ug via INTRAVENOUS
  Filled 2017-05-26: qty 2

## 2017-05-26 MED ORDER — ONDANSETRON HCL 4 MG/2ML IJ SOLN: 4.0000 mg | Freq: Four times a day (QID) | INTRAMUSCULAR | Status: DC | PRN

## 2017-05-26 MED ORDER — ALPRAZOLAM 0.5 MG PO TABS
0.5000 mg | ORAL_TABLET | Freq: Two times a day (BID) | ORAL | Status: DC | PRN
  Administered 2017-05-27 – 2017-06-02 (×5): 0.5 mg via ORAL
  Filled 2017-05-26 (×5): qty 1

## 2017-05-26 MED ORDER — FENTANYL CITRATE (PF) 100 MCG/2ML IJ SOLN
50.0000 ug | Freq: Once | INTRAMUSCULAR | Status: AC
  Administered 2017-05-26: 50 ug via INTRAVENOUS
  Filled 2017-05-26: qty 2

## 2017-05-26 MED ORDER — FENTANYL CITRATE (PF) 100 MCG/2ML IJ SOLN
25.0000 ug | INTRAMUSCULAR | Status: DC | PRN
  Administered 2017-05-26 – 2017-05-27 (×4): 25 ug via INTRAVENOUS
  Filled 2017-05-26 (×4): qty 2

## 2017-05-26 MED ORDER — FINASTERIDE 5 MG PO TABS
5.0000 mg | ORAL_TABLET | Freq: Every day | ORAL | Status: DC
  Administered 2017-05-27 – 2017-06-02 (×7): 5 mg via ORAL
  Filled 2017-05-26 (×7): qty 1

## 2017-05-26 MED ORDER — ONDANSETRON 4 MG PO TBDP: 4.0000 mg | ORAL_TABLET | Freq: Four times a day (QID) | ORAL | Status: DC | PRN

## 2017-05-26 MED ORDER — IPRATROPIUM BROMIDE 0.06 % NA SOLN
2.0000 | Freq: Three times a day (TID) | NASAL | Status: DC | PRN
  Filled 2017-05-26: qty 15

## 2017-05-26 MED ORDER — CARVEDILOL 25 MG PO TABS
25.0000 mg | ORAL_TABLET | Freq: Two times a day (BID) | ORAL | Status: DC
  Administered 2017-05-27 – 2017-06-02 (×12): 25 mg via ORAL
  Filled 2017-05-26 (×13): qty 1

## 2017-05-26 MED ORDER — SODIUM CHLORIDE 0.9 % IV SOLN
INTRAVENOUS | Status: DC
  Administered 2017-05-27 – 2017-05-29 (×3): via INTRAVENOUS

## 2017-05-26 NOTE — ED Notes (Signed)
Bed: WA11 Expected date:  Expected time:  Means of arrival:  Comments: EMS-fall 

## 2017-05-26 NOTE — ED Provider Notes (Signed)
[facility] DEPT Provider Note   CSN: [id number] Arrival date & time: 05/26/17  1629     History   Chief Complaint Chief Complaint  Patient presents with  . Fall    HPI Frank Kramer is a 83 y.o. male.  The history is provided by the patient and a relative.  Fall  This is a new problem. The current episode started 1 to 2 hours ago. The problem occurs constantly. The problem has not changed since onset.Associated symptoms include chest pain and shortness of breath. Pertinent negatives include no abdominal pain and no headaches. The symptoms are aggravated by twisting. Nothing relieves the symptoms. He has tried nothing for the symptoms. The treatment provided no relief.    Past Medical History:  Diagnosis Date  . Arthritis   . Back pain 07/18/2014  . Basal cell carcinoma of face 07/20/2013   3 lesions excised Dr Link Snuffer approx  7/14  . C. difficile colitis   . CAD (coronary artery disease) 02/08/2009   LHC: dilated ectatic segment in the midportion of the LAD with mild to mod. narrowing just distal to this.  . Clostridium difficile infection   . GERD (gastroesophageal reflux disease)   . High cholesterol   . Hx of lymphoma, non-Hodgkins 07/19/2013  . Hypertension   . MGUS (monoclonal gammopathy of unknown significance) 06/07/2012  . Non Hodgkin's lymphoma ([place]) dx'd 1997  . Paget's bone disease   . Panic    panic disorder  . Rhinitis 06/07/2012  . Skin cancer     Patient Active Problem List   Diagnosis Date Noted  . Pancytopenia, acquired ([place]) 08/13/2016  . Weight loss 08/13/2016  . Chronic diarrhea 08/13/2016  . Acute lower GI bleeding 04/19/2016  . Dyslipidemia 05/10/2015  . Midline low back pain without sciatica 08/09/2014  . Sinusitis 08/09/2014  . Back pain 07/18/2014  . Thrombocytopenia, unspecified ([place]) 07/18/2014  . Palpitations 02/18/2014  . Basal cell carcinoma of face 07/20/2013  . Hx of lymphoma, non-Hodgkins 07/19/2013  . Acute blood loss anemia  12/15/2012  . C. difficile colitis 12/10/2012  . Dehydration 12/10/2012  . Orthostatic hypotension 12/10/2012  . Paget's bone disease 12/04/2012  . Colitis 12/04/2012  . LGI bleed 12/04/2012  . MGUS (monoclonal gammopathy of unknown significance) 06/07/2012  . Rhinitis 06/07/2012  . Rectal bleeding 11/21/2011  . HTN (hypertension) 11/21/2011  . GERD (gastroesophageal reflux disease) 11/21/2011  . Anxiety disorder 11/21/2011  . Arthritis 11/21/2011  . Anemia in neoplastic disease 11/21/2011    Past Surgical History:  Procedure Laterality Date  . BACK SURGERY    . CARDIAC CATHETERIZATION  02/08/2009   nonocclusive CAD  . COLONOSCOPY N/A 12/15/2012   Procedure: COLONOSCOPY;  Surgeon: Missy Sabins, MD;  Location: [facility];  Service: Endoscopy;  Laterality: N/A;  . HERNIA REPAIR    . NECK SURGERY     c2  . PROSTATECTOMY    . SKIN BIOPSY         Home Medications    Prior to Admission medications   Medication Sig Start Date End Date Taking? Authorizing Provider  acetaminophen (TYLENOL) 500 MG tablet Take 500 mg by mouth every 6 (six) hours as needed for moderate pain.    [provider]  Acetaminophen-Caffeine (EXCEDRIN TENSION HEADACHE) 500-65 MG TABS Take 2 tablets by mouth every 6 (six) hours as needed (Headache).    [provider]  ALPRAZolam Duanne Moron) 0.5 MG tablet Take 1 tablet (0.5 mg total) by mouth 2 (two) times  daily as needed. anxiety Patient taking differently: Take 0.5 mg by mouth 2 (two) times daily as needed for anxiety.  12/15/12   Dellinger, Bobby Rumpf, PA-C  amLODipine (NORVASC) 10 MG tablet TAKE 0.5 TABLET (5 MG) BY MOUTH BID 02/07/17   [provider]  butalbital-acetaminophen-caffeine (FIORICET, ESGIC) 50-325-40 MG per tablet Take 1 tablet by mouth 2 (two) times daily as needed for headache. For headaches.    [provider]  carvedilol (COREG) 25 MG tablet TAKE 1 TABLET TWICE A DAY (NEED APPOINTMENT FOR FURTHER REFILLS, CALL  [phone]) 07/22/16   Hilty, Nadean Corwin, MD  cetirizine (ZYRTEC) 10 MG tablet Take 0.5 tablets (5 mg total) by mouth daily. 07/19/13   Annia Belt, MD  cholecalciferol (VITAMIN D) 1000 UNITS tablet Take 1,000 Units by mouth daily.    [provider]  enalapril (VASOTEC) 10 MG tablet Take 5 mg by mouth daily.     [provider]  ferrous sulfate 325 (65 FE) MG tablet Take 325 mg by mouth every other day.     [provider]  finasteride (PROSCAR) 5 MG tablet Take 5 mg by mouth daily. 02/14/14   [provider]  fluocinonide ointment (LIDEX) 0.16 % Apply 1 application topically 2 (two) times daily as needed (rash). rash     [provider]  HYDROcodone-acetaminophen (NORCO/VICODIN) 5-325 MG tablet Take 2 tablets by mouth every 12 (twelve) hours as needed for severe pain (do not take with tylenol). Patient taking differently: Take 1 tablet by mouth daily as needed for severe pain (do not take with tylenol).  07/15/16   Leo Grosser, MD  ipratropium (ATROVENT) 0.06 % nasal spray USE 2 SPRAYS IN EACH NOSTRIL THREE TIMES A DAY AS NEEDED FOR ALLERGIES 12/11/16   [provider]  lidocaine (LIDODERM) 5 % Place 1 patch onto the skin daily. Remove & Discard patch within 12 hours or as directed by MD Patient not taking: Reported on 04/17/2017 07/18/14   Heath Lark, MD  polyethylene glycol (MIRALAX / GLYCOLAX) packet Take 17 g by mouth daily as needed for moderate constipation.     [provider]  RAPAFLO 8 MG CAPS capsule Take 8 mg by mouth daily with breakfast.  04/02/17   [provider]    Family History Family History  Problem Relation Age of Onset  . Hypertension Mother   . Heart failure Mother   . Hypertension Father   . Cancer Brother   . Heart failure Sister     Social History Social History  Substance Use Topics  . Smoking status: Former Smoker    Quit date: 11/20/1968  . Smokeless tobacco: Never Used  . Alcohol  use No     Allergies   Aspirin; Ciprofloxacin; Codeine; Iohexol; Macrodantin; and Morphine and related   Review of Systems Review of Systems  Constitutional: Negative for chills, fatigue and fever.  HENT: Negative for congestion.   Eyes: Negative for visual disturbance.  Respiratory: Positive for chest tightness and shortness of breath. Negative for cough, wheezing and stridor.   Cardiovascular: Positive for chest pain. Negative for palpitations.  Gastrointestinal: Negative for abdominal pain, diarrhea, nausea and vomiting.  Genitourinary: Negative for dysuria.  Musculoskeletal: Positive for back pain and neck pain. Negative for neck stiffness.  Skin: Negative for rash and wound.  Neurological: Negative for seizures, light-headedness and headaches.  Psychiatric/Behavioral: Negative for agitation.  All other systems reviewed and are negative.    Physical Exam Updated Vital Signs BP Marland Kitchen)  162/66 (BP Location: Right Arm)   Pulse 61   Resp 18   Ht 5\' 3"  (1.6 m)   Wt 62.6 kg (138 lb)   SpO2 95%   BMI 24.45 kg/m   Physical Exam  Constitutional: He is oriented to person, place, and time. He appears well-developed and well-nourished. No distress.  HENT:  Head: Normocephalic.  Nose: Nose normal.  Mouth/Throat: Oropharynx is clear and moist. No oropharyngeal exudate.  Eyes: Pupils are equal, round, and reactive to light. Conjunctivae and EOM are normal.  Neck:  c collar in place   Cardiovascular: Normal rate and intact distal pulses.   No murmur heard. Pulmonary/Chest: Effort normal. No stridor. No respiratory distress. He has no wheezes. He exhibits tenderness.    Abdominal: Soft. He exhibits no distension. There is no tenderness.  Musculoskeletal: He exhibits tenderness. He exhibits no edema.       Thoracic back: He exhibits tenderness and pain.       Back:  Neurological: He is alert and oriented to person, place, and time. He is not disoriented. He displays no tremor.  No cranial nerve deficit or sensory deficit. He exhibits normal muscle tone. Coordination normal. GCS eye subscore is 4. GCS verbal subscore is 5. GCS motor subscore is 6.  Skin: Skin is warm. Capillary refill takes less than 2 seconds. He is not diaphoretic. No erythema.  Psychiatric: He has a normal mood and affect.  Nursing note and vitals reviewed.    ED Treatments / Results  Labs (all labs ordered are listed, but only abnormal results are displayed) Labs Reviewed  CBC WITH DIFFERENTIAL/PLATELET - Abnormal; Notable for the following:       Result Value   RBC 3.49 (*)    Hemoglobin 11.4 (*)    HCT 31.9 (*)    Platelets 119 (*)    All other components within normal limits  URINALYSIS, ROUTINE W REFLEX MICROSCOPIC - Abnormal; Notable for the following:    Hgb urine dipstick SMALL (*)    All other components within normal limits  I-STAT CHEM 8, ED - Abnormal; Notable for the following:    Sodium 133 (*)    Chloride 98 (*)    Glucose, Bld 110 (*)    Hemoglobin 10.2 (*)    HCT 30.0 (*)    All other components within normal limits  URINE CULTURE  MRSA PCR SCREENING  LIPASE, BLOOD  PROTIME-INR  TROPONIN I  MAGNESIUM  TSH  CBC  BASIC METABOLIC PANEL    EKG  EKG Interpretation  Date/Time:  Monday May 26 2017 18:21:09 EDT Ventricular Rate:  63 PR Interval:    QRS Duration: 121 QT Interval:  443 QTC Calculation: 454 R Axis:   -146 Text Interpretation:  Sinus or ectopic atrial rhythm Nonspecific intraventricular conduction delay Repol abnrm suggests ischemia, anterolateral T wave inversions in lead 1, AVL are new compared to prior.  No STEMI Confirmed by Antony Blackbird [phone] on 05/26/2017 6:27:07 PM       Radiology Ct Head Wo Contrast  Result Date: 05/26/2017 CLINICAL DATA:  Un witnessed fall today with headaches and neck pain, initial encounter EXAM: CT HEAD WITHOUT CONTRAST CT CERVICAL SPINE WITHOUT CONTRAST TECHNIQUE: Multidetector CT imaging of the head and  cervical spine was performed following the standard protocol without intravenous contrast. Multiplanar CT image reconstructions of the cervical spine were also generated. COMPARISON:  06/03/2011 FINDINGS: CT HEAD FINDINGS Brain: Mild atrophic changes are noted. Chronic white matter ischemic change is noted  as well. No findings to suggest acute hemorrhage, acute infarction or space-occupying mass lesion are noted. Vascular: No hyperdense vessel or unexpected calcification. Skull: Normal. Negative for fracture or focal lesion. Sinuses/Orbits: No acute finding. Other: None. CT CERVICAL SPINE FINDINGS Alignment: There changes consistent with prior fusion from C3 to C6 with anterior fixation from C4-C6. Very mild degenerative anterolisthesis of C2 on C3 is seen. Skull base and vertebrae: 7 cervical segments are noted. The previous fusion is noted. Multilevel osteophytic changes are seen. Facet hypertrophic changes are noted as well. No acute fracture is noted. Neural foraminal changes are noted particularly on the left secondary to the osteophytic change and neural foraminal narrowing. These are worst at C2-3. No acute fracture or acute facet abnormality is noted. Soft tissues and spinal canal: There is a 16 mm hypodensity within the right lobe of the thyroid. Diffuse carotid calcifications are seen. No other soft tissue abnormality is noted. Upper chest: Large left-sided pleural effusion is seen. Some subcutaneous air is also noted suggestive of pneumothorax. This will be better evaluated on upcoming CT of the chest. Other: None IMPRESSION: CT of the head: No acute intracranial abnormality noted. Chronic atrophic and ischemic changes are seen. CT of the cervical spine:  Multilevel fusion. Multilevel facet hypertrophic changes and neural foraminal changes as described worst on the left at C2-3. Large left pleural effusion and changes suggestive of underlying pneumothorax. This will be better evaluated on the upcoming CT  of the chest. Electronically Signed   By: Inez Catalina M.D.   On: 05/26/2017 19:57   Ct Chest Wo Contrast  Result Date: 05/26/2017 CLINICAL DATA:  83 year old male with back pain following unwitnessed fall earlier today EXAM: CT CHEST WITHOUT CONTRAST TECHNIQUE: Multidetector CT imaging of the chest was performed following the standard protocol without IV contrast. COMPARISON:  Prior thoracic spine MRI 07/21/2014; recent prior CT abdomen/ pelvis 04/17/2017 FINDINGS: Cardiovascular: Limited evaluation in the absence of intravenous contrast. Extensive atherosclerotic calcifications throughout the aorta. The heart is normal in size. Calcifications present throughout the coronary arteries. No pericardial effusion. Mediastinum/Nodes: Heterogeneous multinodular thyroid gland most consistent with goiter. No significant mediastinal adenopathy or mass. Lungs/Pleura: Intermediate attenuation left-sided pleural effusion most consistent with hemothorax. Trace pneumothorax. Dependent atelectasis in the left greater than right lower lobes. No suspicious mass or nodule. Upper Abdomen: Numerous circumscribed low-attenuation lesions in the liver and bilateral kidneys which are incompletely evaluated in the absence of intravenous contrast. The findings remain essentially unchanged compared to prior imaging and are therefore most consistent with simple cysts. Musculoskeletal: Acute fractures of left ribs 3, 4, 5, 6, 7, 8, and 9. There is a displaced comminuted fracture through the posterior aspect of left rib 8. Of note, the bony cortex of 1 of the bone fragments is located 1- 2 mm from the thoracic aorta. Remote healed right-sided rib fractures. Remote T11 compression fracture no new compression fracture identified. Subcutaneous emphysema noted tracking throughout the soft tissues of the posterolateral left neck and back. IMPRESSION: 1. Numerous acute left-sided rib fractures including left ribs 3, 4, 5, 6, 7, 8 and 9. The left  eighth rib fracture is comminuted and displaced, and 1 of the bony shards is directed toward and nearly abuts the descending thoracic aorta. 2. Associated left hemo pneumothorax. The pneumothorax component is tiny. There is a small volume of subcutaneous emphysema tracking through the soft tissues of the posterior left chest. 3. Chronic T11 compression fracture. No new thoracic compression fracture identified. 4. Coronary and  aortic atherosclerotic calcifications. 5. Heterogeneous multinodular thyroid goiter noted incidentally. Aortic Atherosclerosis (ICD10-I70.0). Electronically Signed   By: Jacqulynn Cadet M.D.   On: 05/26/2017 20:06   Ct Cervical Spine Wo Contrast  Result Date: 05/26/2017 CLINICAL DATA:  Un witnessed fall today with headaches and neck pain, initial encounter EXAM: CT HEAD WITHOUT CONTRAST CT CERVICAL SPINE WITHOUT CONTRAST TECHNIQUE: Multidetector CT imaging of the head and cervical spine was performed following the standard protocol without intravenous contrast. Multiplanar CT image reconstructions of the cervical spine were also generated. COMPARISON:  06/03/2011 FINDINGS: CT HEAD FINDINGS Brain: Mild atrophic changes are noted. Chronic white matter ischemic change is noted as well. No findings to suggest acute hemorrhage, acute infarction or space-occupying mass lesion are noted. Vascular: No hyperdense vessel or unexpected calcification. Skull: Normal. Negative for fracture or focal lesion. Sinuses/Orbits: No acute finding. Other: None. CT CERVICAL SPINE FINDINGS Alignment: There changes consistent with prior fusion from C3 to C6 with anterior fixation from C4-C6. Very mild degenerative anterolisthesis of C2 on C3 is seen. Skull base and vertebrae: 7 cervical segments are noted. The previous fusion is noted. Multilevel osteophytic changes are seen. Facet hypertrophic changes are noted as well. No acute fracture is noted. Neural foraminal changes are noted particularly on the left  secondary to the osteophytic change and neural foraminal narrowing. These are worst at C2-3. No acute fracture or acute facet abnormality is noted. Soft tissues and spinal canal: There is a 16 mm hypodensity within the right lobe of the thyroid. Diffuse carotid calcifications are seen. No other soft tissue abnormality is noted. Upper chest: Large left-sided pleural effusion is seen. Some subcutaneous air is also noted suggestive of pneumothorax. This will be better evaluated on upcoming CT of the chest. Other: None IMPRESSION: CT of the head: No acute intracranial abnormality noted. Chronic atrophic and ischemic changes are seen. CT of the cervical spine:  Multilevel fusion. Multilevel facet hypertrophic changes and neural foraminal changes as described worst on the left at C2-3. Large left pleural effusion and changes suggestive of underlying pneumothorax. This will be better evaluated on the upcoming CT of the chest. Electronically Signed   By: Inez Catalina M.D.   On: 05/26/2017 19:57    Procedures Procedures (including critical care time)  Medications Ordered in ED Medications  amLODipine (NORVASC) tablet 10 mg (not administered)  ipratropium (ATROVENT) 0.06 % nasal spray 2 spray (not administered)  carvedilol (COREG) tablet 25 mg (not administered)  polyethylene glycol (MIRALAX / GLYCOLAX) packet 17 g (not administered)  finasteride (PROSCAR) tablet 5 mg (not administered)  enalapril (VASOTEC) tablet 10 mg (not administered)  ALPRAZolam (XANAX) tablet 0.5 mg (not administered)  0.9 %  sodium chloride infusion (not administered)  acetaminophen (TYLENOL) tablet 650 mg (not administered)  ondansetron (ZOFRAN-ODT) disintegrating tablet 4 mg (not administered)    Or  ondansetron (ZOFRAN) injection 4 mg (not administered)  hydrALAZINE (APRESOLINE) injection 10 mg (not administered)  pantoprazole (PROTONIX) EC tablet 40 mg (not administered)    Or  pantoprazole (PROTONIX) injection 40 mg (not  administered)  fentaNYL (SUBLIMAZE) injection 25 mcg (25 mcg Intravenous Given 05/26/17 2349)  traMADol (ULTRAM) tablet 50 mg (not administered)  methocarbamol (ROBAXIN) 500 mg in dextrose 5 % 50 mL IVPB (not administered)  fentaNYL (SUBLIMAZE) injection 50 mcg (50 mcg Intravenous Given 05/26/17 1842)  fentaNYL (SUBLIMAZE) injection 50 mcg (50 mcg Intravenous Given 05/26/17 2248)     Initial Impression / Assessment and Plan / ED Course  I have reviewed  the triage vital signs and the nursing notes.  Pertinent labs & imaging results that were available during my care of the patient were reviewed by me and considered in my medical decision making (see chart for details).     SUSUMU HACKLER is a 83 y.o. male with a past medical history significant for hypertension, CAD, GERD, prior lymphoma, lower GI bleed, diverticulitis, and Paget's disease of the bone who presents with a fall and difficulty speaking. Patient to come in by family report that for the last 2 days, patient has difficulty speaking. They report that he has had slurred speech and a difficult time getting out what he wants to say. They report he is also been disoriented. They say this is new for him. They report that today, patient had his walker next to the bed but got up and did not use it. He then walked around the bed and lost balance falling to the ground. He denies loss of consciousness. He is complaining of severe left chest pain, left back pain, mid back pain, left neck pain, and left posterior headache. He denies vision changes, nausea, or vomiting. He denies preceding fevers, chills, chest pain, palpitations, or cough. Therefore he chronically has rhinorrhea and congestion. Patient denies any difficulty with urination or bowel movement changes edges constipation, diarrhea, or dark tarry stools. Family thinks is slightly more pale than normal.  Patient denies taking any new medications or having any medication changes. He denies any  other complaints.  On exam, patient is extremely tender in his left chest, left lateral chest wall, and left back. He is also tender in the thoracic spine, left neck, and left occiput. No evidence of laceration seen. Patient is having pain with inspiration. Lungs were coarse on the left side compared to the right. Abdomen was nontender. No low back tenderness. Patient has no leg swelling and had normal sensation and pulses in his lower extremity. Symmetric grip strength and sensation in finger-nose-finger. Full extraocular movements. Normal facial sensation with no facial droop. Patient did not have slurred speech on my examination and was oriented.  Due to patient's slurred speech by family report and confusion for the last 2 days, he will have a CT of the head and laboratory testing to look for occult infection or possible stroke. For the falls, he will have imaging of the chest, back, and neck and head.  Anticipate reassessment following workup. Patient will be given pain medicine initially.   Patient's CT imaging revealed no evidence of intracranial injury or cervical spine injury. CT of the chest did show concern for significant traumatic injury and left chest. Imaging showed 7 rib fractures and hemopneumothorax.  After this discovery, patient was placed on 2 L nasal cannula for respiratory comfort.  Trauma was called for admission and recommended. Trauma recommended admission to the stepdown unit for close pulmonary observation as patient may develop contusion and worsening respiratory status over time given his age. Patient and family were advised to admission and agreed. Patient was given more pain medicine.  Patient reassessed just prior to transfer and continued to appear stable. Patient on 2 L nasal cannula with good oxygen saturations. Pain is under better control with fentanyl. Patient transferred in stable condition to stepdown unit for admission at [facility].    Final Clinical  Impressions(s) / ED Diagnoses   Final diagnoses:  Hemopneumothorax on left  Fall, initial encounter  Closed fracture of multiple ribs of left side, initial encounter    New  Prescriptions Current Discharge Medication List      Clinical Impression: 1. Hemopneumothorax on left   2. Fall, initial encounter   3. Closed fracture of multiple ribs of left side, initial encounter     Disposition: Admit to Trauma service    Gabrelle Roca, Gwenyth Allegra, MD 05/27/17 [phone]

## 2017-05-26 NOTE — ED Triage Notes (Addendum)
Per EMs , pt. From home complaint of unwitnessed fall at around 4 this afternoon. Pt. Was walking around his bed ,lost balance and fell backward ,denied LOC. pt. Complaint back pain at 10/10, no injury /deformity  Reported. Pt. Not on blood thinner. Per pt.'s daughter , pt. Had frequent falls lately and was seen by his Primary MD. Pt. Alert and oriented x2.

## 2017-05-27 ENCOUNTER — Inpatient Hospital Stay (HOSPITAL_COMMUNITY): Payer: Medicare Other

## 2017-05-27 LAB — CBC
HCT: 31.3 % — ABNORMAL LOW (ref 39.0–52.0)
Hemoglobin: 11.1 g/dL — ABNORMAL LOW (ref 13.0–17.0)
MCH: 32.2 pg (ref 26.0–34.0)
MCHC: 35.5 g/dL (ref 30.0–36.0)
MCV: 90.7 fL (ref 78.0–100.0)
PLATELETS: 132 10*3/uL — AB (ref 150–400)
RBC: 3.45 MIL/uL — AB (ref 4.22–5.81)
RDW: 13.3 % (ref 11.5–15.5)
WBC: 7 10*3/uL (ref 4.0–10.5)

## 2017-05-27 LAB — BASIC METABOLIC PANEL
Anion gap: 8 (ref 5–15)
BUN: 14 mg/dL (ref 6–20)
CO2: 24 mmol/L (ref 22–32)
CREATININE: 1.14 mg/dL (ref 0.61–1.24)
Calcium: 9.2 mg/dL (ref 8.9–10.3)
Chloride: 99 mmol/L — ABNORMAL LOW (ref 101–111)
GFR calc Af Amer: >60 mL/min (ref >60)
GFR, EST NON AFRICAN AMERICAN: 53 mL/min — AB (ref >60)
GLUCOSE: 129 mg/dL — AB (ref 65–99)
POTASSIUM: 4.2 mmol/L (ref 3.5–5.1)
Sodium: 131 mmol/L — ABNORMAL LOW (ref 135–145)

## 2017-05-27 LAB — MRSA PCR SCREENING: MRSA by PCR: NEGATIVE

## 2017-05-27 MED ORDER — ORAL CARE MOUTH RINSE
15.0000 mL | Freq: Two times a day (BID) | OROMUCOSAL | Status: DC
  Administered 2017-05-27 – 2017-06-02 (×12): 15 mL via OROMUCOSAL

## 2017-05-27 MED ORDER — METHOCARBAMOL 1000 MG/10ML IJ SOLN
1000.0000 mg | Freq: Three times a day (TID) | INTRAVENOUS | Status: DC
  Administered 2017-05-27 – 2017-05-28 (×3): 1000 mg via INTRAVENOUS
  Filled 2017-05-27 (×4): qty 10

## 2017-05-27 MED ORDER — ACETAMINOPHEN 500 MG PO TABS
500.0000 mg | ORAL_TABLET | ORAL | Status: DC
  Administered 2017-05-27 – 2017-06-02 (×37): 500 mg via ORAL
  Filled 2017-05-27 (×37): qty 1

## 2017-05-27 MED ORDER — TRAMADOL HCL 50 MG PO TABS
100.0000 mg | ORAL_TABLET | Freq: Four times a day (QID) | ORAL | Status: DC | PRN
  Administered 2017-05-27 – 2017-05-31 (×6): 100 mg via ORAL
  Filled 2017-05-27 (×6): qty 2

## 2017-05-27 NOTE — Progress Notes (Signed)
Pt w/ low UO this afternoon. Bladder scan showing 553. Spoke w/ covering trauma MD. Will place foley cath

## 2017-05-27 NOTE — Progress Notes (Signed)
OT Cancellation Note  Patient Details Name: Frank Kramer MRN: [id number] DOB: [birth year]   Cancelled Treatment:    Reason Eval/Treat Not Completed: Medical issues which prohibited therapy;Pain limiting ability to participate.  BP soft at this time, and pt with significant pain that RN states they just are getting in some control. Will reattempt.   Trinity Haun [place], OTR/L [phone]   Lucille Passy M 05/27/2017, 1:51 PM

## 2017-05-27 NOTE — Progress Notes (Signed)
PT Cancellation Note  Patient Details Name: Frank Kramer MRN: [id number] DOB: 28-Nov-1921   Cancelled Treatment:    Reason Eval/Treat Not Completed: Medical issues which prohibited therapy Holding PT evaluation today secondary to soft BP at this time, and pt with significant pain that RN states they just are getting in some control. Will reattempt   Akaila Rambo A Sharian Delia 05/27/2017, 2:42 PM Wray Kearns, [place], DPT [phone]

## 2017-05-27 NOTE — Progress Notes (Signed)
[facility] Progress Note     Subjective: CC: Pain in left ribs and left shoulder Patient having severe pain, not controlled. Patient unable to move LUE much due to pain. Denies abdominal pain, n/v.  UOP good. VSS.   Objective: Vital signs in last 24 hours: Temp:  [97.3 F (36.3 C)-97.8 F (36.6 C)] 97.3 F (36.3 C) (07/24 0442) Pulse Rate:  [61-101] 94 (07/24 0442) Resp:  [16-29] 17 (07/24 0600) BP: (104-162)/(61-89) 104/61 (07/24 0600) SpO2:  [94 %-100 %] 97 % (07/24 0400) FiO2 (%):  [0 %] 0 % (07/23 2334) Weight:  [62.6 kg (138 lb)] 62.6 kg (138 lb) (07/24 0442) Last BM Date: 05/25/17  Intake/Output from previous day: 07/23 0701 - 07/24 0700 In: 62.2 [I.V.:7.2; IV Piggyback:55] Out: 818 [Chest Tube:675] Intake/Output this shift: No intake/output data recorded.  PE: Gen:  Alert, obvious discomfort Card:  Regular rate and rhythm, pedal pulses 2+ BL Pulm:  Normal effort, clear to auscultation bilaterally Abd: Soft, non-tender, non-distended, bowel sounds present MSK: left shoulder TTP, no ecchymosis or edema. Grip 5/5 bilaterally. ROM in left shoulder limited due to pain Skin: warm and dry, no rashes; small abrasion to left knee Psych: A&Ox3   Lab Results:   Recent Labs  05/26/17 1702 05/26/17 1724 05/27/17 0301  WBC 4.9  --  7.0  HGB 11.4* 10.2* 11.1*  HCT 31.9* 30.0* 31.3*  PLT 119*  --  132*   BMET  Recent Labs  05/26/17 1724 05/27/17 0301  NA 133* 131*  K 3.7 4.2  CL 98* 99*  CO2  --  24  GLUCOSE 110* 129*  BUN 13 14  CREATININE 1.10 1.14  CALCIUM  --  9.2   PT/INR  Recent Labs  05/26/17 1702  LABPROT 14.1  INR 1.08   CMP     Component Value Date/Time   NA 131 (L) 05/27/2017 0301   NA 140 08/06/2016 1359   K 4.2 05/27/2017 0301   K 4.2 08/06/2016 1359   CL 99 (L) 05/27/2017 0301   CL 109 (H) 01/12/2013 0952   CO2 24 05/27/2017 0301   CO2 26 08/06/2016 1359   GLUCOSE 129 (H) 05/27/2017 0301   GLUCOSE 124 08/06/2016  1359   GLUCOSE 90 01/12/2013 0952   BUN 14 05/27/2017 0301   BUN 12.1 08/06/2016 1359   CREATININE 1.14 05/27/2017 0301   CREATININE 1.2 08/06/2016 1359   CALCIUM 9.2 05/27/2017 0301   CALCIUM 9.6 08/06/2016 1359   PROT 6.7 04/17/2017 1648   PROT 6.4 08/06/2016 1359   PROT 6.7 08/06/2016 1359   ALBUMIN 3.6 04/17/2017 1648   ALBUMIN 3.5 08/06/2016 1359   AST 21 04/17/2017 1648   AST 11 08/06/2016 1359   ALT 13 (L) 04/17/2017 1648   ALT 10 08/06/2016 1359   ALKPHOS 64 04/17/2017 1648   ALKPHOS 79 08/06/2016 1359   BILITOT 1.1 04/17/2017 1648   BILITOT 0.57 08/06/2016 1359   GFRNONAA 53 (L) 05/27/2017 0301   GFRAA >60 05/27/2017 0301   Lipase     Component Value Date/Time   LIPASE 31 05/26/2017 1702    Studies/Results: Ct Head Wo Contrast  Result Date: 05/26/2017 CLINICAL DATA:  Un witnessed fall today with headaches and neck pain, initial encounter EXAM: CT HEAD WITHOUT CONTRAST CT CERVICAL SPINE WITHOUT CONTRAST TECHNIQUE: Multidetector CT imaging of the head and cervical spine was performed following the standard protocol without intravenous contrast. Multiplanar CT image reconstructions of the cervical spine were also generated. COMPARISON:  06/03/2011 FINDINGS: CT HEAD FINDINGS Brain: Mild atrophic changes are noted. Chronic white matter ischemic change is noted as well. No findings to suggest acute hemorrhage, acute infarction or space-occupying mass lesion are noted. Vascular: No hyperdense vessel or unexpected calcification. Skull: Normal. Negative for fracture or focal lesion. Sinuses/Orbits: No acute finding. Other: None. CT CERVICAL SPINE FINDINGS Alignment: There changes consistent with prior fusion from C3 to C6 with anterior fixation from C4-C6. Very mild degenerative anterolisthesis of C2 on C3 is seen. Skull base and vertebrae: 7 cervical segments are noted. The previous fusion is noted. Multilevel osteophytic changes are seen. Facet hypertrophic changes are noted as  well. No acute fracture is noted. Neural foraminal changes are noted particularly on the left secondary to the osteophytic change and neural foraminal narrowing. These are worst at C2-3. No acute fracture or acute facet abnormality is noted. Soft tissues and spinal canal: There is a 16 mm hypodensity within the right lobe of the thyroid. Diffuse carotid calcifications are seen. No other soft tissue abnormality is noted. Upper chest: Large left-sided pleural effusion is seen. Some subcutaneous air is also noted suggestive of pneumothorax. This will be better evaluated on upcoming CT of the chest. Other: None IMPRESSION: CT of the head: No acute intracranial abnormality noted. Chronic atrophic and ischemic changes are seen. CT of the cervical spine:  Multilevel fusion. Multilevel facet hypertrophic changes and neural foraminal changes as described worst on the left at C2-3. Large left pleural effusion and changes suggestive of underlying pneumothorax. This will be better evaluated on the upcoming CT of the chest. Electronically Signed   By: Inez Catalina M.D.   On: 05/26/2017 19:57   Ct Chest Wo Contrast  Result Date: 05/26/2017 CLINICAL DATA:  83 year old male with back pain following unwitnessed fall earlier today EXAM: CT CHEST WITHOUT CONTRAST TECHNIQUE: Multidetector CT imaging of the chest was performed following the standard protocol without IV contrast. COMPARISON:  Prior thoracic spine MRI 07/21/2014; recent prior CT abdomen/ pelvis 04/17/2017 FINDINGS: Cardiovascular: Limited evaluation in the absence of intravenous contrast. Extensive atherosclerotic calcifications throughout the aorta. The heart is normal in size. Calcifications present throughout the coronary arteries. No pericardial effusion. Mediastinum/Nodes: Heterogeneous multinodular thyroid gland most consistent with goiter. No significant mediastinal adenopathy or mass. Lungs/Pleura: Intermediate attenuation left-sided pleural effusion most  consistent with hemothorax. Trace pneumothorax. Dependent atelectasis in the left greater than right lower lobes. No suspicious mass or nodule. Upper Abdomen: Numerous circumscribed low-attenuation lesions in the liver and bilateral kidneys which are incompletely evaluated in the absence of intravenous contrast. The findings remain essentially unchanged compared to prior imaging and are therefore most consistent with simple cysts. Musculoskeletal: Acute fractures of left ribs 3, 4, 5, 6, 7, 8, and 9. There is a displaced comminuted fracture through the posterior aspect of left rib 8. Of note, the bony cortex of 1 of the bone fragments is located 1- 2 mm from the thoracic aorta. Remote healed right-sided rib fractures. Remote T11 compression fracture no new compression fracture identified. Subcutaneous emphysema noted tracking throughout the soft tissues of the posterolateral left neck and back. IMPRESSION: 1. Numerous acute left-sided rib fractures including left ribs 3, 4, 5, 6, 7, 8 and 9. The left eighth rib fracture is comminuted and displaced, and 1 of the bony shards is directed toward and nearly abuts the descending thoracic aorta. 2. Associated left hemo pneumothorax. The pneumothorax component is tiny. There is a small volume of subcutaneous emphysema tracking through the soft tissues of  the posterior left chest. 3. Chronic T11 compression fracture. No new thoracic compression fracture identified. 4. Coronary and aortic atherosclerotic calcifications. 5. Heterogeneous multinodular thyroid goiter noted incidentally. Aortic Atherosclerosis (ICD10-I70.0). Electronically Signed   By: Jacqulynn Cadet M.D.   On: 05/26/2017 20:06   Ct Cervical Spine Wo Contrast  Result Date: 05/26/2017 CLINICAL DATA:  Un witnessed fall today with headaches and neck pain, initial encounter EXAM: CT HEAD WITHOUT CONTRAST CT CERVICAL SPINE WITHOUT CONTRAST TECHNIQUE: Multidetector CT imaging of the head and cervical spine was  performed following the standard protocol without intravenous contrast. Multiplanar CT image reconstructions of the cervical spine were also generated. COMPARISON:  06/03/2011 FINDINGS: CT HEAD FINDINGS Brain: Mild atrophic changes are noted. Chronic white matter ischemic change is noted as well. No findings to suggest acute hemorrhage, acute infarction or space-occupying mass lesion are noted. Vascular: No hyperdense vessel or unexpected calcification. Skull: Normal. Negative for fracture or focal lesion. Sinuses/Orbits: No acute finding. Other: None. CT CERVICAL SPINE FINDINGS Alignment: There changes consistent with prior fusion from C3 to C6 with anterior fixation from C4-C6. Very mild degenerative anterolisthesis of C2 on C3 is seen. Skull base and vertebrae: 7 cervical segments are noted. The previous fusion is noted. Multilevel osteophytic changes are seen. Facet hypertrophic changes are noted as well. No acute fracture is noted. Neural foraminal changes are noted particularly on the left secondary to the osteophytic change and neural foraminal narrowing. These are worst at C2-3. No acute fracture or acute facet abnormality is noted. Soft tissues and spinal canal: There is a 16 mm hypodensity within the right lobe of the thyroid. Diffuse carotid calcifications are seen. No other soft tissue abnormality is noted. Upper chest: Large left-sided pleural effusion is seen. Some subcutaneous air is also noted suggestive of pneumothorax. This will be better evaluated on upcoming CT of the chest. Other: None IMPRESSION: CT of the head: No acute intracranial abnormality noted. Chronic atrophic and ischemic changes are seen. CT of the cervical spine:  Multilevel fusion. Multilevel facet hypertrophic changes and neural foraminal changes as described worst on the left at C2-3. Large left pleural effusion and changes suggestive of underlying pneumothorax. This will be better evaluated on the upcoming CT of the chest.  Electronically Signed   By: Inez Catalina M.D.   On: 05/26/2017 19:57   Dg Chest Port 1 View  Result Date: 05/27/2017 CLINICAL DATA:  Chest tube placement EXAM: PORTABLE CHEST 1 VIEW COMPARISON:  Chest CT 05/26/2017 FINDINGS: The pigtail tip of the left-sided chest tube is at the medial left lung base. Multiple left-sided rib fractures are again seen. No visible pneumothorax. Decreased pleural fluid compared to the prior study. The right lung is clear. Extensive aortic atherosclerotic calcification. IMPRESSION: 1. Left-sided pigtail chest tube tip at the medial lung bases. No visible residual pneumothorax or pleural fluid/blood. 2.  Aortic Atherosclerosis (ICD10-I70.0). Electronically Signed   By: Ulyses Jarred M.D.   On: 05/27/2017 01:58    Anti-infectives: Anti-infectives    None       Assessment/Plan Fall from ground level L rib 3-9 fractures with large hemothorax - continue chest tube - 720 cc serosanguinous/bloody  - recheck CBC in AM, hgb 11.1 this AM - pain control - increased and scheduled robaxin, scheduled tylenol, increased tramadol to 100 mg q6 PRN. May need to consider PCA if this does not help. Will also discuss with MD whether epidural might benefit patient  - pulm toilet, IS - PT/OT L shoulder pain -  plain films ordered  HTN - home meds Anxiety - home xanax  FEN - regular  VTE - SCDs, no lovenox due to hemothorax ID - no abx  Dispo: Pain control. Continue chest tube, check CXR tomorrow AM. PT/OT.  LOS: 1 day    Brigid Re , [facility] 05/27/2017, 7:12 AM Pager: [phone] Trauma Pager: [phone] Mon-Fri 7:00 am-4:30 pm Sat-Sun 7:00 am-11:30 am

## 2017-05-27 NOTE — H&P (Signed)
Frank Kramer is an 83 y.o. male.   Chief Complaint: L rib pain HPI: Frank Kramer was at home walking by his bed when he lost his footing. He fell and struck his left ribs on a table. He was evaluated at the [facility] long emergency department. He was found to have multiple left-sided rib fractures with hemopneumothorax. I accepted him in transfer for admission to the trauma service.  Past Medical History:  Diagnosis Date  . Arthritis   . Back pain 07/18/2014  . Basal cell carcinoma of face 07/20/2013   3 lesions excised Dr Link Snuffer approx  7/14  . C. difficile colitis   . CAD (coronary artery disease) 02/08/2009   LHC: dilated ectatic segment in the midportion of the LAD with mild to mod. narrowing just distal to this.  . Clostridium difficile infection   . GERD (gastroesophageal reflux disease)   . High cholesterol   . Hx of lymphoma, non-Hodgkins 07/19/2013  . Hypertension   . MGUS (monoclonal gammopathy of unknown significance) 06/07/2012  . Non Hodgkin's lymphoma ([place]) dx'd 1997  . Paget's bone disease   . Panic    panic disorder  . Rhinitis 06/07/2012  . Skin cancer     Past Surgical History:  Procedure Laterality Date  . BACK SURGERY    . CARDIAC CATHETERIZATION  02/08/2009   nonocclusive CAD  . COLONOSCOPY N/A 12/15/2012   Procedure: COLONOSCOPY;  Surgeon: Missy Sabins, MD;  Location: [place];  Service: Endoscopy;  Laterality: N/A;  . HERNIA REPAIR    . NECK SURGERY     c2  . PROSTATECTOMY    . SKIN BIOPSY      Family History  Problem Relation Age of Onset  . Hypertension Mother   . Heart failure Mother   . Hypertension Father   . Cancer Brother   . Heart failure Sister    Social History:  reports that he quit smoking about 48 years ago. He has never used smokeless tobacco. He reports that he does not drink alcohol or use drugs.  Allergies:  Allergies  Allergen Reactions  . Aspirin     GI bleeding  . Ciprofloxacin Other (See Comments)    Caused GI bleed  . Codeine  Other (See Comments)    Patient cannot recall reaction  . Iohexol      Desc: HAS SEIZURES WITH X-RAY DYE-GIVEN 120 MG SOLU0MEDROL, 25 MG BENADRYL, AND 25 MG PEPCID 1 HOUR PRIOR TO EXAM AND HAD NO PROBLEMS-ARS-08/15/07   . Macrodantin Other (See Comments)    Patient cannot recall reaction  . Morphine And Related Itching    Medications Prior to Admission  Medication Sig Dispense Refill  . acetaminophen (TYLENOL) 500 MG tablet Take 500 mg by mouth every 6 (six) hours as needed for moderate pain.    . Acetaminophen-Caffeine (EXCEDRIN TENSION HEADACHE) 500-65 MG TABS Take 2 tablets by mouth every 6 (six) hours as needed (Headache).    . ALPRAZolam (XANAX) 0.5 MG tablet Take 1 tablet (0.5 mg total) by mouth 2 (two) times daily as needed. anxiety (Patient taking differently: Take 0.5 mg by mouth at bedtime as needed for anxiety. ) 20 tablet 0  . amLODipine (NORVASC) 10 MG tablet TAKE 0.5 TABLET (5 MG) BY MOUTH BID  0  . butalbital-acetaminophen-caffeine (FIORICET, ESGIC) 50-325-40 MG per tablet Take 1 tablet by mouth 2 (two) times daily as needed for headache. For headaches.    . carvedilol (COREG) 25 MG tablet TAKE 1 TABLET TWICE  A DAY (NEED APPOINTMENT FOR FURTHER REFILLS, CALL [phone]) 60 tablet 3  . cholecalciferol (VITAMIN D) 1000 UNITS tablet Take 1,000 Units by mouth daily.    . enalapril (VASOTEC) 10 MG tablet Take 10 mg by mouth 2 (two) times daily.     . finasteride (PROSCAR) 5 MG tablet Take 5 mg by mouth daily.    . fluocinonide ointment (LIDEX) 2.75 % Apply 1 application topically 2 (two) times daily as needed (rash). rash     . ipratropium (ATROVENT) 0.06 % nasal spray USE 2 SPRAYS IN EACH NOSTRIL THREE TIMES A DAY AS NEEDED FOR ALLERGIES    . lidocaine (LIDODERM) 5 % Place 1 patch onto the skin daily. Remove & Discard patch within 12 hours or as directed by MD 30 patch 0  . polyethylene glycol (MIRALAX / GLYCOLAX) packet Take 17 g by mouth daily as needed for moderate  constipation.     Marland Kitchen RAPAFLO 8 MG CAPS capsule Take 8 mg by mouth daily with breakfast.     . cetirizine (ZYRTEC) 10 MG tablet Take 0.5 tablets (5 mg total) by mouth daily. (Patient not taking: Reported on 05/26/2017) 30 tablet 1  . HYDROcodone-acetaminophen (NORCO/VICODIN) 5-325 MG tablet Take 2 tablets by mouth every 12 (twelve) hours as needed for severe pain (do not take with tylenol). (Patient not taking: Reported on 05/26/2017) 6 tablet 0    Results for orders placed or performed during the hospital encounter of 05/26/17 (from the past 48 hour(s))  CBC with Differential     Status: Abnormal   Collection Time: 05/26/17  5:02 PM  Result Value Ref Range   WBC 4.9 4.0 - 10.5 K/uL   RBC 3.49 (L) 4.22 - 5.81 MIL/uL   Hemoglobin 11.4 (L) 13.0 - 17.0 g/dL   HCT 31.9 (L) 39.0 - 52.0 %   MCV 91.4 78.0 - 100.0 fL   MCH 32.7 26.0 - 34.0 pg   MCHC 35.7 30.0 - 36.0 g/dL   RDW 13.3 11.5 - 15.5 %   Platelets 119 (L) 150 - 400 K/uL    Comment: CONSISTENT WITH PREVIOUS RESULT   Neutrophils Relative % 66 %   Neutro Abs 3.2 1.7 - 7.7 K/uL   Lymphocytes Relative 22 %   Lymphs Abs 1.1 0.7 - 4.0 K/uL   Monocytes Relative 9 %   Monocytes Absolute 0.4 0.1 - 1.0 K/uL   Eosinophils Relative 3 %   Eosinophils Absolute 0.1 0.0 - 0.7 K/uL   Basophils Relative 1 %   Basophils Absolute 0.0 0.0 - 0.1 K/uL  Urinalysis, Routine w reflex microscopic     Status: Abnormal   Collection Time: 05/26/17  5:02 PM  Result Value Ref Range   Color, Urine YELLOW YELLOW   APPearance CLEAR CLEAR   Specific Gravity, Urine 1.010 1.005 - 1.030   pH 6.0 5.0 - 8.0   Glucose, UA NEGATIVE NEGATIVE mg/dL   Hgb urine dipstick SMALL (A) NEGATIVE   Bilirubin Urine NEGATIVE NEGATIVE   Ketones, ur NEGATIVE NEGATIVE mg/dL   Protein, ur NEGATIVE NEGATIVE mg/dL   Nitrite NEGATIVE NEGATIVE   Leukocytes, UA NEGATIVE NEGATIVE   RBC / HPF 0-5 0 - 5 RBC/hpf   WBC, UA 0-5 0 - 5 WBC/hpf   Bacteria, UA NONE SEEN NONE SEEN   Squamous  Epithelial / LPF NONE SEEN NONE SEEN   Mucous PRESENT   Lipase, blood     Status: None   Collection Time: 05/26/17  5:02 PM  Result  Value Ref Range   Lipase 31 11 - 51 U/L  Protime-INR     Status: None   Collection Time: 05/26/17  5:02 PM  Result Value Ref Range   Prothrombin Time 14.1 11.4 - 15.2 seconds   INR 1.08   Troponin I     Status: None   Collection Time: 05/26/17  5:02 PM  Result Value Ref Range   Troponin I <0.03 <0.03 ng/mL  Magnesium     Status: None   Collection Time: 05/26/17  5:02 PM  Result Value Ref Range   Magnesium 1.9 1.7 - 2.4 mg/dL  I-Stat Chem 8, ED     Status: Abnormal   Collection Time: 05/26/17  5:24 PM  Result Value Ref Range   Sodium 133 (L) 135 - 145 mmol/L   Potassium 3.7 3.5 - 5.1 mmol/L   Chloride 98 (L) 101 - 111 mmol/L   BUN 13 6 - 20 mg/dL   Creatinine, Ser 1.10 0.61 - 1.24 mg/dL   Glucose, Bld 110 (H) 65 - 99 mg/dL   Calcium, Ion 1.22 1.15 - 1.40 mmol/L   TCO2 24 0 - 100 mmol/L   Hemoglobin 10.2 (L) 13.0 - 17.0 g/dL   HCT 30.0 (L) 39.0 - 52.0 %  TSH     Status: None   Collection Time: 05/26/17  6:40 PM  Result Value Ref Range   TSH 0.830 0.350 - 4.500 uIU/mL    Comment: Performed by a 3rd Generation assay with a functional sensitivity of <=0.01 uIU/mL.   Ct Head Wo Contrast  Result Date: 05/26/2017 CLINICAL DATA:  Un witnessed fall today with headaches and neck pain, initial encounter EXAM: CT HEAD WITHOUT CONTRAST CT CERVICAL SPINE WITHOUT CONTRAST TECHNIQUE: Multidetector CT imaging of the head and cervical spine was performed following the standard protocol without intravenous contrast. Multiplanar CT image reconstructions of the cervical spine were also generated. COMPARISON:  06/03/2011 FINDINGS: CT HEAD FINDINGS Brain: Mild atrophic changes are noted. Chronic white matter ischemic change is noted as well. No findings to suggest acute hemorrhage, acute infarction or space-occupying mass lesion are noted. Vascular: No hyperdense vessel  or unexpected calcification. Skull: Normal. Negative for fracture or focal lesion. Sinuses/Orbits: No acute finding. Other: None. CT CERVICAL SPINE FINDINGS Alignment: There changes consistent with prior fusion from C3 to C6 with anterior fixation from C4-C6. Very mild degenerative anterolisthesis of C2 on C3 is seen. Skull base and vertebrae: 7 cervical segments are noted. The previous fusion is noted. Multilevel osteophytic changes are seen. Facet hypertrophic changes are noted as well. No acute fracture is noted. Neural foraminal changes are noted particularly on the left secondary to the osteophytic change and neural foraminal narrowing. These are worst at C2-3. No acute fracture or acute facet abnormality is noted. Soft tissues and spinal canal: There is a 16 mm hypodensity within the right lobe of the thyroid. Diffuse carotid calcifications are seen. No other soft tissue abnormality is noted. Upper chest: Large left-sided pleural effusion is seen. Some subcutaneous air is also noted suggestive of pneumothorax. This will be better evaluated on upcoming CT of the chest. Other: None IMPRESSION: CT of the head: No acute intracranial abnormality noted. Chronic atrophic and ischemic changes are seen. CT of the cervical spine:  Multilevel fusion. Multilevel facet hypertrophic changes and neural foraminal changes as described worst on the left at C2-3. Large left pleural effusion and changes suggestive of underlying pneumothorax. This will be better evaluated on the upcoming CT of the chest.  Electronically Signed   By: Inez Catalina M.D.   On: 05/26/2017 19:57   Ct Chest Wo Contrast  Result Date: 05/26/2017 CLINICAL DATA:  83 year old male with back pain following unwitnessed fall earlier today EXAM: CT CHEST WITHOUT CONTRAST TECHNIQUE: Multidetector CT imaging of the chest was performed following the standard protocol without IV contrast. COMPARISON:  Prior thoracic spine MRI 07/21/2014; recent prior CT abdomen/  pelvis 04/17/2017 FINDINGS: Cardiovascular: Limited evaluation in the absence of intravenous contrast. Extensive atherosclerotic calcifications throughout the aorta. The heart is normal in size. Calcifications present throughout the coronary arteries. No pericardial effusion. Mediastinum/Nodes: Heterogeneous multinodular thyroid gland most consistent with goiter. No significant mediastinal adenopathy or mass. Lungs/Pleura: Intermediate attenuation left-sided pleural effusion most consistent with hemothorax. Trace pneumothorax. Dependent atelectasis in the left greater than right lower lobes. No suspicious mass or nodule. Upper Abdomen: Numerous circumscribed low-attenuation lesions in the liver and bilateral kidneys which are incompletely evaluated in the absence of intravenous contrast. The findings remain essentially unchanged compared to prior imaging and are therefore most consistent with simple cysts. Musculoskeletal: Acute fractures of left ribs 3, 4, 5, 6, 7, 8, and 9. There is a displaced comminuted fracture through the posterior aspect of left rib 8. Of note, the bony cortex of 1 of the bone fragments is located 1- 2 mm from the thoracic aorta. Remote healed right-sided rib fractures. Remote T11 compression fracture no new compression fracture identified. Subcutaneous emphysema noted tracking throughout the soft tissues of the posterolateral left neck and back. IMPRESSION: 1. Numerous acute left-sided rib fractures including left ribs 3, 4, 5, 6, 7, 8 and 9. The left eighth rib fracture is comminuted and displaced, and 1 of the bony shards is directed toward and nearly abuts the descending thoracic aorta. 2. Associated left hemo pneumothorax. The pneumothorax component is tiny. There is a small volume of subcutaneous emphysema tracking through the soft tissues of the posterior left chest. 3. Chronic T11 compression fracture. No new thoracic compression fracture identified. 4. Coronary and aortic  atherosclerotic calcifications. 5. Heterogeneous multinodular thyroid goiter noted incidentally. Aortic Atherosclerosis (ICD10-I70.0). Electronically Signed   By: Jacqulynn Cadet M.D.   On: 05/26/2017 20:06   Ct Cervical Spine Wo Contrast  Result Date: 05/26/2017 CLINICAL DATA:  Un witnessed fall today with headaches and neck pain, initial encounter EXAM: CT HEAD WITHOUT CONTRAST CT CERVICAL SPINE WITHOUT CONTRAST TECHNIQUE: Multidetector CT imaging of the head and cervical spine was performed following the standard protocol without intravenous contrast. Multiplanar CT image reconstructions of the cervical spine were also generated. COMPARISON:  06/03/2011 FINDINGS: CT HEAD FINDINGS Brain: Mild atrophic changes are noted. Chronic white matter ischemic change is noted as well. No findings to suggest acute hemorrhage, acute infarction or space-occupying mass lesion are noted. Vascular: No hyperdense vessel or unexpected calcification. Skull: Normal. Negative for fracture or focal lesion. Sinuses/Orbits: No acute finding. Other: None. CT CERVICAL SPINE FINDINGS Alignment: There changes consistent with prior fusion from C3 to C6 with anterior fixation from C4-C6. Very mild degenerative anterolisthesis of C2 on C3 is seen. Skull base and vertebrae: 7 cervical segments are noted. The previous fusion is noted. Multilevel osteophytic changes are seen. Facet hypertrophic changes are noted as well. No acute fracture is noted. Neural foraminal changes are noted particularly on the left secondary to the osteophytic change and neural foraminal narrowing. These are worst at C2-3. No acute fracture or acute facet abnormality is noted. Soft tissues and spinal canal: There is a 16 mm hypodensity  within the right lobe of the thyroid. Diffuse carotid calcifications are seen. No other soft tissue abnormality is noted. Upper chest: Large left-sided pleural effusion is seen. Some subcutaneous air is also noted suggestive of  pneumothorax. This will be better evaluated on upcoming CT of the chest. Other: None IMPRESSION: CT of the head: No acute intracranial abnormality noted. Chronic atrophic and ischemic changes are seen. CT of the cervical spine:  Multilevel fusion. Multilevel facet hypertrophic changes and neural foraminal changes as described worst on the left at C2-3. Large left pleural effusion and changes suggestive of underlying pneumothorax. This will be better evaluated on the upcoming CT of the chest. Electronically Signed   By: Inez Catalina M.D.   On: 05/26/2017 19:57    Review of Systems  Constitutional: Negative for chills and fever.  HENT: Positive for hearing loss.   Eyes: Negative for blurred vision.  Respiratory: Negative for shortness of breath.   Cardiovascular: Positive for chest pain.  Gastrointestinal: Negative for abdominal pain, nausea and vomiting.  Genitourinary: Negative.   Musculoskeletal: Positive for back pain.  Skin: Negative.   Neurological: Negative for loss of consciousness.  Endo/Heme/Allergies: Negative.   Psychiatric/Behavioral: Negative.     Blood pressure 124/68, pulse 98, temperature 97.8 F (36.6 C), temperature source Oral, resp. rate 19, height 5\' 3"  (1.6 m), weight 62.6 kg (138 lb), SpO2 98 %. Physical Exam  Constitutional: He is oriented to person, place, and time. He appears well-developed.  HENT:  Head: Normocephalic.  Right Ear: External ear normal.  Left Ear: External ear normal.  Mouth/Throat: Oropharynx is clear and moist.  Eyes: Pupils are equal, round, and reactive to light.  Neck: No tracheal deviation present. No thyromegaly present.  No posterior midline tenderness  Cardiovascular: Normal rate and normal heart sounds.   Respiratory: Effort normal. No respiratory distress. He has no wheezes. He exhibits tenderness.  Decreased breath sounds on left with left chest wall tenderness  GI: Soft. He exhibits no distension. There is no tenderness. There is  no rebound.  Musculoskeletal: Normal range of motion.  Small abrasion left knee  Neurological: He is alert and oriented to person, place, and time. He displays no atrophy and no tremor. He exhibits normal muscle tone. He displays no seizure activity. GCS eye subscore is 4. GCS verbal subscore is 5. GCS motor subscore is 6.  Skin: Skin is warm.  Psychiatric:  Anxious     Assessment/Plan Fall L rib FX 3-9 with large hemothorax, tiny pneumothorax - place chest tube, pulmonary toilet Admit to Katie, MD 05/27/2017, 1:13 AM

## 2017-05-27 NOTE — Plan of Care (Signed)
Problem: Education: Goal: Knowledge of [facility] Education information/materials will improve Outcome: Progressing Patient and family oriented to unit and hospital. Discussed plan of care and made aware of what we will do for the patient. Called family this morning to update them regarding plans for this morning.   Problem: Safety: Goal: Ability to remain free from injury will improve Outcome: Progressing Patient placed on low bed to prevent injury from fall. Patient has call light within reach and monitoring closely for needs. Patient is confused at times but not impulsive.   Problem: Health Behavior/Discharge Planning: Goal: Ability to manage health-related needs will improve Outcome: Progressing Patient lives with his daughter, but will be closely monitored. Will reassess for needs after discharge as appropriate.   Problem: Pain Managment: Goal: General experience of comfort will improve Outcome: Not Progressing Patient did not experience pain control on night shift. Made PA for trauma aware this morning prior to end of shift. Trauma team actively persuing ways to control patient's pain.   Problem: Physical Regulation: Goal: Ability to maintain clinical measurements within normal limits will improve Outcome: Progressing Patient had chest tube placed and hemothorax draining. Patient has put out about 700 ml of blood.   Problem: Skin Integrity: Goal: Risk for impaired skin integrity will decrease Outcome: Progressing Foam placed to backside and encouraging patient to all turns- although he is resistant d/t increased pain.   Problem: Tissue Perfusion: Goal: Risk factors for ineffective tissue perfusion will decrease Outcome: Progressing SCD's on.   Problem: Activity: Goal: Risk for activity intolerance will decrease Outcome: Progressing Patient on bedrest- but will be assessed by PT when appropriate.

## 2017-05-27 NOTE — Procedures (Signed)
Chest Tube Insertion Procedure Note  Indications:  Clinically significant L HPTX  Pre-operative Diagnosis: L HPTX  Post-operative Diagnosis: L HPTX  Procedure Details  Informed consent was obtained for the procedure, including sedation.  Risks of lung perforation, hemorrhage, arrhythmia, and adverse drug reaction were discussed.   After sterile skin prep, using standard technique, a 14 French tube was placed in the L anterior axillary line above the nipple level using Seldinger technique. It was hooked up to Pleur-evac.  Estimated Blood Loss:  500 initially out         Specimens:  None              Complications:  None; patient tolerated the procedure well.         Disposition: SDU         Condition: stable  Georganna Skeans, MD, MPH, FACS Trauma: [phone] General Surgery: [phone]

## 2017-05-27 NOTE — Progress Notes (Signed)
Pts SBP ranging mid 80s to low 90s last hour w/ last MAP 54. Relayed to covering trauma MD, Dr. Georgette Dover.  Per MD, continue to monitor for now.

## 2017-05-28 ENCOUNTER — Inpatient Hospital Stay (HOSPITAL_COMMUNITY): Payer: Medicare Other

## 2017-05-28 LAB — BASIC METABOLIC PANEL
Anion gap: 7 (ref 5–15)
BUN: 25 mg/dL — AB (ref 6–20)
CHLORIDE: 99 mmol/L — AB (ref 101–111)
CO2: 22 mmol/L (ref 22–32)
CREATININE: 1.55 mg/dL — AB (ref 0.61–1.24)
Calcium: 8.5 mg/dL — ABNORMAL LOW (ref 8.9–10.3)
GFR calc Af Amer: 43 mL/min — ABNORMAL LOW (ref >60)
GFR calc non Af Amer: 37 mL/min — ABNORMAL LOW (ref >60)
GLUCOSE: 90 mg/dL (ref 65–99)
POTASSIUM: 3.9 mmol/L (ref 3.5–5.1)
Sodium: 128 mmol/L — ABNORMAL LOW (ref 135–145)

## 2017-05-28 LAB — CBC
HCT: 26.1 % — ABNORMAL LOW (ref 39.0–52.0)
Hemoglobin: 9.3 g/dL — ABNORMAL LOW (ref 13.0–17.0)
MCH: 32.4 pg (ref 26.0–34.0)
MCHC: 35.6 g/dL (ref 30.0–36.0)
MCV: 90.9 fL (ref 78.0–100.0)
PLATELETS: 105 10*3/uL — AB (ref 150–400)
RBC: 2.87 MIL/uL — ABNORMAL LOW (ref 4.22–5.81)
RDW: 13.9 % (ref 11.5–15.5)
WBC: 6.9 10*3/uL (ref 4.0–10.5)

## 2017-05-28 LAB — URINE CULTURE

## 2017-05-28 MED ORDER — BETHANECHOL CHLORIDE 10 MG PO TABS
10.0000 mg | ORAL_TABLET | Freq: Three times a day (TID) | ORAL | Status: DC
  Administered 2017-05-28 – 2017-06-02 (×16): 10 mg via ORAL
  Filled 2017-05-28 (×21): qty 1

## 2017-05-28 MED ORDER — SODIUM CHLORIDE 1 G PO TABS
1.0000 g | ORAL_TABLET | Freq: Three times a day (TID) | ORAL | Status: DC
  Administered 2017-05-28 – 2017-06-02 (×14): 1 g via ORAL
  Filled 2017-05-28 (×17): qty 1

## 2017-05-28 MED ORDER — METHOCARBAMOL 500 MG PO TABS
1000.0000 mg | ORAL_TABLET | Freq: Three times a day (TID) | ORAL | Status: DC
  Administered 2017-05-28 – 2017-06-02 (×16): 1000 mg via ORAL
  Filled 2017-05-28 (×17): qty 2

## 2017-05-28 NOTE — Progress Notes (Signed)
Gave report to [facility], receiving RN 6N.  Pt to be transported w/ tele by bed.

## 2017-05-28 NOTE — Progress Notes (Signed)
Pt transferred to 6N by bed, oriented to room

## 2017-05-28 NOTE — Evaluation (Signed)
Physical Therapy Evaluation Patient Details Name: Frank Kramer MRN: [id number] DOB: October 30, 1922 Today's Date: 05/28/2017   History of Present Illness  This 83 y.o. male admitted 05/27/17 after a fall at home.  He sustained  Lt sided rib fractures (3-9)and  hemopneumothorax - chest tube placed.   PMH includes:  PMH includes back pain, C-diff, CAD. h/o lymphoma, HTN, panic disorder  Clinical Impression  Patient presents with problems listed below.  Will benefit from acute PT to maximize functional mobility prior to discharge.  Patient requiring total assist x2 for most mobility today.  Patient limited by pain.  Recommend SNF at discharge for continued therapy.    Follow Up Recommendations SNF;Supervision/Assistance - 24 hour    Equipment Recommendations  None recommended by PT    Recommendations for Other Services       Precautions / Restrictions Precautions Precautions: Fall Precaution Comments: Lt chest tube  Restrictions Weight Bearing Restrictions: No      Mobility  Bed Mobility Overal bed mobility: Needs Assistance Bed Mobility: Supine to Sit;Sit to Supine     Supine to sit: Total assist;+2 for physical assistance Sit to supine: Total assist;+2 for physical assistance   General bed mobility comments: Pt able to assist minimally with moving LEs toward EOB, and did attempt to lift trunk minimally.  Required very slow movements for transitions, with +2 total assist.  Transfers                 General transfer comment: unable to attempt due to pain   Ambulation/Gait                Stairs            Wheelchair Mobility    Modified Rankin (Stroke Patients Only)       Balance Overall balance assessment: Needs assistance Sitting-balance support: Feet supported Sitting balance-Leahy Scale: Zero Sitting balance - Comments: Pt sat EOB x 4 mins with total A to brief periods of max A  Postural control: Posterior lean;Right lateral lean                                   Pertinent Vitals/Pain Pain Assessment: Faces Faces Pain Scale: Hurts even more Pain Location: bil. hips and ribs  Pain Descriptors / Indicators: Grimacing;Guarding;Moaning Pain Intervention(s): Limited activity within patient's tolerance;Repositioned;Monitored during session    Home Living Family/patient expects to be discharged to:: Skilled nursing facility Living Arrangements: Children Available Help at Discharge: Family;Available 24 hours/day;Personal care attendant Type of Home: House Home Access: Ramped entrance     Home Layout: One level Home Equipment: [place] - 2 wheels;Bedside commode;Wheelchair - Sport and exercise psychologist Comments: lives with daughter.  Has a PCA 2 hours/day who assists with bathing and dressing     Prior Function Level of Independence: Needs assistance   Gait / Transfers Assistance Needed: ambulated with RW, but frequently forgot to use it, and supervision to min A.  H/o falls   ADL's / Homemaking Assistance Needed: PCA assists with ADLs         Hand Dominance   Dominant Hand: Right    Extremity/Trunk Assessment   Upper Extremity Assessment Upper Extremity Assessment: Defer to OT evaluation    Lower Extremity Assessment Lower Extremity Assessment: Generalized weakness (At least 3/5 - able to assist moving LE's off of bed)    Cervical / Trunk Assessment Cervical / Trunk Assessment: Kyphotic  Communication  Communication: No difficulties  Cognition Arousal/Alertness: Awake/alert Behavior During Therapy: WFL for tasks assessed/performed;Anxious Overall Cognitive Status: Impaired/Different from baseline Area of Impairment: Orientation;Attention;Memory;Following commands;Safety/judgement;Awareness;Problem solving                 Orientation Level: Disoriented to;Time;Place Current Attention Level: Sustained Memory: Decreased short-term memory Following Commands: Follows one step commands  consistently Safety/Judgement: Decreased awareness of deficits   Problem Solving: Slow processing;Decreased initiation;Difficulty sequencing;Requires verbal cues;Requires tactile cues General Comments: Pt preoccupied by pain so difficult to accurately assess.  Pt was noted to be disoriented , and looks to family frequently to assist with answering questions       General Comments General comments (skin integrity, edema, etc.): Vitals stable during session    Exercises     Assessment/Plan    PT Assessment Patient needs continued PT services  PT Problem List Decreased strength;Decreased range of motion;Decreased activity tolerance;Decreased balance;Decreased mobility;Decreased cognition;Decreased knowledge of use of DME;Pain       PT Treatment Interventions DME instruction;Gait training;Functional mobility training;Therapeutic activities;Therapeutic exercise;Balance training;Cognitive remediation;Patient/family education    PT Goals (Current goals can be found in the Care Plan section)  Acute Rehab PT Goals Patient Stated Goal: did not state  PT Goal Formulation: With patient/family Time For Goal Achievement: 06/11/17 Potential to Achieve Goals: Fair    Frequency Min 3X/week   Barriers to discharge        Co-evaluation PT/OT/SLP Co-Evaluation/Treatment: Yes Reason for Co-Treatment: Complexity of the patient's impairments (multi-system involvement);For patient/therapist safety PT goals addressed during session: Mobility/safety with mobility OT goals addressed during session: ADL's and self-care       AM-PAC PT "6 Clicks" Daily Activity  Outcome Measure Difficulty turning over in bed (including adjusting bedclothes, sheets and blankets)?: Total Difficulty moving from lying on back to sitting on the side of the bed? : Total Difficulty sitting down on and standing up from a chair with arms (e.g., wheelchair, bedside commode, etc,.)?: Total Help needed moving to and from a  bed to chair (including a wheelchair)?: Total Help needed walking in hospital room?: Total Help needed climbing 3-5 steps with a railing? : Total 6 Click Score: 6    End of Session   Activity Tolerance: Patient limited by pain Patient left: in bed;with call bell/phone within reach;with family/visitor present;with SCD's reapplied Nurse Communication: Mobility status PT Visit Diagnosis: Muscle weakness (generalized) (M62.81);History of falling (Z91.81);Difficulty in walking, not elsewhere classified (R26.2);Pain Pain - Right/Left: Left Pain - part of body:  (Lt ribs and bil hips)    Time: 1224-8250 PT Time Calculation (min) (ACUTE ONLY): 36 min   Charges:   PT Evaluation $PT Eval High Complexity: 1 Procedure     PT G Codes:        Carita Pian. Sanjuana Kava, [facility] Pager [phone]   Despina Pole 05/28/2017, 2:33 PM

## 2017-05-28 NOTE — Progress Notes (Signed)
[facility] Progress Note     Subjective: CC: confusion Patient confused this AM, did not remember that he fell. Oriented to person and time, not place or situation. Patient reoriented to situation and forgetting within just a few minutes. No longer complaining of pain. Denies SOB.   Objective: Vital signs in last 24 hours: Temp:  [96.2 F (35.7 C)-98.3 F (36.8 C)] 97.5 F (36.4 C) (07/25 0430) Pulse Rate:  [64-99] 71 (07/24 1900) Resp:  [14-34] 16 (07/25 0430) BP: (85-146)/(39-88) 97/57 (07/25 0430) SpO2:  [95 %-99 %] 97 % (07/25 0430) Last BM Date: 05/25/17  Intake/Output from previous day: 07/24 0701 - 07/25 0700 In: 1422.5 [P.O.:1050; I.V.:212.5; IV Piggyback:160] Out: 1110 [Urine:835; Chest Tube:275] Intake/Output this shift: No intake/output data recorded.  PE: Gen:  Alert, NAD, cooperative Card:  Regular rate and rhythm, pedal pulses 2+ BL Pulm:  Normal effort, clear to auscultation bilaterally. Pulling 1000 on IS Abd: Soft, non-tender, non-distended, bowel sounds present  MSK: left shoulder not TTP, no ecchymosis or edema.  Skin: warm and dry, no rashes  Psych: Oriented to person and time.    Lab Results:   Recent Labs  05/26/17 1702 05/26/17 1724 05/27/17 0301  WBC 4.9  --  7.0  HGB 11.4* 10.2* 11.1*  HCT 31.9* 30.0* 31.3*  PLT 119*  --  132*   BMET  Recent Labs  05/26/17 1724 05/27/17 0301  NA 133* 131*  K 3.7 4.2  CL 98* 99*  CO2  --  24  GLUCOSE 110* 129*  BUN 13 14  CREATININE 1.10 1.14  CALCIUM  --  9.2   PT/INR  Recent Labs  05/26/17 1702  LABPROT 14.1  INR 1.08   CMP     Component Value Date/Time   NA 131 (L) 05/27/2017 0301   NA 140 08/06/2016 1359   K 4.2 05/27/2017 0301   K 4.2 08/06/2016 1359   CL 99 (L) 05/27/2017 0301   CL 109 (H) 01/12/2013 0952   CO2 24 05/27/2017 0301   CO2 26 08/06/2016 1359   GLUCOSE 129 (H) 05/27/2017 0301   GLUCOSE 124 08/06/2016 1359   GLUCOSE 90 01/12/2013 0952   BUN 14  05/27/2017 0301   BUN 12.1 08/06/2016 1359   CREATININE 1.14 05/27/2017 0301   CREATININE 1.2 08/06/2016 1359   CALCIUM 9.2 05/27/2017 0301   CALCIUM 9.6 08/06/2016 1359   PROT 6.7 04/17/2017 1648   PROT 6.4 08/06/2016 1359   PROT 6.7 08/06/2016 1359   ALBUMIN 3.6 04/17/2017 1648   ALBUMIN 3.5 08/06/2016 1359   AST 21 04/17/2017 1648   AST 11 08/06/2016 1359   ALT 13 (L) 04/17/2017 1648   ALT 10 08/06/2016 1359   ALKPHOS 64 04/17/2017 1648   ALKPHOS 79 08/06/2016 1359   BILITOT 1.1 04/17/2017 1648   BILITOT 0.57 08/06/2016 1359   GFRNONAA 53 (L) 05/27/2017 0301   GFRAA >60 05/27/2017 0301   Lipase     Component Value Date/Time   LIPASE 31 05/26/2017 1702    Studies/Results: Ct Head Wo Contrast  Result Date: 05/26/2017 CLINICAL DATA:  Un witnessed fall today with headaches and neck pain, initial encounter EXAM: CT HEAD WITHOUT CONTRAST CT CERVICAL SPINE WITHOUT CONTRAST TECHNIQUE: Multidetector CT imaging of the head and cervical spine was performed following the standard protocol without intravenous contrast. Multiplanar CT image reconstructions of the cervical spine were also generated. COMPARISON:  06/03/2011 FINDINGS: CT HEAD FINDINGS Brain: Mild atrophic changes are noted. Chronic white  matter ischemic change is noted as well. No findings to suggest acute hemorrhage, acute infarction or space-occupying mass lesion are noted. Vascular: No hyperdense vessel or unexpected calcification. Skull: Normal. Negative for fracture or focal lesion. Sinuses/Orbits: No acute finding. Other: None. CT CERVICAL SPINE FINDINGS Alignment: There changes consistent with prior fusion from C3 to C6 with anterior fixation from C4-C6. Very mild degenerative anterolisthesis of C2 on C3 is seen. Skull base and vertebrae: 7 cervical segments are noted. The previous fusion is noted. Multilevel osteophytic changes are seen. Facet hypertrophic changes are noted as well. No acute fracture is noted. Neural  foraminal changes are noted particularly on the left secondary to the osteophytic change and neural foraminal narrowing. These are worst at C2-3. No acute fracture or acute facet abnormality is noted. Soft tissues and spinal canal: There is a 16 mm hypodensity within the right lobe of the thyroid. Diffuse carotid calcifications are seen. No other soft tissue abnormality is noted. Upper chest: Large left-sided pleural effusion is seen. Some subcutaneous air is also noted suggestive of pneumothorax. This will be better evaluated on upcoming CT of the chest. Other: None IMPRESSION: CT of the head: No acute intracranial abnormality noted. Chronic atrophic and ischemic changes are seen. CT of the cervical spine:  Multilevel fusion. Multilevel facet hypertrophic changes and neural foraminal changes as described worst on the left at C2-3. Large left pleural effusion and changes suggestive of underlying pneumothorax. This will be better evaluated on the upcoming CT of the chest. Electronically Signed   By: Inez Catalina M.D.   On: 05/26/2017 19:57   Ct Chest Wo Contrast  Result Date: 05/26/2017 CLINICAL DATA:  83 year old male with back pain following unwitnessed fall earlier today EXAM: CT CHEST WITHOUT CONTRAST TECHNIQUE: Multidetector CT imaging of the chest was performed following the standard protocol without IV contrast. COMPARISON:  Prior thoracic spine MRI 07/21/2014; recent prior CT abdomen/ pelvis 04/17/2017 FINDINGS: Cardiovascular: Limited evaluation in the absence of intravenous contrast. Extensive atherosclerotic calcifications throughout the aorta. The heart is normal in size. Calcifications present throughout the coronary arteries. No pericardial effusion. Mediastinum/Nodes: Heterogeneous multinodular thyroid gland most consistent with goiter. No significant mediastinal adenopathy or mass. Lungs/Pleura: Intermediate attenuation left-sided pleural effusion most consistent with hemothorax. Trace  pneumothorax. Dependent atelectasis in the left greater than right lower lobes. No suspicious mass or nodule. Upper Abdomen: Numerous circumscribed low-attenuation lesions in the liver and bilateral kidneys which are incompletely evaluated in the absence of intravenous contrast. The findings remain essentially unchanged compared to prior imaging and are therefore most consistent with simple cysts. Musculoskeletal: Acute fractures of left ribs 3, 4, 5, 6, 7, 8, and 9. There is a displaced comminuted fracture through the posterior aspect of left rib 8. Of note, the bony cortex of 1 of the bone fragments is located 1- 2 mm from the thoracic aorta. Remote healed right-sided rib fractures. Remote T11 compression fracture no new compression fracture identified. Subcutaneous emphysema noted tracking throughout the soft tissues of the posterolateral left neck and back. IMPRESSION: 1. Numerous acute left-sided rib fractures including left ribs 3, 4, 5, 6, 7, 8 and 9. The left eighth rib fracture is comminuted and displaced, and 1 of the bony shards is directed toward and nearly abuts the descending thoracic aorta. 2. Associated left hemo pneumothorax. The pneumothorax component is tiny. There is a small volume of subcutaneous emphysema tracking through the soft tissues of the posterior left chest. 3. Chronic T11 compression fracture. No new thoracic compression  fracture identified. 4. Coronary and aortic atherosclerotic calcifications. 5. Heterogeneous multinodular thyroid goiter noted incidentally. Aortic Atherosclerosis (ICD10-I70.0). Electronically Signed   By: Jacqulynn Cadet M.D.   On: 05/26/2017 20:06   Ct Cervical Spine Wo Contrast  Result Date: 05/26/2017 CLINICAL DATA:  Un witnessed fall today with headaches and neck pain, initial encounter EXAM: CT HEAD WITHOUT CONTRAST CT CERVICAL SPINE WITHOUT CONTRAST TECHNIQUE: Multidetector CT imaging of the head and cervical spine was performed following the standard  protocol without intravenous contrast. Multiplanar CT image reconstructions of the cervical spine were also generated. COMPARISON:  06/03/2011 FINDINGS: CT HEAD FINDINGS Brain: Mild atrophic changes are noted. Chronic white matter ischemic change is noted as well. No findings to suggest acute hemorrhage, acute infarction or space-occupying mass lesion are noted. Vascular: No hyperdense vessel or unexpected calcification. Skull: Normal. Negative for fracture or focal lesion. Sinuses/Orbits: No acute finding. Other: None. CT CERVICAL SPINE FINDINGS Alignment: There changes consistent with prior fusion from C3 to C6 with anterior fixation from C4-C6. Very mild degenerative anterolisthesis of C2 on C3 is seen. Skull base and vertebrae: 7 cervical segments are noted. The previous fusion is noted. Multilevel osteophytic changes are seen. Facet hypertrophic changes are noted as well. No acute fracture is noted. Neural foraminal changes are noted particularly on the left secondary to the osteophytic change and neural foraminal narrowing. These are worst at C2-3. No acute fracture or acute facet abnormality is noted. Soft tissues and spinal canal: There is a 16 mm hypodensity within the right lobe of the thyroid. Diffuse carotid calcifications are seen. No other soft tissue abnormality is noted. Upper chest: Large left-sided pleural effusion is seen. Some subcutaneous air is also noted suggestive of pneumothorax. This will be better evaluated on upcoming CT of the chest. Other: None IMPRESSION: CT of the head: No acute intracranial abnormality noted. Chronic atrophic and ischemic changes are seen. CT of the cervical spine:  Multilevel fusion. Multilevel facet hypertrophic changes and neural foraminal changes as described worst on the left at C2-3. Large left pleural effusion and changes suggestive of underlying pneumothorax. This will be better evaluated on the upcoming CT of the chest. Electronically Signed   By: Inez Catalina M.D.   On: 05/26/2017 19:57   Dg Chest Port 1 View  Result Date: 05/27/2017 CLINICAL DATA:  Chest tube placement EXAM: PORTABLE CHEST 1 VIEW COMPARISON:  Chest CT 05/26/2017 FINDINGS: The pigtail tip of the left-sided chest tube is at the medial left lung base. Multiple left-sided rib fractures are again seen. No visible pneumothorax. Decreased pleural fluid compared to the prior study. The right lung is clear. Extensive aortic atherosclerotic calcification. IMPRESSION: 1. Left-sided pigtail chest tube tip at the medial lung bases. No visible residual pneumothorax or pleural fluid/blood. 2.  Aortic Atherosclerosis (ICD10-I70.0). Electronically Signed   By: Ulyses Jarred M.D.   On: 05/27/2017 01:58   Dg Shoulder Left Port  Result Date: 05/27/2017 CLINICAL DATA:  Status post fall yesterday with multiple left-sided rib fractures with chest tube placement. Patient is complaining of left shoulder pain. EXAM: LEFT SHOULDER - 1 VIEW COMPARISON:  Chest x-ray of May 27, 2017 FINDINGS: The bones are subjectively adequately mineralized. Multiple left lateral rib fractures are present. The observed portions of the clavicle, scapula, and proximal humerus appear normal. There is a small amount of subcutaneous emphysema over the lower neck and upper thorax laterally. No definite pneumothorax is observed today. The small caliber pigtail catheter tip lies in the medial aspect  of the lower left pleural space. There is a trace of pleural fluid. There is dense calcification in the wall of the thoracic aorta. IMPRESSION: No acute abnormality of the visualized portions of the left clavicle, scapula, and proximal humerus is observed. Small amount of subcutaneous emphysema. No definite residual pneumothorax. The small caliber chest tube is in stable position. Multiple displaced left lateral rib fractures. Electronically Signed   By: David  Martinique M.D.   On: 05/27/2017 09:11     Assessment/Plan Fall from ground level L  rib 3-9 fractures with large hemothorax - continue chest tube and put to water seal - 275 cc serosanguinous output - CXR: no ptx and slight improvement of hemothorax on preliminary read - hgb 11.1 yesterday, AM labs pending  - pain control - scheduled robaxin, scheduled tylenol, tramadol 100 mg q6 PRN - pulm toilet, IS - PT/OT L shoulder pain  - XR: no acute abnormality of clavicle, scapula, and proximal humerus   Urinary Retention - foley placed last night  - urecholine 10 mg TID  HTN - hold home antihypertensives if SBP <120 or DBP <60 Anxiety - home xanax  FEN - HH/carb mod VTE - SCDs, no lovenox due to hemothorax ID - no abx  Dispo: PT/OT. Will discuss code status with patient and daughter today. Encourage patient to use IS. Labs pending. Transfer to floor.   LOS: 2 days    Brigid Re , [facility] 05/28/2017, 7:21 AM Pager: [phone] Trauma Pager: [phone] Mon-Fri 7:00 am-4:30 pm Sat-Sun 7:00 am-11:30 am

## 2017-05-28 NOTE — Progress Notes (Signed)
Pts Na 128 this AM. Spoke w/ Trauma PA who will adjust meds accordingly.

## 2017-05-28 NOTE — Evaluation (Addendum)
Occupational Therapy Evaluation Patient Details Name: Frank Kramer MRN: [id number] DOB: 02-24-1922 Today's Date: 05/28/2017    History of Present Illness This 83 y.o. male admitted 05/27/17 after a fall at home.  He sustained  Lt sided rib fractures (3-9)and  hemopneumothorax - chest tube placed.   PMH includes:  PMH includes back pain, C-diff, CAD. h/o lymphoma, HTN, panic disorder   Clinical Impression   Pt admitted with above. He demonstrates the below listed deficits and will benefit from continued OT to maximize safety and independence with BADLs.  Pt presents with decreased activity tolerance, generalized weakness, impaired cognition, decreased balance, and pain which impacts all aspects of ADLs.  He requires max - total A for ADLs and total A +2 for bed mobility.   He was unable to progress to OOB this date due to pain.  He will likely require SNF.        Follow Up Recommendations  SNF;Supervision/Assistance - 24 hour    Equipment Recommendations  None recommended by OT    Recommendations for Other Services       Precautions / Restrictions Precautions Precautions: Fall Precaution Comments: Lt chest tube       Mobility Bed Mobility Overal bed mobility: Needs Assistance Bed Mobility: Sit to Supine;Supine to Sit     Supine to sit: Total assist;+2 for physical assistance Sit to supine: Total assist;+2 for physical assistance   General bed mobility comments: Pt able to assist minimally with moving LEs toward EOB, and did attempt to lift trunk minimally   Transfers                 General transfer comment: unable to attempt due to pain     Balance Overall balance assessment: Needs assistance Sitting-balance support: Feet supported Sitting balance-Leahy Scale: Zero Sitting balance - Comments: Pt sat EOB x 4 mins with total A to brief periods of max A                                    ADL either performed or assessed with clinical judgement    ADL Overall ADL's : Needs assistance/impaired Eating/Feeding: Minimal assistance;Bed level Eating/Feeding Details (indicate cue type and reason): limited intake  Grooming: Wash/dry hands;Wash/dry face;Brushing hair;Maximal assistance;Bed level   Upper Body Bathing: Total assistance;Bed level   Lower Body Bathing: Total assistance;Bed level   Upper Body Dressing : Total assistance;Sitting   Lower Body Dressing: Total assistance;Bed level   Toilet Transfer: Total assistance Toilet Transfer Details (indicate cue type and reason): unable to attempt due pain  Toileting- Clothing Manipulation and Hygiene: Total assistance;Bed level       Functional mobility during ADLs: Total assistance;+2 for physical assistance General ADL Comments: Pt limited by pain      Vision         Perception     Praxis      Pertinent Vitals/Pain Pain Assessment: Faces Faces Pain Scale: Hurts even more Pain Location: bil. hips and ribs  Pain Descriptors / Indicators: Grimacing;Guarding;Moaning Pain Intervention(s): Limited activity within patient's tolerance;Repositioned     Hand Dominance Right   Extremity/Trunk Assessment Upper Extremity Assessment Upper Extremity Assessment: Generalized weakness (difficult to assess due to rib pain )   Lower Extremity Assessment Lower Extremity Assessment: Defer to PT evaluation   Cervical / Trunk Assessment Cervical / Trunk Assessment: Kyphotic   Communication Communication Communication: No difficulties   Cognition Arousal/Alertness:  Awake/alert Behavior During Therapy: WFL for tasks assessed/performed Overall Cognitive Status: Impaired/Different from baseline Area of Impairment: Orientation;Attention;Memory;Following commands;Safety/judgement;Awareness;Problem solving                 Orientation Level: Disoriented to;Time;Place Current Attention Level: Sustained Memory: Decreased short-term memory Following Commands: Follows one step  commands consistently Safety/Judgement: Decreased awareness of deficits   Problem Solving: Slow processing;Decreased initiation;Difficulty sequencing;Requires verbal cues;Requires tactile cues General Comments: Pt preoccupied by pain so difficult to accurately assess.  Pt was noted to be disoriented , and looks to family frequently to assist with answering questions    General Comments  VSS    Exercises     Shoulder Instructions      Home Living Family/patient expects to be discharged to:: Skilled nursing facility Living Arrangements: Children Available Help at Discharge: Family;Available 24 hours/day;Personal care attendant Type of Home: House Home Access: Ramped entrance     Home Layout: One level     Bathroom Shower/Tub: Occupational psychologist: Handicapped height     Home Equipment: Environmental consultant - 2 wheels   Additional Comments: lives with daughter.  Has a PCA 2 hours/day who assists with bathing and dressing       Prior Functioning/Environment Level of Independence: Needs assistance  Gait / Transfers Assistance Needed: ambulated with RW, but frequently forgot to use it, and supervision to min A.  H/o falls  ADL's / Homemaking Assistance Needed: PCA assists with ADLs             OT Problem List: Decreased strength;Decreased activity tolerance;Impaired balance (sitting and/or standing);Decreased cognition;Decreased safety awareness;Decreased knowledge of use of DME or AE;Decreased knowledge of precautions;Cardiopulmonary status limiting activity;Pain;Impaired UE functional use      OT Treatment/Interventions: Self-care/ADL training;Therapeutic exercise;DME and/or AE instruction;Therapeutic activities;Cognitive remediation/compensation;Patient/family education;Balance training    OT Goals(Current goals can be found in the care plan section) Acute Rehab OT Goals Patient Stated Goal: did not state  OT Goal Formulation: With patient/family Time For Goal  Achievement: 06/11/17 Potential to Achieve Goals: Good ADL Goals Pt Will Perform Grooming: with supervision;with set-up;sitting Pt Will Perform Upper Body Bathing: with mod assist;sitting Pt Will Transfer to Toilet: with max assist;stand pivot transfer;bedside commode Additional ADL Goal #1: Pt will tolerate EOB sitting x 15 mins with min A in prep for ADLs   OT Frequency: Min 2X/week   Barriers to D/C: Decreased caregiver support          Co-evaluation PT/OT/SLP Co-Evaluation/Treatment: Yes Reason for Co-Treatment: Complexity of the patient's impairments (multi-system involvement);For patient/therapist safety   OT goals addressed during session: ADL's and self-care      AM-PAC PT "6 Clicks" Daily Activity     Outcome Measure Help from another person eating meals?: A Lot Help from another person taking care of personal grooming?: A Lot Help from another person toileting, which includes using toliet, bedpan, or urinal?: Total Help from another person bathing (including washing, rinsing, drying)?: Total Help from another person to put on and taking off regular upper body clothing?: Total Help from another person to put on and taking off regular lower body clothing?: Total 6 Click Score: 8   End of Session Nurse Communication: Mobility status  Activity Tolerance: Patient limited by pain Patient left: in bed;with call bell/phone within reach;with family/visitor present  OT Visit Diagnosis: Pain Pain - Right/Left: Right Pain - part of body: Leg (bil hips and ribs )  Time: 0370-9643  OT Time Calculation (min):36 mins Charges:  OT General Charges $OT Visit: 1 Procedure OT Evaluation $OT Eval High Complexity: 1 Procedure G-Codes:     Lucille Passy, OTR/L [phone]   Thomasene Dubow M 05/28/2017, 1:05 PM

## 2017-05-29 ENCOUNTER — Inpatient Hospital Stay (HOSPITAL_COMMUNITY): Payer: Medicare Other

## 2017-05-29 LAB — BASIC METABOLIC PANEL
Anion gap: 5 (ref 5–15)
BUN: 21 mg/dL — ABNORMAL HIGH (ref 6–20)
CHLORIDE: 100 mmol/L — AB (ref 101–111)
CO2: 22 mmol/L (ref 22–32)
CREATININE: 1.3 mg/dL — AB (ref 0.61–1.24)
Calcium: 8.2 mg/dL — ABNORMAL LOW (ref 8.9–10.3)
GFR calc non Af Amer: 46 mL/min — ABNORMAL LOW (ref >60)
GFR, EST AFRICAN AMERICAN: 53 mL/min — AB (ref >60)
Glucose, Bld: 102 mg/dL — ABNORMAL HIGH (ref 65–99)
Potassium: 3.8 mmol/L (ref 3.5–5.1)
Sodium: 127 mmol/L — ABNORMAL LOW (ref 135–145)

## 2017-05-29 LAB — CBC
HEMATOCRIT: 25.2 % — AB (ref 39.0–52.0)
HEMOGLOBIN: 8.9 g/dL — AB (ref 13.0–17.0)
MCH: 31.4 pg (ref 26.0–34.0)
MCHC: 35.3 g/dL (ref 30.0–36.0)
MCV: 89 fL (ref 78.0–100.0)
Platelets: 123 10*3/uL — ABNORMAL LOW (ref 150–400)
RBC: 2.83 MIL/uL — AB (ref 4.22–5.81)
RDW: 13.4 % (ref 11.5–15.5)
WBC: 6.9 10*3/uL (ref 4.0–10.5)

## 2017-05-29 NOTE — NC FL2 (Signed)
[place] LEVEL OF CARE SCREENING TOOL     IDENTIFICATION  Patient Name: Frank Kramer Birthdate: 23-May-1922 Sex: male Admission Date (Current Location): 05/26/2017  [facility] [place] Number:  Herbalist and Address:  [facility] [place], [place] [address] [place], [place] [address]      Provider Number: [id number]  Attending Physician Name and Address:  Md, Trauma, MD  Relative Name and Phone Number:       Current Level of Care: Hospital Recommended Level of Care: [place] Prior Approval Number:    Date Approved/Denied:   PASRR Number: [id number]  Discharge Plan: SNF    Current Diagnoses: Patient Active Problem List   Diagnosis Date Noted  . Hemopneumothorax on left 05/26/2017  . Multiple fractures of ribs, left side, initial encounter for closed fracture 05/26/2017  . Pancytopenia, acquired ([place]) 08/13/2016  . Weight loss 08/13/2016  . Chronic diarrhea 08/13/2016  . Acute lower GI bleeding 04/19/2016  . Dyslipidemia 05/10/2015  . Midline low back pain without sciatica 08/09/2014  . Sinusitis 08/09/2014  . Back pain 07/18/2014  . Thrombocytopenia, unspecified ([place]) 07/18/2014  . Palpitations 02/18/2014  . Basal cell carcinoma of face 07/20/2013  . Hx of lymphoma, non-Hodgkins 07/19/2013  . Acute blood loss anemia 12/15/2012  . C. difficile colitis 12/10/2012  . Dehydration 12/10/2012  . Orthostatic hypotension 12/10/2012  . Paget's bone disease 12/04/2012  . Colitis 12/04/2012  . LGI bleed 12/04/2012  . MGUS (monoclonal gammopathy of unknown significance) 06/07/2012  . Rhinitis 06/07/2012  . Rectal bleeding 11/21/2011  . HTN (hypertension) 11/21/2011  . GERD (gastroesophageal reflux disease) 11/21/2011  . Anxiety disorder 11/21/2011  . Arthritis 11/21/2011  . Anemia in neoplastic disease 11/21/2011    Orientation RESPIRATION BLADDER Height & Weight     Self, Time  Normal Incontinent Weight: 138  lb (62.6 kg) Height:  5\' 7"  (170.2 cm)  BEHAVIORAL SYMPTOMS/MOOD NEUROLOGICAL BOWEL NUTRITION STATUS      Continent  (Please see d/c summary)  AMBULATORY STATUS COMMUNICATION OF NEEDS Skin   Extensive Assist Verbally Normal                       Personal Care Assistance Level of Assistance  Bathing, Feeding, Dressing Bathing Assistance: Maximum assistance Feeding assistance: Limited assistance Dressing Assistance: Maximum assistance     Functional Limitations Info  Sight, Hearing, Speech Sight Info: Adequate Hearing Info: Adequate Speech Info: Adequate    SPECIAL CARE FACTORS FREQUENCY  PT (By licensed PT), OT (By licensed OT)     PT Frequency: 3x week OT Frequency: 3x week            Contractures Contractures Info: Not present    Additional Factors Info  Code Status, Allergies Code Status Info: DNR Allergies Info: Aspirin, Ciprofloxacin, Codeine, Iohexol, Macrodantin, Morphine And Related           Current Medications (05/29/2017):  This is the current hospital active medication list Current Facility-Administered Medications  Medication Dose Route Frequency Provider Last Rate Last Dose  . 0.9 %  sodium chloride infusion   Intravenous Continuous Georganna Skeans, MD 10 mL/hr at 05/29/17 1033    . acetaminophen (TYLENOL) tablet 500 mg  500 mg Oral Q4H Rayburn, Kelly A, PA-C   500 mg at 05/29/17 1036  . ALPRAZolam Duanne Moron) tablet 0.5 mg  0.5 mg Oral BID PRN Georganna Skeans, MD   0.5 mg at 05/28/17 2123  . bethanechol (  URECHOLINE) tablet 10 mg  10 mg Oral TID Rayburn, Kelly A, PA-C   10 mg at 05/29/17 1034  . carvedilol (COREG) tablet 25 mg  25 mg Oral BID WC Georganna Skeans, MD   25 mg at 05/29/17 1045  . finasteride (PROSCAR) tablet 5 mg  5 mg Oral Daily Georganna Skeans, MD   5 mg at 05/29/17 1045  . hydrALAZINE (APRESOLINE) injection 10 mg  10 mg Intravenous Q2H PRN Georganna Skeans, MD      . ipratropium (ATROVENT) 0.06 % nasal spray 2 spray  2 spray Each Nare  TID PRN Georganna Skeans, MD      . MEDLINE mouth rinse  15 mL Mouth Rinse BID Georganna Skeans, MD   15 mL at 05/29/17 1047  . methocarbamol (ROBAXIN) tablet 1,000 mg  1,000 mg Oral Q8H Rayburn, Kelly A, PA-C   1,000 mg at 05/29/17 0506  . ondansetron (ZOFRAN-ODT) disintegrating tablet 4 mg  4 mg Oral Q6H PRN Georganna Skeans, MD       Or  . ondansetron [facility]) injection 4 mg  4 mg Intravenous Q6H PRN Georganna Skeans, MD      . pantoprazole (PROTONIX) EC tablet 40 mg  40 mg Oral Daily Georganna Skeans, MD   40 mg at 05/29/17 1036  . polyethylene glycol (MIRALAX / GLYCOLAX) packet 17 g  17 g Oral Daily PRN Georganna Skeans, MD   17 g at 05/28/17 1058  . sodium chloride tablet 1 g  1 g Oral TID WC Rayburn, Kelly A, PA-C   1 g at 05/29/17 1035  . traMADol (ULTRAM) tablet 100 mg  100 mg Oral Q6H PRN Rayburn, Kelly A, PA-C   100 mg at 05/28/17 2317     Discharge Medications: Please see discharge summary for a list of discharge medications.  Relevant Imaging Results:  Relevant Lab Results:   Additional Information SSN: [id number]  Eileen Stanford, LCSW

## 2017-05-29 NOTE — Clinical Social Work Note (Signed)
Clinical Social Work Assessment  Patient Details  Name: Frank Kramer MRN: [id number] Date of Birth: July 07, 1922  Date of referral:  05/29/17               Reason for consult:  Facility Placement                Permission sought to share information with:  Family Supports Permission granted to share information::  Yes, Verbal Permission Granted  Name::     [place]  Relationship::  Daughter / POA  Contact Information:  [phone]  Housing/Transportation Living arrangements for the past 2 months:  [place] of Information:  Patient, Adult Children Patient Interpreter Needed:  None Criminal Activity/Legal Involvement Pertinent to Current Situation/Hospitalization:  No - Comment as needed Significant Relationships:  Adult Children Lives with:  Adult Children Do you feel safe going back to the place where you live?  Yes Need for family participation in patient care:  Yes (Comment)  Care giving concerns:  Patient daughter states that patient has had in home rehab, but has not had any form stay in a facility.  Patient wife was at [facility] prior to her death and family requests for placement elsewhere.   Social Worker assessment / plan:  Holiday representative met with patient and patient daughter at bedside to offer support and discuss patient needs at discharge.  Patient with unclear thought process, however patient daughter was able to fill in gaps in patient conversation.  Patient daughter states that patient lives at home with one of his daughters who is also not in the best health.  Patient daughter understanding of recommendation for skilled nursing facility, but would also like to inquire about patient eligibility for inpatient rehab.  CSW to further explore with admissions coordinator, however in the meantime CSW to initiate SNF referral and have available bed offers.  CSW remains available for support and to facilitate patient discharge needs once medically  stable.  Employment status:  Retired Nurse, adult PT Recommendations:  [place] / Referral to community resources:  [place]  Patient/Family's Response to care:  Patient and patient daughter verbalize understanding of CSW role and appreciative of support and concern on patient behalf.  Patient daughter is hopeful for inpatient rehab, but agreeable to SNF placement.  Patient/Family's Understanding of and Emotional Response to Diagnosis, Current Treatment, and Prognosis:  Patient family understanding of patient limitations and need for SNF placement prior to return home.    Emotional Assessment Appearance:  Appears stated age Attitude/Demeanor/Rapport:  Inconsistent Affect (typically observed):  Apprehensive, Appropriate, Pleasant Orientation:  Oriented to Self Alcohol / Substance use:  Not Applicable (Patient mental status not appropriate for SBIRT completion) Psych involvement (Current and /or in the community):  No (Comment)  Discharge Needs  Concerns to be addressed:  No discharge needs identified Readmission within the last 30 days:  No Current discharge risk:  None Barriers to Discharge:  Continued Medical Work up  [facility] [employer], [place]

## 2017-05-29 NOTE — Progress Notes (Signed)
  Subjective: CC L rib pain improved, ate some this AM  Objective: Vital signs in last 24 hours: Temp:  [98.1 F (36.7 C)-98.4 F (36.9 C)] 98.1 F (36.7 C) (07/26 0428) Pulse Rate:  [72-89] 89 (07/26 0428) Resp:  [15-19] 19 (07/26 0428) BP: (81-135)/(50-87) 122/87 (07/26 0428) SpO2:  [93 %-95 %] 94 % (07/26 0428) Last BM Date: 05/25/17  Intake/Output from previous day: 07/25 0701 - 07/26 0700 In: 1032.3 [P.O.:118; I.V.:914.3] Out: 970 [Urine:860; Chest Tube:110] Intake/Output this shift: Total I/O In: 240 [P.O.:240] Out: 600 [Urine:600]  General appearance: alert and cooperative Resp: clear to auscultation bilaterally Chest wall: left sided chest wall tenderness Cardio: regular rate and rhythm GI: soft, NT Extremities: calves soft Neuor: alert and F/C  Lab Results: CBC   Recent Labs  05/28/17 0725 05/29/17 0523  WBC 6.9 6.9  HGB 9.3* 8.9*  HCT 26.1* 25.2*  PLT 105* 123*   BMET  Recent Labs  05/28/17 0725 05/29/17 0523  NA 128* 127*  K 3.9 3.8  CL 99* 100*  CO2 22 22  GLUCOSE 90 102*  BUN 25* 21*  CREATININE 1.55* 1.30*  CALCIUM 8.5* 8.2*   PT/INR  Recent Labs  05/26/17 1702  LABPROT 14.1  INR 1.08   Assessment/Plan: Fall from ground level L rib 3-9 fractures with large hemothorax - continue chest tube and put to water seal - 110cc/24h, hope to D/C CT tomorrow - CXR: no ptx  - pain control - scheduled robaxin, scheduled tylenol, tramadol 100 mg q6 PRN - pulm toilet, IS Urinary Retention - foley placed last night  - urecholine 10 mg TID - voiding trial 7/27 Anxiety - home xanax FEN - HH/carb mod VTE - SCDs, no lovenox due to hemothorax  Dispo: PT/OT rec SNF   LOS: 3 days    Georganna Skeans, MD, MPH, FACS Trauma: [phone] General Surgery: [phone]  7/26/2018Patient ID: Frank Kramer, male   DOB: [birth year], 83 y.o.   MRN: [id number]

## 2017-05-29 NOTE — Care Management Note (Signed)
Case Management Note  Patient Details  Name: Frank Kramer MRN: [id number] Date of Birth: Dec 11, 1921  Subjective/Objective:    Pt admitted on 05/26/17 s/p ground level fall with Lt rib fx and HPTX.  PTA, pt resided at home with daughter; he has personal care attendant 2 hours/day, who assists with dressing and bathing.                  Action/Plan: PT/OT recommending SNF for rehab at dc; CSW consulted to facilitate dc to SNF upon medical stability.  Will follow.   Expected Discharge Date:                  Expected Discharge Plan:  Skilled Nursing Facility  In-House Referral:  Clinical Social Work  Discharge planning Services  CM Consult  Post Acute Care Choice:    Choice offered to:     DME Arranged:    DME Agency:     HH Arranged:    [place] Agency:     Status of Service:  In process, will continue to follow  If discussed at Long Length of Stay Meetings, dates discussed:    Additional Comments:  Reinaldo Raddle, RN, BSN  Trauma/Neuro ICU Case Manager [phone]

## 2017-05-29 NOTE — Clinical Social Work Placement (Addendum)
   CLINICAL SOCIAL WORK PLACEMENT  NOTE  Date:  05/29/2017  Patient Details  Name: KAMARON DESKINS MRN: [id number] Date of Birth: 1922-04-18  Clinical Social Work is seeking post-discharge placement for this patient at the [place] level of care (*CSW will initial, date and re-position this form in  chart as items are completed):  Yes   Patient/family provided with [place] Work Department's list of facilities offering this level of care within the geographic area requested by the patient (or if unable, by the patient's family).  Yes   Patient/family informed of their freedom to choose among providers that offer the needed level of care, that participate in Medicare, Medicaid or managed care program needed by the patient, have an available bed and are willing to accept the patient.  Yes   Patient/family informed of [facility]'s ownership interest in [facility] and [facility], as well as of the fact that they are under no obligation to receive care at these facilities.  PASRR submitted to EDS on 05/29/17     PASRR number received on 05/29/17     Existing PASRR number confirmed on       FL2 transmitted to all facilities in geographic area requested by pt/family on 05/29/17     FL2 transmitted to all facilities within larger geographic area on       Patient informed that his/her managed care company has contracts with or will negotiate with certain facilities, including the following:            Patient/family informed of bed offers received.  [facility]     Physician recommends and patient chooses bed at      Patient to be transferred to [facility] on 06/02/17.  Patient to be transferred to facility by [facility]     Patient family notified on 06/02/17 of transfer.  Name of family member notified:  Romie Minus     PHYSICIAN Please sign FL2     Additional Comment:   Barbette Or, [place]

## 2017-05-30 ENCOUNTER — Inpatient Hospital Stay (HOSPITAL_COMMUNITY): Payer: Medicare Other

## 2017-05-30 LAB — BASIC METABOLIC PANEL
ANION GAP: 7 (ref 5–15)
BUN: 19 mg/dL (ref 6–20)
CHLORIDE: 104 mmol/L (ref 101–111)
CO2: 19 mmol/L — ABNORMAL LOW (ref 22–32)
CREATININE: 1.03 mg/dL (ref 0.61–1.24)
Calcium: 8.5 mg/dL — ABNORMAL LOW (ref 8.9–10.3)
GFR calc non Af Amer: >60 mL/min (ref >60)
Glucose, Bld: 110 mg/dL — ABNORMAL HIGH (ref 65–99)
POTASSIUM: 4 mmol/L (ref 3.5–5.1)
SODIUM: 130 mmol/L — AB (ref 135–145)

## 2017-05-30 MED ORDER — TAMSULOSIN HCL 0.4 MG PO CAPS
0.4000 mg | ORAL_CAPSULE | Freq: Every day | ORAL | Status: DC
  Administered 2017-05-30 – 2017-06-02 (×4): 0.4 mg via ORAL
  Filled 2017-05-30 (×4): qty 1

## 2017-05-30 NOTE — Progress Notes (Signed)
Physical Therapy Treatment Patient Details Name: Frank Kramer MRN: [id number] DOB: [birth year] Today's Date: 05/30/2017    History of Present Illness This 83 y.o. male admitted 05/27/17 after a fall at home.  He sustained  Lt sided rib fractures (3-9)and  hemopneumothorax - chest tube placed.   PMH includes:  PMH includes back pain, C-diff, CAD. h/o lymphoma, HTN, panic disorder    PT Comments    Pt demonstrates significant improvements in mobility this session and improved participation. Pt is able to perform bed mobs with Mod to max A and transfers with Mod to Max A this session which is significantly improved from previous session. Pt continues to benefit from SNF for rehab prior to discharging back home.     Follow Up Recommendations  SNF;Supervision/Assistance - 24 hour     Equipment Recommendations  None recommended by PT    Recommendations for Other Services       Precautions / Restrictions Precautions Precautions: Fall Precaution Comments: Lt chest tube  Restrictions Weight Bearing Restrictions: No    Mobility  Bed Mobility Overal bed mobility: Needs Assistance Bed Mobility: Supine to Sit     Supine to sit: +2 for physical assistance;Mod assist     General bed mobility comments: Pt able to assist with moving LE's EOB, but inevitably requires Mod A to get EOB  Transfers Overall transfer level: Needs assistance Equipment used: 2 person hand held assist Transfers: Sit to/from [facility] Sit to Stand: Mod assist;Max assist Stand pivot transfers: Mod assist;Max assist;+2 physical assistance       General transfer comment: Mod A to stand and Mod to Max A +2 to pivot transfer from bed to recliner.   Ambulation/Gait             General Gait Details: unable to attempt this session.   Stairs            Wheelchair Mobility    Modified Rankin (Stroke Patients Only)       Balance Overall balance assessment: Needs  assistance Sitting-balance support: Feet supported Sitting balance-Leahy Scale: Fair Sitting balance - Comments: Pt sat EOB to perform hygiene including washing face with Min guard Postural control: Right lateral lean Standing balance support: Bilateral upper extremity supported Standing balance-Leahy Scale: Poor                              Cognition Arousal/Alertness: Awake/alert Behavior During Therapy: WFL for tasks assessed/performed Overall Cognitive Status: Within Functional Limits for tasks assessed                                        Exercises      General Comments        Pertinent Vitals/Pain Pain Assessment: Faces Faces Pain Scale: Hurts little more Pain Location: bil. hips and ribs  Pain Descriptors / Indicators: Sore;Grimacing Pain Intervention(s): Limited activity within patient's tolerance;Monitored during session;Premedicated before session    Home Living                      Prior Function            PT Goals (current goals can now be found in the care plan section) Acute Rehab PT Goals Patient Stated Goal: did not state  Progress towards PT goals: Progressing toward goals  Frequency    Min 3X/week      PT Plan Current plan remains appropriate    Co-evaluation PT/OT/SLP Co-Evaluation/Treatment: Yes Reason for Co-Treatment: Complexity of the patient's impairments (multi-system involvement);For patient/therapist safety;To address functional/ADL transfers PT goals addressed during session: Mobility/safety with mobility OT goals addressed during session: ADL's and self-care      AM-PAC PT "6 Clicks" Daily Activity  Outcome Measure  Difficulty turning over in bed (including adjusting bedclothes, sheets and blankets)?: Total Difficulty moving from lying on back to sitting on the side of the bed? : Total Difficulty sitting down on and standing up from a chair with arms (e.g., wheelchair, bedside  commode, etc,.)?: Total Help needed moving to and from a bed to chair (including a wheelchair)?: A Lot Help needed walking in hospital room?: Total Help needed climbing 3-5 steps with a railing? : Total 6 Click Score: 7    End of Session Equipment Utilized During Treatment: Gait belt Activity Tolerance: Patient tolerated treatment well Patient left: in chair;with call bell/phone within reach;with family/visitor present Nurse Communication: Mobility status PT Visit Diagnosis: Muscle weakness (generalized) (M62.81);History of falling (Z91.81);Difficulty in walking, not elsewhere classified (R26.2);Pain Pain - Right/Left: Left Pain - part of body:  (ribs and hips)     Time: 0160-1093 PT Time Calculation (min) (ACUTE ONLY): 28 min  Charges:  $Therapeutic Activity: 8-22 mins                    G Codes:       Frank Kramer PT, DPT  [phone]    Frank Kramer 05/30/2017, 2:06 PM

## 2017-05-30 NOTE — Clinical Social Work Note (Signed)
Clinical Social Worker continuing to follow patient and family for support and discharge planning needs.  CSW met with patient and patient daughters at bedside to provide bed offers.  Patient family has accepted offer to [facility] - facility agreeable with weekend admission if needed.  Patient family agreeable with plan.  CSW remains available for support and to facilitate patient discharge needs once medically stable.  Barbette Or, [place]

## 2017-05-30 NOTE — Progress Notes (Signed)
Occupational Therapy Treatment Patient Details Name: Frank Kramer MRN: [id number] DOB: 1922/05/04 Today's Date: 05/30/2017    History of present illness This 83 y.o. male admitted 05/27/17 after a fall at home.  He sustained  Lt sided rib fractures (3-9)and  hemopneumothorax - chest tube placed.   PMH includes:  PMH includes back pain, C-diff, CAD. h/o lymphoma, HTN, panic disorder   OT comments  Pt much improved this OT visit  Follow Up Recommendations  SNF;Supervision/Assistance - 24 hour    Equipment Recommendations  None recommended by OT       Precautions / Restrictions Precautions Precautions: Fall Precaution Comments: Lt chest tube  Restrictions Weight Bearing Restrictions: No       Mobility Bed Mobility Overal bed mobility: Needs Assistance Bed Mobility: Supine to Sit     Supine to sit: +2 for physical assistance;Mod assist        Transfers Overall transfer level: Needs assistance Equipment used: 2 person hand held assist Transfers: Sit to/from [employer] Transfers Sit to Stand: Mod assist;Max assist Stand pivot transfers: Mod assist;Max assist            Balance Overall balance assessment: Needs assistance Sitting-balance support: Feet supported Sitting balance-Leahy Scale: Fair Sitting balance - Comments: Pt sat EOB x 4 mins with total A to brief periods of max A  Postural control: Right lateral lean Standing balance support: Bilateral upper extremity supported Standing balance-Leahy Scale: Poor                             ADL either performed or assessed with clinical judgement   ADL Overall ADL's : Needs assistance/impaired     Grooming: Wash/dry face;Sitting;Minimal assistance   Upper Body Bathing: Moderate assistance;Sitting   Lower Body Bathing: Sit to/from stand;+2 for safety/equipment;+2 for physical assistance;Maximal assistance   Upper Body Dressing : Minimal assistance;Sitting                      General ADL Comments: pt stood for LB bathing.       Vision Patient Visual Report: No change from baseline     Perception     Praxis      Cognition Arousal/Alertness: Awake/alert Behavior During Therapy: WFL for tasks assessed/performed Overall Cognitive Status: Within Functional Limits for tasks assessed                                                     Pertinent Vitals/ Pain       Faces Pain Scale: Hurts little more Pain Location: bil. hips and ribs  Pain Descriptors / Indicators: Sore Pain Intervention(s): Limited activity within patient's tolerance;Monitored during session;Premedicated before session;Repositioned         Frequency  Min 2X/week        Progress Toward Goals  OT Goals(current goals can now be found in the care plan section)  Progress towards OT goals: Progressing toward goals     Plan Discharge plan remains appropriate    Co-evaluation    PT/OT/SLP Co-Evaluation/Treatment: Yes Reason for Co-Treatment: Complexity of the patient's impairments (multi-system involvement);For patient/therapist safety   OT goals addressed during session: ADL's and self-care      AM-PAC PT "6 Clicks" Daily Activity     Outcome Measure   Help  from another person eating meals?: A Little Help from another person taking care of personal grooming?: A Little Help from another person toileting, which includes using toliet, bedpan, or urinal?: A Lot Help from another person bathing (including washing, rinsing, drying)?: A Lot Help from another person to put on and taking off regular upper body clothing?: A Lot Help from another person to put on and taking off regular lower body clothing?: A Lot 6 Click Score: 14    End of Session    OT Visit Diagnosis: Pain;Unsteadiness on feet (R26.81);Muscle weakness (generalized) (M62.81) Pain - Right/Left: Right Pain - part of body: Leg (bil hips and ribs )   Activity Tolerance Patient tolerated  treatment well   Patient Left with call bell/phone within reach;with family/visitor present;in chair   Nurse Communication Mobility status        Time: 9381-8299 OT Time Calculation (min): 31 min  Charges: OT General Charges $OT Visit: 1 Procedure OT Treatments $Self Care/Home Management : 8-22 mins  Kari Baars, [place] [employer]   Frank Kramer 05/30/2017, 1:07 PM

## 2017-05-30 NOTE — Progress Notes (Signed)
[facility] Progress Note     Subjective: CC:  L rib pain improving but still limiting his movement/mobility. Complaining about constipation. Tolerating PO. Denies fever, chills, nausea, vomiting.   Objective: Vital signs in last 24 hours: Temp:  [97.8 F (36.6 C)-98.5 F (36.9 C)] 98.2 F (36.8 C) (07/27 0544) Pulse Rate:  [65-68] 66 (07/27 0544) Resp:  [17-19] 19 (07/27 0544) BP: (116-128)/(41-46) 128/41 (07/27 0544) SpO2:  [92 %-94 %] 93 % (07/27 0544) Last BM Date: 05/25/17  Intake/Output from previous day: 07/26 0701 - 07/27 0700 In: 1420.3 [P.O.:960; I.V.:460.3] Out: 1260 [Urine:1250; Chest Tube:10] Intake/Output this shift: No intake/output data recorded.  PE: Gen:  Alert, NAD, pleasant and cooperative Card:  Regular rate and rhythm, pedal pulses 2+ BL; CT to waterseal, 10 cc/24h  Pulm:  Normal effort, clear to auscultation bilaterally Abd: Soft, non-tender, non-distended, bowel sounds present  GU: foley for retention, UOP 1,250 cc/24h Skin: warm and dry, no rashes  Psych: A&Ox3 Neuro: following commands  Lab Results:   Recent Labs  05/28/17 0725 05/29/17 0523  WBC 6.9 6.9  HGB 9.3* 8.9*  HCT 26.1* 25.2*  PLT 105* 123*   BMET  Recent Labs  05/28/17 0725 05/29/17 0523  NA 128* 127*  K 3.9 3.8  CL 99* 100*  CO2 22 22  GLUCOSE 90 102*  BUN 25* 21*  CREATININE 1.55* 1.30*  CALCIUM 8.5* 8.2*   CMP     Component Value Date/Time   NA 127 (L) 05/29/2017 0523   NA 140 08/06/2016 1359   K 3.8 05/29/2017 0523   K 4.2 08/06/2016 1359   CL 100 (L) 05/29/2017 0523   CL 109 (H) 01/12/2013 0952   CO2 22 05/29/2017 0523   CO2 26 08/06/2016 1359   GLUCOSE 102 (H) 05/29/2017 0523   GLUCOSE 124 08/06/2016 1359   GLUCOSE 90 01/12/2013 0952   BUN 21 (H) 05/29/2017 0523   BUN 12.1 08/06/2016 1359   CREATININE 1.30 (H) 05/29/2017 0523   CREATININE 1.2 08/06/2016 1359   CALCIUM 8.2 (L) 05/29/2017 0523   CALCIUM 9.6 08/06/2016 1359   PROT  6.7 04/17/2017 1648   PROT 6.4 08/06/2016 1359   PROT 6.7 08/06/2016 1359   ALBUMIN 3.6 04/17/2017 1648   ALBUMIN 3.5 08/06/2016 1359   AST 21 04/17/2017 1648   AST 11 08/06/2016 1359   ALT 13 (L) 04/17/2017 1648   ALT 10 08/06/2016 1359   ALKPHOS 64 04/17/2017 1648   ALKPHOS 79 08/06/2016 1359   BILITOT 1.1 04/17/2017 1648   BILITOT 0.57 08/06/2016 1359   GFRNONAA 46 (L) 05/29/2017 0523   GFRAA 53 (L) 05/29/2017 0523   Lipase     Component Value Date/Time   LIPASE 31 05/26/2017 1702   Studies/Results: Dg Chest Port 1 View  Result Date: 05/29/2017 CLINICAL DATA:  Hemothorax. EXAM: PORTABLE CHEST 1 VIEW COMPARISON:  05/28/2017. FINDINGS: Left chest tube in stable position. No pneumothorax. Stable left-sided subcutaneous emphysema. Heart size normal. Basilar atelectasis unchanged. Cervical spine fusion. IMPRESSION: 1. Left chest tube in stable position. No pneumothorax. Stable left chest wall mild subcutaneous emphysema. 2. Low lung volumes with basilar atelectasis . No pleural effusion noted on today's exam. 3.  Persistent moderate gastric distention. Electronically Signed   By: Marcello Moores  Register   On: 05/29/2017 07:25   Anti-infectives: Anti-infectives    None     Assessment/Plan Fall from ground level L rib 3-9 fractures with large hemothorax -  chest tube to water seal -  10cc/24h, D/C chest tube. - CXR: no ptx  - pain control - scheduled robaxin, scheduled tylenol, tramadol 100 mg q6 PRN - pulm toilet, IS Urinary Retention - foley placed 7/25, voiding trial today - urecholine 10 mg TID Anxiety - home xanax FEN- HH/carb mod VTE- SCDs, no lovenox due to hemothorax  Dispo: D/C chest tube, CBC and CXR this afternoon, voiding trial PT/OT rec SNF   LOS: 4 days    Frank Kramer , [facility] 05/30/2017, 7:36 AM Pager: [phone] Consults: [phone] Mon-Fri 7:00 am-4:30 pm Sat-Sun 7:00 am-11:30 am

## 2017-05-31 ENCOUNTER — Inpatient Hospital Stay (HOSPITAL_COMMUNITY): Payer: Medicare Other

## 2017-05-31 LAB — CBC
HCT: 25.7 % — ABNORMAL LOW (ref 39.0–52.0)
HEMOGLOBIN: 9.1 g/dL — AB (ref 13.0–17.0)
MCH: 32 pg (ref 26.0–34.0)
MCHC: 35.4 g/dL (ref 30.0–36.0)
MCV: 90.5 fL (ref 78.0–100.0)
PLATELETS: 139 10*3/uL — AB (ref 150–400)
RBC: 2.84 MIL/uL — AB (ref 4.22–5.81)
RDW: 13.7 % (ref 11.5–15.5)
WBC: 6 10*3/uL (ref 4.0–10.5)

## 2017-05-31 NOTE — Progress Notes (Signed)
    CC:  fall  Subjective: CT is out, biggest complaint is his abdomen is distended, I/O cath reported last PM will scan bladder.  I dont see a film for this Am  Objective: Vital signs in last 24 hours: Temp:  [97.6 F (36.4 C)-97.8 F (36.6 C)] 97.8 F (36.6 C) (07/28 0452) Pulse Rate:  [69-78] 78 (07/28 0452) Resp:  [19] 19 (07/28 0452) BP: (123-152)/(41-66) 147/55 (07/28 0452) SpO2:  [93 %-96 %] 96 % (07/28 0452) Last BM Date: 05/30/17 1080 PO 1200 urine Stool x 3 Afebrile, VSS Cbc is stable CXR yesterday:  1. No pneumothorax following left chest tube removal. 2. Mildly increased mild left basilar atelectasis. 3. Stable cardiomegaly and left rib fractures.  Intake/Output from previous day: 07/27 0701 - 07/28 0700 In: 1080 [P.O.:1080] Out: 1200 [Urine:1200] Intake/Output this shift: No intake/output data recorded.  General appearance: alert, cooperative, no distress and alert, cooperative, no distress and Frail, elderly gentleman no acute distress  Resp: [place] in the base on right, decreased BS on the left  GI: soft, mildly distended  Lab Results:   Recent Labs  05/29/17 0523 05/31/17 0551  WBC 6.9 6.0  HGB 8.9* 9.1*  HCT 25.2* 25.7*  PLT 123* 139*    BMET  Recent Labs  05/29/17 0523 05/30/17 1627  NA 127* 130*  K 3.8 4.0  CL 100* 104  CO2 22 19*  GLUCOSE 102* 110*  BUN 21* 19  CREATININE 1.30* 1.03  CALCIUM 8.2* 8.5*   PT/INR No results for input(s): LABPROT, INR in the last 72 hours.  No results for input(s): AST, ALT, ALKPHOS, BILITOT, PROT, ALBUMIN in the last 168 hours.   Lipase     Component Value Date/Time   LIPASE 31 05/26/2017 1702     Medications: . acetaminophen  500 mg Oral Q4H  . bethanechol  10 mg Oral TID  . carvedilol  25 mg Oral BID WC  . finasteride  5 mg Oral Daily  . mouth rinse  15 mL Mouth Rinse BID  . methocarbamol  1,000 mg Oral Q8H  . pantoprazole  40 mg Oral Daily  . sodium chloride  1 g Oral TID WC   . tamsulosin  0.4 mg Oral Daily    Assessment/Plan Fall from ground level L rib 3-9 fractures with large hemothorax -  chest tube to water seal - 10cc/24h, D/C chest tube. - CXR: no ptx  - pain control - scheduled robaxin, scheduled tylenol, tramadol 100 mg q6 PRN - pulm toilet, IS Urinary Retention - foley placed 7/25, voiding trial today - urecholine 10 mg TID Anxiety - home xanax FEN- HH/carb mod VTE- SCDs, no lovenox due to hemothorax  Dispo: check bladder scan, he may need a foley, add Flomax.  Check CXR and see how lungs look today.  PT/OT rec SNF CXR:  Continued mild left base atelectasis. No pneumothorax. Heart is mildly enlarged. Calcified aorta. Right lung is clear.     LOS: 5 days    Frank Kramer 05/31/2017 [phone]

## 2017-05-31 NOTE — Progress Notes (Signed)
Pt reports ongoing discomfort even after voiding some. 283 left in bladder after he voids.  Will insert foley and convert to Flomax.

## 2017-05-31 NOTE — Progress Notes (Signed)
Pt transfer to x-ray. 

## 2017-06-01 NOTE — Progress Notes (Signed)
    CC:  fall  Subjective: In bed still eating , cheerios in his coffee.  Says his daughters are mad at him, and he wants them to come see him.  Breathing is better, no complaints of abdominal pain today.  Foley is in.    Objective: Vital signs in last 24 hours: Temp:  [97.9 F (36.6 C)-98.7 F (37.1 C)] 98.6 F (37 C) (07/29 0548) Pulse Rate:  [65-96] 96 (07/29 0548) Resp:  [18-19] 18 (07/29 0548) BP: (119-161)/(51-75) 151/75 (07/29 0548) SpO2:  [93 %-97 %] 96 % (07/29 0548) Last BM Date: 05/30/17 240 PO 500 IV Urine 1600 BM x 1 Afebrile, VSS NO labs  Last Na 130 CContinued mild left base atelectasis. No pneumothorax. Heart is mildly enlarged. Calcified aorta. Right lung is clear.XR 7/28:    Intake/Output from previous day: 07/28 0701 - 07/29 0700 In: 694.7 [P.O.:240; I.V.:454.7] Out: 1600 [Urine:1600] Intake/Output this shift: No intake/output data recorded.  General appearance: alert, cooperative, no distress and anxious, never enough help for him.  Resp: lungs much clearer this AM, says he is doing IS.  site OK GI: soft, non-tender; bowel sounds normal; no masses,  no organomegaly and foley in place  Lab Results:   Recent Labs  05/31/17 0551  WBC 6.0  HGB 9.1*  HCT 25.7*  PLT 139*    BMET  Recent Labs  05/30/17 1627  NA 130*  K 4.0  CL 104  CO2 19*  GLUCOSE 110*  BUN 19  CREATININE 1.03  CALCIUM 8.5*   PT/INR No results for input(s): LABPROT, INR in the last 72 hours.  No results for input(s): AST, ALT, ALKPHOS, BILITOT, PROT, ALBUMIN in the last 168 hours.   Lipase     Component Value Date/Time   LIPASE 31 05/26/2017 1702     Medications: . acetaminophen  500 mg Oral Q4H  . bethanechol  10 mg Oral TID  . carvedilol  25 mg Oral BID WC  . finasteride  5 mg Oral Daily  . mouth rinse  15 mL Mouth Rinse BID  . methocarbamol  1,000 mg Oral Q8H  . pantoprazole  40 mg Oral Daily  . sodium chloride  1 g Oral TID WC  . tamsulosin  0.4 mg  Oral Daily   . sodium chloride 10 mL/hr at 05/29/17 1033   Anti-infectives    None      Assessment/Plan Fall from ground level L rib 3-9 fractures with large hemothorax - chest tube to water seal - 10cc/24h,D/C chest tube. - CXR: no ptx  - pain control - scheduled robaxin, scheduled tylenol, tramadol 100 mg q6 PRN - pulm toilet, IS Urinary Retention - foley placed 7/25, replaced 05/31/17  - urecholine 10 mg TID  - flomax 0.4 QD Anxiety - home xanax FEN- HH/carb mod VTE- SCDs, no lovenox due to hemothorax ID:  None     Dispo: PT/OT SNF,  Try him with foley out tomorrow and see if we can get him to SNF after that.          LOS: 6 days    Erica Osuna 06/01/2017 [phone]

## 2017-06-02 MED ORDER — ACETAMINOPHEN 500 MG PO TABS: 500.0000 mg | ORAL_TABLET | ORAL | 0 refills | Status: AC | PRN

## 2017-06-02 MED ORDER — BETHANECHOL CHLORIDE 10 MG PO TABS: 10.0000 mg | ORAL_TABLET | Freq: Three times a day (TID) | ORAL | 0 refills | Status: AC

## 2017-06-02 MED ORDER — SODIUM CHLORIDE 1 G PO TABS: 1.0000 g | ORAL_TABLET | Freq: Three times a day (TID) | ORAL | Status: AC

## 2017-06-02 MED ORDER — ENOXAPARIN SODIUM 40 MG/0.4ML ~~LOC~~ SOLN
40.0000 mg | SUBCUTANEOUS | Status: DC
  Administered 2017-06-02: 40 mg via SUBCUTANEOUS
  Filled 2017-06-02: qty 0.4

## 2017-06-02 MED ORDER — TRAMADOL HCL 50 MG PO TABS: 100.0000 mg | ORAL_TABLET | Freq: Four times a day (QID) | ORAL | 0 refills | Status: DC | PRN

## 2017-06-02 MED ORDER — METHOCARBAMOL 500 MG PO TABS: 750.0000 mg | ORAL_TABLET | Freq: Three times a day (TID) | ORAL | 0 refills | Status: DC | PRN

## 2017-06-02 NOTE — Care Management Important Message (Signed)
Important Message  Patient Details  Name: Frank Kramer MRN: [id number] Date of Birth: [birth year]   Medicare Important Message Given:  Yes    [name] 06/02/2017, 3:44 PM

## 2017-06-02 NOTE — Care Management Note (Signed)
Case Management Note  Patient Details  Name: Frank Kramer MRN: [id number] Date of Birth: [birth year]  Subjective/Objective:    Pt admitted on 05/26/17 s/p ground level fall with Lt rib fx and HPTX.  PTA, pt resided at home with daughter; he has personal care attendant 2 hours/day, who assists with dressing and bathing.                  Action/Plan: PT/OT recommending SNF for rehab at dc; CSW consulted to facilitate dc to SNF upon medical stability.  Will follow.   Expected Discharge Date:  06/02/17               Expected Discharge Plan:  Skilled Nursing Facility  In-House Referral:  Clinical Social Work  Discharge planning Services  CM Consult  Post Acute Care Choice:    Choice offered to:     DME Arranged:    DME Agency:     HH Arranged:    [place] Agency:     Status of Service:  Completed, signed off  If discussed at [employer], dates discussed:    Additional Comments:  7/30//18 J. Malakai Schoenherr, RN, BSN Pt medically stable for discharge today.  Plan dc to SNF per CSW arrangements.    Reinaldo Raddle, RN, BSN  Trauma/Neuro ICU Case Manager [phone]

## 2017-06-02 NOTE — Clinical Social Work Note (Signed)
Clinical Social Worker facilitated patient discharge including contacting patient family and facility to confirm patient discharge plans.  Clinical information faxed to facility and family agreeable with plan.  CSW arranged ambulance transport via [facility] to [facility].  RN to call [phone] report prior to discharge. Patient will go to room 3225.  Clinical Social Worker will sign off for now as social work intervention is no longer needed. Please consult [place] again if new need arises.  [place], [place] [employer]

## 2017-06-02 NOTE — Progress Notes (Signed)
Physical Therapy Treatment Patient Details Name: Frank Kramer MRN: [id number] DOB: Jun 20, 1922 Today's Date: 06/02/2017    History of Present Illness This 83 y.o. male admitted 05/27/17 after ground level fall at home.  He sustained  Lt sided rib fractures (3-9) with bone fragment near descending thoracic aorty and  hemopneumothorax - chest tube placed, removed 7/27.   PMHx includes:  back pain, C-diff, CAD. h/o lymphoma, HTN, panic disorder    PT Comments    Upon arrival pt states he is hesitant to get OOB because he is "unsure he will be able to." However, pt agrees to mobilize with PT. Pt requires minimal amount of physical assist for bed mobility and transfers; however pt requires +2 for safety with transfers d/t pt's anxiety with mobilizing. Pt is progressing with mobilization, as he was able to ambulate this session with RW but + 2 assist required for safety and reassurance. Will continue to follow for increased mobility and strengthening for safe d/c.   Vitals:  At rest HR 69 and SpO2 93% on RA    Follow Up Recommendations  SNF;Supervision/Assistance - 24 hour     Equipment Recommendations  None recommended by PT    Recommendations for Other Services       Precautions / Restrictions Precautions Precautions: Fall Restrictions Weight Bearing Restrictions: No    Mobility  Bed Mobility Overal bed mobility: Needs Assistance Bed Mobility: Supine to Sit     Supine to sit: Min assist     General bed mobility comments: assist to bring legs off of bed and elevate trunk   Transfers Overall transfer level: Needs assistance   Transfers: Sit to/from Stand Sit to Stand: Min assist Stand pivot transfers: Min assist;+2 safety/equipment       General transfer comment: cues for hand placement and sequence, pt not reaching for chair despite cues. pt stood from bed, recliner and BSC +2 for equipment and safety as well as pt reassurance  Ambulation/Gait Ambulation/Gait  assistance: Mod assist;+2 safety/equipment Ambulation Distance (Feet): 4 Feet Assistive device: Rolling walker (2 wheeled) Gait Pattern/deviations: Shuffle;Trunk flexed;Narrow base of support   Gait velocity interpretation: Below normal speed for age/gender General Gait Details: cues for posture, safety and use of RW, assist to direct and move RW with chair to follow, encouragement to maximize mobility   Stairs            Wheelchair Mobility    Modified Rankin (Stroke Patients Only)       Balance Overall balance assessment: Needs assistance   Sitting balance-Leahy Scale: Fair       Standing balance-Leahy Scale: Poor                              Cognition Arousal/Alertness: Awake/alert Behavior During Therapy: WFL for tasks assessed/performed Overall Cognitive Status: Impaired/Different from baseline Area of Impairment: Memory;Safety/judgement                               General Comments: pt self limiting and convinced he can't walk, oriented to person place and time, nervous about mobility      Exercises General Exercises - Lower Extremity Long Arc Quad: AAROM;10 reps;Both;Seated Hip Flexion/Marching: AAROM;Both;Seated;10 reps    General Comments        Pertinent Vitals/Pain Pain Assessment: 0-10 Pain Score: 8  Pain Location: left side Pain Descriptors / Indicators: Sore Pain Intervention(s): Limited  activity within patient's tolerance;Repositioned;Monitored during session    Home Living                      Prior Function            PT Goals (current goals can now be found in the care plan section) Progress towards PT goals: Progressing toward goals    Frequency           PT Plan Current plan remains appropriate    Co-evaluation              AM-PAC PT "6 Clicks" Daily Activity  Outcome Measure  Difficulty turning over in bed (including adjusting bedclothes, sheets and blankets)?: A  Lot Difficulty moving from lying on back to sitting on the side of the bed? : Total Difficulty sitting down on and standing up from a chair with arms (e.g., wheelchair, bedside commode, etc,.)?: Total Help needed moving to and from a bed to chair (including a wheelchair)?: A Lot Help needed walking in hospital room?: A Lot Help needed climbing 3-5 steps with a railing? : Total 6 Click Score: 9    End of Session Equipment Utilized During Treatment: Gait belt Activity Tolerance: Patient tolerated treatment well Patient left: in chair;with chair alarm set;with call bell/phone within reach;with nursing/sitter in room Nurse Communication: Mobility status;Precautions;Other (comment) (sequence for mobility and transfers) PT Visit Diagnosis: Muscle weakness (generalized) (M62.81);History of falling (Z91.81);Difficulty in walking, not elsewhere classified (R26.2)     Time: 4801-6553 PT Time Calculation (min) (ACUTE ONLY): 38 min  Charges:  $Gait Training: 8-22 mins $Therapeutic Activity: 23-37 mins                    G Codes:       Elberta Leatherwood, SPT Acute Rehab [place] 06/02/2017, 12:58 PM

## 2017-06-02 NOTE — Progress Notes (Signed)
Patient Discharge: Disposition: Patient discharged to [name] report to the nurse [name] at the facility and answered all his questions. IV: Discontinued IV before discharge. Telemetry: Discontinued Tele, CCMD notified. Transportation:  Patient transported by EMS, took all his belongings with him.Frank Kramer

## 2017-06-02 NOTE — Discharge Instructions (Signed)

## 2017-06-02 NOTE — Progress Notes (Signed)
[facility] Progress Note     Subjective: CC:  L chest wall pain is present but improving. Able to take deep breaths and pull >1000 cc on IS. Tolerating PO. Improving with PT. Foley in place   Afebrile, VSS Objective: Vital signs in last 24 hours: Temp:  [97.6 F (36.4 C)-98.3 F (36.8 C)] 98.3 F (36.8 C) (07/29 2050) Pulse Rate:  [80-85] 80 (07/29 2050) Resp:  [18] 18 (07/29 2050) BP: (156-166)/(58-64) 156/58 (07/29 2050) SpO2:  [95 %-98 %] 95 % (07/29 2050) Last BM Date: 06/01/17  Intake/Output from previous day: 07/29 0701 - 07/30 0700 In: 880 [P.O.:880] Out: 1975 [Urine:1975] Intake/Output this shift: No intake/output data recorded.  PE: Gen:  Alert, NAD, pleasant HEENT: EOMs in tact, no scleral icterus Card:  Regular rate and rhythm, radial pulses 2+ BL Pulm:  Normal effort, clear to auscultation bilaterally Abd: Soft, non-tender, non-distended GU: foley in place; UOP 1,975 mL/24h Skin: warm and dry, no rashes  Psych: A&Ox3   Lab Results:   Recent Labs  05/31/17 0551  WBC 6.0  HGB 9.1*  HCT 25.7*  PLT 139*   BMET  Recent Labs  05/30/17 1627  NA 130*  K 4.0  CL 104  CO2 19*  GLUCOSE 110*  BUN 19  CREATININE 1.03  CALCIUM 8.5*   PT/INR No results for input(s): LABPROT, INR in the last 72 hours. CMP     Component Value Date/Time   NA 130 (L) 05/30/2017 1627   NA 140 08/06/2016 1359   K 4.0 05/30/2017 1627   K 4.2 08/06/2016 1359   CL 104 05/30/2017 1627   CL 109 (H) 01/12/2013 0952   CO2 19 (L) 05/30/2017 1627   CO2 26 08/06/2016 1359   GLUCOSE 110 (H) 05/30/2017 1627   GLUCOSE 124 08/06/2016 1359   GLUCOSE 90 01/12/2013 0952   BUN 19 05/30/2017 1627   BUN 12.1 08/06/2016 1359   CREATININE 1.03 05/30/2017 1627   CREATININE 1.2 08/06/2016 1359   CALCIUM 8.5 (L) 05/30/2017 1627   CALCIUM 9.6 08/06/2016 1359   PROT 6.7 04/17/2017 1648   PROT 6.4 08/06/2016 1359   PROT 6.7 08/06/2016 1359   ALBUMIN 3.6 04/17/2017 1648    ALBUMIN 3.5 08/06/2016 1359   AST 21 04/17/2017 1648   AST 11 08/06/2016 1359   ALT 13 (L) 04/17/2017 1648   ALT 10 08/06/2016 1359   ALKPHOS 64 04/17/2017 1648   ALKPHOS 79 08/06/2016 1359   BILITOT 1.1 04/17/2017 1648   BILITOT 0.57 08/06/2016 1359   GFRNONAA >60 05/30/2017 1627   GFRAA >60 05/30/2017 1627   Lipase     Component Value Date/Time   LIPASE 31 05/26/2017 1702       Studies/Results: Dg Chest 2 View  Result Date: 05/31/2017 CLINICAL DATA:  Left hemo pneumothorax EXAM: CHEST  2 VIEW COMPARISON:  05/30/2017 FINDINGS: Continued mild left base atelectasis. No pneumothorax. Heart is mildly enlarged. Calcified aorta. Right lung is clear. IMPRESSION: Continued left base atelectasis.  No pneumothorax. Cardiomegaly.  Aortic atherosclerosis. Electronically Signed   By: Rolm Baptise M.D.   On: 05/31/2017 10:58    Anti-infectives: Anti-infectives    None       Assessment/Plan Fall from ground level L rib 3-9 fractures with large hemothorax - chest tube D/C-ed 7/27, no residual PTX on CXR, atelectasis at L base - pain control - scheduled robaxin, scheduled tylenol, tramadol 100 mg q6 PRN - pulm toilet, IS Urinary Retention - foley placed  7/25, replaced 05/31/17  - urecholine 10 mg TID, flomax 0.4 QD Anxiety - home xanax FEN- HH/carb mod VTE- SCDs, start lovenox, CBC in AM ID:  None    Dispo: re-attempt voiding trial  PT/OT, SNF when medically stable     LOS: 7 days    Jill Alexanders , [facility] 06/02/2017, 7:52 AM Pager: [phone] Consults: [phone] Mon-Fri 7:00 am-4:30 pm Sat-Sun 7:00 am-11:30 am

## 2017-06-02 NOTE — Discharge Summary (Signed)
[facility] Discharge Summary   Patient ID: Frank Kramer MRN: [id number] DOB/AGE: February 23, 1922 83 y.o.  Admit date: 05/26/2017 Discharge date: 06/02/2017  Discharge Diagnosis Patient Active Problem List   Diagnosis Date Noted  . Hemopneumothorax on left 05/26/2017  . Ground level fall    . Urinary retention    . Multiple fractures of ribs, left side, initial encounter for closed fracture 05/26/2017  . Pancytopenia, acquired ([place]) 08/13/2016  . Dyslipidemia 05/10/2015  . Back pain 07/18/2014  . Thrombocytopenia, unspecified ([place]) 07/18/2014  . Palpitations 02/18/2014  . Basal cell carcinoma of face 07/20/2013  . Hx of lymphoma, non-Hodgkins 07/19/2013  . Acute blood loss anemia 12/15/2012    Consultants Social Work Case management  Procedures 05/27/17 - left chest tube insertion, Dr. Mallie Darting Course:  Mr. Soffer is a 83 year old male who presented to [facility] emergency room from [facility] after a ground level fall. The patient lost his footing in his left ribs on table. His workup was significant for multiple left-sided rib fractures and hemopneumothorax. He was admitted to the trauma service for further management and pain control. A left pigtail chest tube was placed. Follow-up x-rays confirmed resolution of pneumothorax and chest tube output gradually decreased. Chest tube was discontinued on 05/30/17. The patient's hospitalization was complicated by urinary retention and the patient was started on Flomax and Urecholine. On 06/02/17 the patient's vitals were stable, pain controlled, tolerating oral intake, having bowel function, mobilizing with therapies and medically stable for discharge to skilled nursing facility. He will require outpatient follow-up in our trauma clinic in 2 weeks and should call to confirm appointment date and time. The patient should continue to mobilize with therapies and use his incentive spirometer while at the skilled  nursing facility.  Allergies as of 06/02/2017      Reactions   Aspirin    GI bleeding   Ciprofloxacin Other (See Comments)   Caused GI bleed   Codeine Other (See Comments)   Patient cannot recall reaction   Iohexol     Desc: HAS SEIZURES WITH X-RAY DYE-GIVEN 120 MG SOLU0MEDROL, 25 MG BENADRYL, AND 25 MG PEPCID 1 HOUR PRIOR TO EXAM AND HAD NO PROBLEMS-ARS-08/15/07   Macrodantin Other (See Comments)   Patient cannot recall reaction   Morphine And Related Itching      Medication List    STOP taking these medications   butalbital-acetaminophen-caffeine 50-325-40 MG tablet Commonly known as:  FIORICET, ESGIC   EXCEDRIN TENSION HEADACHE 500-65 MG Tabs Generic drug:  Acetaminophen-Caffeine   HYDROcodone-acetaminophen 5-325 MG tablet Commonly known as:  NORCO/VICODIN     TAKE these medications   acetaminophen 500 MG tablet Commonly known as:  TYLENOL Take 1 tablet (500 mg total) by mouth every 4 (four) hours as needed for mild pain, moderate pain, fever or headache. What changed:  when to take this  reasons to take this   ALPRAZolam 0.5 MG tablet Commonly known as:  XANAX Take 1 tablet (0.5 mg total) by mouth 2 (two) times daily as needed. anxiety What changed:  when to take this  reasons to take this  additional instructions   amLODipine 10 MG tablet Commonly known as:  NORVASC TAKE 0.5 TABLET (5 MG) BY MOUTH BID   bethanechol 10 MG tablet Commonly known as:  URECHOLINE Take 1 tablet (10 mg total) by mouth 3 (three) times daily.   carvedilol 25 MG tablet Commonly known as:  COREG TAKE 1 TABLET TWICE A DAY (  NEED APPOINTMENT FOR FURTHER REFILLS, CALL [phone])   cetirizine 10 MG tablet Commonly known as:  ZYRTEC Take 0.5 tablets (5 mg total) by mouth daily.   cholecalciferol 1000 units tablet Commonly known as:  VITAMIN D Take 1,000 Units by mouth daily.   enalapril 10 MG tablet Commonly known as:  VASOTEC Take 10 mg by mouth 2 (two) times daily.    finasteride 5 MG tablet Commonly known as:  PROSCAR Take 5 mg by mouth daily.   fluocinonide ointment 0.05 % Commonly known as:  LIDEX Apply 1 application topically 2 (two) times daily as needed (rash). rash   ipratropium 0.06 % nasal spray Commonly known as:  ATROVENT USE 2 SPRAYS IN EACH NOSTRIL THREE TIMES A DAY AS NEEDED FOR ALLERGIES   lidocaine 5 % Commonly known as:  LIDODERM Place 1 patch onto the skin daily. Remove & Discard patch within 12 hours or as directed by MD   methocarbamol 500 MG tablet Commonly known as:  ROBAXIN Take 1.5 tablets (750 mg total) by mouth every 8 (eight) hours as needed for muscle spasms.   polyethylene glycol packet Commonly known as:  MIRALAX / GLYCOLAX Take 17 g by mouth daily as needed for moderate constipation.   RAPAFLO 8 MG Caps capsule Generic drug:  silodosin Take 8 mg by mouth daily with breakfast.   sodium chloride 1 g tablet Take 1 tablet (1 g total) by mouth 3 (three) times daily with meals.   traMADol 50 MG tablet Commonly known as:  ULTRAM Take 2 tablets (100 mg total) by mouth every 6 (six) hours as needed for moderate pain.       Contact information for follow-up providers    [place]. Go on 06/12/2017.   Why:  at 9:45 AM for follow up from recent hospitalization and chest tube placement. please arrive 30 minutes early. Contact information: [place] [id number] [phone]           Contact information for after-discharge care    Destination    [facility] Follow up.   Specialty:  [place] information: [place] [phone]                  Signed: Obie Dredge, [facility] 06/02/2017, 12:55 PM Pager: [phone] Consults: [phone] Mon-Fri 7:00 am-4:30 pm Sat-Sun 7:00 am-11:30 am

## 2017-06-14 ENCOUNTER — Emergency Department (HOSPITAL_COMMUNITY): Payer: Medicare Other

## 2017-06-14 ENCOUNTER — Emergency Department (HOSPITAL_COMMUNITY)
Admission: EM | Admit: 2017-06-14 | Discharge: 2017-06-14 | Disposition: A | Discharge status: Home / Self Care | Payer: Medicare Other | Attending: Emergency Medicine | Admitting: Emergency Medicine

## 2017-06-14 ENCOUNTER — Encounter (HOSPITAL_COMMUNITY): Payer: Self-pay | Admitting: Emergency Medicine

## 2017-06-14 DIAGNOSIS — I10 Essential (primary) hypertension: Secondary | ICD-10-CM | POA: Diagnosis not present

## 2017-06-14 DIAGNOSIS — Y999 Unspecified external cause status: Secondary | ICD-10-CM | POA: Diagnosis not present

## 2017-06-14 DIAGNOSIS — Z87891 Personal history of nicotine dependence: Secondary | ICD-10-CM | POA: Insufficient documentation

## 2017-06-14 DIAGNOSIS — I259 Chronic ischemic heart disease, unspecified: Secondary | ICD-10-CM | POA: Diagnosis not present

## 2017-06-14 DIAGNOSIS — Z79899 Other long term (current) drug therapy: Secondary | ICD-10-CM | POA: Insufficient documentation

## 2017-06-14 DIAGNOSIS — S52022A Displaced fracture of olecranon process without intraarticular extension of left ulna, initial encounter for closed fracture: Secondary | ICD-10-CM | POA: Insufficient documentation

## 2017-06-14 DIAGNOSIS — W0110XA Fall on same level from slipping, tripping and stumbling with subsequent striking against unspecified object, initial encounter: Secondary | ICD-10-CM | POA: Insufficient documentation

## 2017-06-14 DIAGNOSIS — R0789 Other chest pain: Secondary | ICD-10-CM | POA: Diagnosis not present

## 2017-06-14 DIAGNOSIS — F0390 Unspecified dementia without behavioral disturbance: Secondary | ICD-10-CM | POA: Diagnosis not present

## 2017-06-14 DIAGNOSIS — Y9289 Other specified places as the place of occurrence of the external cause: Secondary | ICD-10-CM | POA: Insufficient documentation

## 2017-06-14 DIAGNOSIS — S59902A Unspecified injury of left elbow, initial encounter: Secondary | ICD-10-CM

## 2017-06-14 DIAGNOSIS — Y9389 Activity, other specified: Secondary | ICD-10-CM | POA: Insufficient documentation

## 2017-06-14 DIAGNOSIS — Z85828 Personal history of other malignant neoplasm of skin: Secondary | ICD-10-CM | POA: Diagnosis not present

## 2017-06-14 LAB — BASIC METABOLIC PANEL
Anion gap: 8 (ref 5–15)
BUN: 21 mg/dL — AB (ref 6–20)
CHLORIDE: 107 mmol/L (ref 101–111)
CO2: 24 mmol/L (ref 22–32)
CREATININE: 1.3 mg/dL — AB (ref 0.61–1.24)
Calcium: 9 mg/dL (ref 8.9–10.3)
GFR calc Af Amer: 53 mL/min — ABNORMAL LOW (ref >60)
GFR calc non Af Amer: 46 mL/min — ABNORMAL LOW (ref >60)
GLUCOSE: 126 mg/dL — AB (ref 65–99)
POTASSIUM: 3.9 mmol/L (ref 3.5–5.1)
SODIUM: 139 mmol/L (ref 135–145)

## 2017-06-14 LAB — CBC WITH DIFFERENTIAL/PLATELET
Basophils Absolute: 0 10*3/uL (ref 0.0–0.1)
Basophils Relative: 0 %
EOS PCT: 1 %
Eosinophils Absolute: 0 10*3/uL (ref 0.0–0.7)
HEMATOCRIT: 26.8 % — AB (ref 39.0–52.0)
Hemoglobin: 9.1 g/dL — ABNORMAL LOW (ref 13.0–17.0)
LYMPHS ABS: 0.8 10*3/uL (ref 0.7–4.0)
LYMPHS PCT: 12 %
MCH: 32.4 pg (ref 26.0–34.0)
MCHC: 34 g/dL (ref 30.0–36.0)
MCV: 95.4 fL (ref 78.0–100.0)
MONO ABS: 0.4 10*3/uL (ref 0.1–1.0)
Monocytes Relative: 6 %
NEUTROS ABS: 5.2 10*3/uL (ref 1.7–7.7)
Neutrophils Relative %: 81 %
Platelets: 200 10*3/uL (ref 150–400)
RBC: 2.81 MIL/uL — ABNORMAL LOW (ref 4.22–5.81)
RDW: 14.9 % (ref 11.5–15.5)
WBC: 6.4 10*3/uL (ref 4.0–10.5)

## 2017-06-14 MED ORDER — ACETAMINOPHEN 500 MG PO TABS
1000.0000 mg | ORAL_TABLET | Freq: Once | ORAL | Status: AC
  Administered 2017-06-14: 1000 mg via ORAL
  Filled 2017-06-14: qty 2

## 2017-06-14 NOTE — ED Provider Notes (Signed)
[facility] DEPT Provider Note   CSN: [id number] Arrival date & time: 06/14/17  1348     History   Chief Complaint Chief Complaint  Patient presents with  . Fall  . Elbow Pain  . Rib Pain    HPI Frank Kramer is a 83 y.o. male.  83 yo M with a chief complaints of left elbow pain. The patient got up to ambulate yesterday and had a witnessed fall. He thinks he lost his balance. This been an ongoing issue for him. He fell about a week ago and having multiple broken ribs and pneumothorax and was admitted to the hospital. The patient had a plain film performed at his nursing home yesterday that was initially thought to be negative. It was over read this morning by the radiologist and he was noted that he had a fracture. Sent here for further management. The patient is demented but has no complaints. Level V caveat dementia.   The history is provided by the patient and a relative.  Fall  This is a recurrent problem. The current episode started yesterday. The problem occurs rarely. The problem has not changed since onset.Pertinent negatives include no chest pain, no abdominal pain, no headaches and no shortness of breath. Nothing aggravates the symptoms. Nothing relieves the symptoms. He has tried nothing for the symptoms. The treatment provided no relief.    Past Medical History:  Diagnosis Date  . Arthritis   . Back pain 07/18/2014  . Basal cell carcinoma of face 07/20/2013   3 lesions excised Dr Link Snuffer approx  7/14  . C. difficile colitis   . CAD (coronary artery disease) 02/08/2009   LHC: dilated ectatic segment in the midportion of the LAD with mild to mod. narrowing just distal to this.  . Clostridium difficile infection   . GERD (gastroesophageal reflux disease)   . High cholesterol   . Hx of lymphoma, non-Hodgkins 07/19/2013  . Hypertension   . MGUS (monoclonal gammopathy of unknown significance) 06/07/2012  . Non Hodgkin's lymphoma ([place]) dx'd 1997  . Paget's bone disease     . Panic    panic disorder  . Rhinitis 06/07/2012  . Skin cancer     Patient Active Problem List   Diagnosis Date Noted  . Hemopneumothorax on left 05/26/2017  . Multiple fractures of ribs, left side, initial encounter for closed fracture 05/26/2017  . Pancytopenia, acquired ([place]) 08/13/2016  . Weight loss 08/13/2016  . Chronic diarrhea 08/13/2016  . Acute lower GI bleeding 04/19/2016  . Dyslipidemia 05/10/2015  . Midline low back pain without sciatica 08/09/2014  . Sinusitis 08/09/2014  . Back pain 07/18/2014  . Thrombocytopenia, unspecified ([place]) 07/18/2014  . Palpitations 02/18/2014  . Basal cell carcinoma of face 07/20/2013  . Hx of lymphoma, non-Hodgkins 07/19/2013  . Acute blood loss anemia 12/15/2012  . C. difficile colitis 12/10/2012  . Dehydration 12/10/2012  . Orthostatic hypotension 12/10/2012  . Paget's bone disease 12/04/2012  . Colitis 12/04/2012  . LGI bleed 12/04/2012  . MGUS (monoclonal gammopathy of unknown significance) 06/07/2012  . Rhinitis 06/07/2012  . Rectal bleeding 11/21/2011  . HTN (hypertension) 11/21/2011  . GERD (gastroesophageal reflux disease) 11/21/2011  . Anxiety disorder 11/21/2011  . Arthritis 11/21/2011  . Anemia in neoplastic disease 11/21/2011    Past Surgical History:  Procedure Laterality Date  . BACK SURGERY    . CARDIAC CATHETERIZATION  02/08/2009   nonocclusive CAD  . COLONOSCOPY N/A 12/15/2012   Procedure: COLONOSCOPY;  Surgeon: Missy Sabins,  MD;  Location: [facility];  Service: Endoscopy;  Laterality: N/A;  . HERNIA REPAIR    . NECK SURGERY     c2  . PROSTATECTOMY    . SKIN BIOPSY         Home Medications    Prior to Admission medications   Medication Sig Start Date End Date Taking? Authorizing Provider  acetaminophen (TYLENOL) 500 MG tablet Take 1 tablet (500 mg total) by mouth every 4 (four) hours as needed for mild pain, moderate pain, fever or headache. 06/02/17   Jill Alexanders, PA-C  ALPRAZolam Duanne Moron)  0.5 MG tablet Take 1 tablet (0.5 mg total) by mouth 2 (two) times daily as needed. anxiety Patient taking differently: Take 0.5 mg by mouth at bedtime as needed for anxiety.  12/15/12   Dellinger, Bobby Rumpf, PA-C  amLODipine (NORVASC) 10 MG tablet TAKE 0.5 TABLET (5 MG) BY MOUTH BID 02/07/17   [provider]  carvedilol (COREG) 25 MG tablet TAKE 1 TABLET TWICE A DAY (NEED APPOINTMENT FOR FURTHER REFILLS, CALL [phone]) 07/22/16   Hilty, Nadean Corwin, MD  cetirizine (ZYRTEC) 10 MG tablet Take 0.5 tablets (5 mg total) by mouth daily. Patient not taking: Reported on 05/26/2017 07/19/13   Annia Belt, MD  cholecalciferol (VITAMIN D) 1000 UNITS tablet Take 1,000 Units by mouth daily.    [provider]  enalapril (VASOTEC) 10 MG tablet Take 10 mg by mouth 2 (two) times daily.     [provider]  finasteride (PROSCAR) 5 MG tablet Take 5 mg by mouth daily. 02/14/14   [provider]  fluocinonide ointment (LIDEX) 5.63 % Apply 1 application topically 2 (two) times daily as needed (rash). rash     [provider]  ipratropium (ATROVENT) 0.06 % nasal spray USE 2 SPRAYS IN EACH NOSTRIL THREE TIMES A DAY AS NEEDED FOR ALLERGIES 12/11/16   [provider]  lidocaine (LIDODERM) 5 % Place 1 patch onto the skin daily. Remove & Discard patch within 12 hours or as directed by MD 07/18/14   Heath Lark, MD  methocarbamol (ROBAXIN) 500 MG tablet Take 1.5 tablets (750 mg total) by mouth every 8 (eight) hours as needed for muscle spasms. 06/02/17   Jill Alexanders, PA-C  polyethylene glycol (MIRALAX / GLYCOLAX) packet Take 17 g by mouth daily as needed for moderate constipation.     [provider]  RAPAFLO 8 MG CAPS capsule Take 8 mg by mouth daily with breakfast.  04/02/17   [provider]  sodium chloride 1 g tablet Take 1 tablet (1 g total) by mouth 3 (three) times daily with meals. 06/02/17   Jill Alexanders, PA-C  traMADol (ULTRAM) 50  MG tablet Take 2 tablets (100 mg total) by mouth every 6 (six) hours as needed for moderate pain. 06/02/17   Jill Alexanders, PA-C    Family History Family History  Problem Relation Age of Onset  . Hypertension Mother   . Heart failure Mother   . Hypertension Father   . Cancer Brother   . Heart failure Sister     Social History Social History  Substance Use Topics  . Smoking status: Former Smoker    Quit date: 11/20/1968  . Smokeless tobacco: Never Used  . Alcohol use No     Allergies   Aspirin; Ciprofloxacin; Codeine; Iohexol; Macrodantin; and Morphine and related   Review of Systems Review of Systems  Constitutional: Negative for chills and fever.  HENT: Negative for  congestion and facial swelling.   Eyes: Negative for discharge and visual disturbance.  Respiratory: Negative for shortness of breath.   Cardiovascular: Negative for chest pain and palpitations.  Gastrointestinal: Negative for abdominal pain, diarrhea and vomiting.  Musculoskeletal: Positive for arthralgias and myalgias.  Skin: Negative for color change and rash.  Neurological: Negative for tremors, syncope and headaches.  Psychiatric/Behavioral: Negative for confusion and dysphoric mood.     Physical Exam Updated Vital Signs BP 137/66   Pulse 93   Temp 97.8 F (36.6 C) (Oral)   Resp 13   Ht 5\' 7"  (1.702 m)   Wt 62.6 kg (138 lb)   SpO2 97%   BMI 21.61 kg/m   Physical Exam  Constitutional: He is oriented to person, place, and time. He appears well-developed and well-nourished.  HENT:  Head: Normocephalic and atraumatic.  Eyes: Pupils are equal, round, and reactive to light. EOM are normal.  Neck: Normal range of motion. Neck supple. No JVD present.  Cardiovascular: Normal rate and regular rhythm.  Exam reveals no gallop and no friction rub.   No murmur heard. Pulmonary/Chest: No respiratory distress. He has no wheezes.  Abdominal: He exhibits no distension. There is no rebound and no  guarding.  Musculoskeletal: Normal range of motion. He exhibits edema, tenderness and deformity.  Left elbow pain.  PMS intact distally.  Significant swelling and erythema  Neurological: He is alert and oriented to person, place, and time.  Skin: No rash noted. No pallor.  Psychiatric: He has a normal mood and affect. His behavior is normal.  Nursing note and vitals reviewed.    ED Treatments / Results  Labs (all labs ordered are listed, but only abnormal results are displayed) Labs Reviewed  CBC WITH DIFFERENTIAL/PLATELET - Abnormal; Notable for the following:       Result Value   RBC 2.81 (*)    Hemoglobin 9.1 (*)    HCT 26.8 (*)    All other components within normal limits  BASIC METABOLIC PANEL - Abnormal; Notable for the following:    Glucose, Bld 126 (*)    BUN 21 (*)    Creatinine, Ser 1.30 (*)    GFR calc non Af Amer 46 (*)    GFR calc Af Amer 53 (*)    All other components within normal limits    EKG  EKG Interpretation None       Radiology Dg Chest 2 View  Result Date: 06/14/2017 CLINICAL DATA:  Initial evaluation for acute chest wall pain status post fall. EXAM: CHEST  2 VIEW COMPARISON:  Prior radiograph from 05/31/2017. FINDINGS: Mild cardiomegaly, stable. Mediastinal silhouette within normal limits. Intrathoracic aorta mildly tortuous with prominent aortic atherosclerosis. Lungs mildly hypoinflated. Mild left basilar atelectasis. No focal infiltrates. No pulmonary edema or pleural effusion. No pneumothorax. Recently identified left-sided rib fractures again noted. No definite new osseous abnormality. Osteopenia. IMPRESSION: 1. Mild left basilar atelectasis. No other active cardiopulmonary disease. 2. Left-sided rib fractures, grossly stable as compared to recent examinations. 3. Aortic atherosclerosis. Electronically Signed   By: Jeannine Boga M.D.   On: 06/14/2017 15:41   Dg Elbow Complete Left  Result Date: 06/14/2017 CLINICAL DATA:  Initial  evaluation for recent fall, trauma. EXAM: LEFT ELBOW - COMPLETE 3+ VIEW COMPARISON:  None. FINDINGS: Acute distracted fracture of the olecranon. Distal humerus intact. Radial head intact as is the proximal radius. Diffuse soft tissue swelling about the elbow. IMPRESSION: 1. Acute distracted fracture of the olecranon. 2. Extensive soft tissue  swelling about the elbow. Electronically Signed   By: Jeannine Boga M.D.   On: 06/14/2017 15:43    Procedures Procedures (including critical care time)  Medications Ordered in ED Medications  acetaminophen (TYLENOL) tablet 1,000 mg (1,000 mg Oral Given 06/14/17 1518)     Initial Impression / Assessment and Plan / ED Course  I have reviewed the triage vital signs and the nursing notes.  Pertinent labs & imaging results that were available during my care of the patient were reviewed by me and considered in my medical decision making (see chart for details).     83 yo M with a cc of left elbow pain.  X-rays nursing home was concerning for fracture. Sent here for management.   Found to have displaced olecranon fx.  Will discuss with hand surgery.   Discussed with Dr. Rhona Raider, recommends posterior splint, follow up within the week.   4:27 PM:  I have discussed the diagnosis/risks/treatment options with the patient and family and believe the pt to be eligible for discharge home to follow-up with Ortho. We also discussed returning to the ED immediately if new or worsening sx occur. We discussed the sx which are most concerning (e.g., sudden worsening pain, fever, inability to tolerate by mouth) that necessitate immediate return. Medications administered to the patient during their visit and any new prescriptions provided to the patient are listed below.  Medications given during this visit Medications  acetaminophen (TYLENOL) tablet 1,000 mg (1,000 mg Oral Given 06/14/17 1518)     The patient appears reasonably screen and/or stabilized for  discharge and I doubt any other medical condition or other [facility] requiring further screening, evaluation, or treatment in the ED at this time prior to discharge.     Final Clinical Impressions(s) / ED Diagnoses   Final diagnoses:  Injury of left elbow, initial encounter    New Prescriptions New Prescriptions   No medications on file     Deno Etienne, DO 06/14/17 1627

## 2017-06-14 NOTE — Progress Notes (Signed)
Orthopedic Tech Progress Note Patient Details:  Frank Kramer [birth year] [id number]  Ortho Devices Type of Ortho Device: Arm sling, Ace wrap, Long arm splint Ortho Device/Splint Interventions: Application   [name] 06/14/2017, 4:46 PM

## 2017-06-14 NOTE — ED Notes (Signed)
[facility] contacted to transport patient to [facility]

## 2017-06-14 NOTE — ED Triage Notes (Signed)
Pt in by [facility] after tripping and falling last night, c/o L sided rib and L elbow pain. Recent elbow/side ribs fractures per EMS. Pt is staying at [facility] after the recent falls and fractures. Pt alert, VSS, no blood thinner use.

## 2017-06-14 NOTE — ED Notes (Signed)
Got patient undress on the monitor patient is resting with family at beside call bell in reach

## 2017-06-14 NOTE — ED Notes (Signed)
Patient transported to X-ray 

## 2017-06-14 NOTE — Discharge Instructions (Signed)
Follow up with the orthopedic doc in about a week.  Take your arm out of the sling at least a couple times a day.

## 2017-06-23 ENCOUNTER — Encounter (HOSPITAL_COMMUNITY): Payer: Self-pay | Admitting: Emergency Medicine

## 2017-06-23 ENCOUNTER — Emergency Department (HOSPITAL_COMMUNITY)
Admission: EM | Admit: 2017-06-23 | Discharge: 2017-06-23 | Disposition: A | Discharge status: Home / Self Care | Payer: Medicare Other | Attending: Emergency Medicine | Admitting: Emergency Medicine

## 2017-06-23 ENCOUNTER — Emergency Department (HOSPITAL_COMMUNITY): Payer: Medicare Other

## 2017-06-23 DIAGNOSIS — Z87891 Personal history of nicotine dependence: Secondary | ICD-10-CM | POA: Diagnosis not present

## 2017-06-23 DIAGNOSIS — Z85828 Personal history of other malignant neoplasm of skin: Secondary | ICD-10-CM | POA: Insufficient documentation

## 2017-06-23 DIAGNOSIS — F0390 Unspecified dementia without behavioral disturbance: Secondary | ICD-10-CM | POA: Diagnosis not present

## 2017-06-23 DIAGNOSIS — R55 Syncope and collapse: Secondary | ICD-10-CM | POA: Diagnosis present

## 2017-06-23 DIAGNOSIS — F419 Anxiety disorder, unspecified: Secondary | ICD-10-CM | POA: Insufficient documentation

## 2017-06-23 DIAGNOSIS — I10 Essential (primary) hypertension: Secondary | ICD-10-CM | POA: Insufficient documentation

## 2017-06-23 DIAGNOSIS — Z79899 Other long term (current) drug therapy: Secondary | ICD-10-CM | POA: Insufficient documentation

## 2017-06-23 DIAGNOSIS — I2510 Atherosclerotic heart disease of native coronary artery without angina pectoris: Secondary | ICD-10-CM | POA: Insufficient documentation

## 2017-06-23 DIAGNOSIS — R404 Transient alteration of awareness: Secondary | ICD-10-CM | POA: Insufficient documentation

## 2017-06-23 LAB — I-STAT CHEM 8, ED
BUN: 24 mg/dL — AB (ref 6–20)
CREATININE: 1.2 mg/dL (ref 0.61–1.24)
Calcium, Ion: 1.28 mmol/L (ref 1.15–1.40)
Chloride: 105 mmol/L (ref 101–111)
Glucose, Bld: 106 mg/dL — ABNORMAL HIGH (ref 65–99)
HCT: 23 % — ABNORMAL LOW (ref 39.0–52.0)
HEMOGLOBIN: 7.8 g/dL — AB (ref 13.0–17.0)
POTASSIUM: 4.1 mmol/L (ref 3.5–5.1)
SODIUM: 141 mmol/L (ref 135–145)
TCO2: 26 mmol/L (ref 0–100)

## 2017-06-23 NOTE — ED Notes (Signed)
Got patient on the monitor did ekg shown to Dr Vanita Panda patient is resting with call bell at reach and family at bedside

## 2017-06-23 NOTE — ED Notes (Signed)
Jessica RN bladder scanned pt and found 136mL in bladder

## 2017-06-23 NOTE — ED Provider Notes (Signed)
[facility] DEPT Provider Note   CSN: [id number] Arrival date & time: 06/23/17  1347     History   Chief Complaint Chief Complaint  Patient presents with  . Altered Mental Status    HPI DIANGELO RADEL is a 83 y.o. male.  HPI  Patient presents from his nursing facility after an episode of unresponsiveness. Try the patient is awake and alert, states that he wants to go home, denies complaints. However, per report the patient had an episode of prolonged unresponsiveness, during which he was either slumped over or leaning back, it is unknown. No report of fall, trauma. Initially discussed patient's case with EMS providers and the patient, but subsequently the patient's daughters arrive. They note that he has had progressive decline recently with worsening mental state, treated to advancing dementia, as well as more frequent falls than usual. Patient has recent ED evaluation for olecranon fracture has immobilization in place, and he personally denies any complaints of the hand or wrist distal to the fracture site. Outpatient orthopedic follow-up scheduled for later this week. EMS notes that on arrival the patient was awake and alert, interactive, had no complaints, and he was hemodynamically stable en route.  Family notes the patient had urinary tract infection recently, has had difficulty with urination, but has had no cough, no fever. Daughters also note the patient has had prior similar episodes to today's unresponsiveness, though possibly not as prolonged.  Past Medical History:  Diagnosis Date  . Arthritis   . Back pain 07/18/2014  . Basal cell carcinoma of face 07/20/2013   3 lesions excised Dr Link Snuffer approx  7/14  . C. difficile colitis   . CAD (coronary artery disease) 02/08/2009   LHC: dilated ectatic segment in the midportion of the LAD with mild to mod. narrowing just distal to this.  . Clostridium difficile infection   . GERD (gastroesophageal reflux disease)   .  High cholesterol   . Hx of lymphoma, non-Hodgkins 07/19/2013  . Hypertension   . MGUS (monoclonal gammopathy of unknown significance) 06/07/2012  . Non Hodgkin's lymphoma ([place]) dx'd 1997  . Paget's bone disease   . Panic    panic disorder  . Rhinitis 06/07/2012  . Skin cancer     Patient Active Problem List   Diagnosis Date Noted  . Hemopneumothorax on left 05/26/2017  . Multiple fractures of ribs, left side, initial encounter for closed fracture 05/26/2017  . Pancytopenia, acquired ([place]) 08/13/2016  . Weight loss 08/13/2016  . Chronic diarrhea 08/13/2016  . Acute lower GI bleeding 04/19/2016  . Dyslipidemia 05/10/2015  . Midline low back pain without sciatica 08/09/2014  . Sinusitis 08/09/2014  . Back pain 07/18/2014  . Thrombocytopenia, unspecified ([place]) 07/18/2014  . Palpitations 02/18/2014  . Basal cell carcinoma of face 07/20/2013  . Hx of lymphoma, non-Hodgkins 07/19/2013  . Acute blood loss anemia 12/15/2012  . C. difficile colitis 12/10/2012  . Dehydration 12/10/2012  . Orthostatic hypotension 12/10/2012  . Paget's bone disease 12/04/2012  . Colitis 12/04/2012  . LGI bleed 12/04/2012  . MGUS (monoclonal gammopathy of unknown significance) 06/07/2012  . Rhinitis 06/07/2012  . Rectal bleeding 11/21/2011  . HTN (hypertension) 11/21/2011  . GERD (gastroesophageal reflux disease) 11/21/2011  . Anxiety disorder 11/21/2011  . Arthritis 11/21/2011  . Anemia in neoplastic disease 11/21/2011    Past Surgical History:  Procedure Laterality Date  . BACK SURGERY    . CARDIAC CATHETERIZATION  02/08/2009   nonocclusive CAD  . COLONOSCOPY N/A 12/15/2012  Procedure: COLONOSCOPY;  Surgeon: Missy Sabins, MD;  Location: [facility];  Service: Endoscopy;  Laterality: N/A;  . HERNIA REPAIR    . NECK SURGERY     c2  . PROSTATECTOMY    . SKIN BIOPSY         Home Medications    Prior to Admission medications   Medication Sig Start Date End Date Taking? Authorizing Provider    acetaminophen (TYLENOL) 500 MG tablet Take 1 tablet (500 mg total) by mouth every 4 (four) hours as needed for mild pain, moderate pain, fever or headache. 06/02/17  Yes Simaan, Darci Current, PA-C  ALPRAZolam (XANAX) 0.5 MG tablet Take 1 tablet (0.5 mg total) by mouth 2 (two) times daily as needed. anxiety Patient taking differently: Take 0.5 mg by mouth at bedtime as needed for anxiety.  12/15/12  Yes Dellinger, Bobby Rumpf, PA-C  amLODipine (NORVASC) 10 MG tablet TAKE 0.5 TABLET (5 MG) BY MOUTH BID 02/07/17  Yes [provider]  bethanechol (URECHOLINE) 10 MG tablet Take 10 mg by mouth 3 (three) times daily.   Yes [provider]  carvedilol (COREG) 25 MG tablet TAKE 1 TABLET TWICE A DAY (NEED APPOINTMENT FOR FURTHER REFILLS, CALL [phone]) 07/22/16  Yes Hilty, Nadean Corwin, MD  cetirizine (ZYRTEC) 10 MG tablet Take 0.5 tablets (5 mg total) by mouth daily. 07/19/13  Yes Annia Belt, MD  cholecalciferol (VITAMIN D) 1000 UNITS tablet Take 1,000 Units by mouth daily.   Yes [provider]  enalapril (VASOTEC) 10 MG tablet Take 10 mg by mouth 2 (two) times daily.    Yes [provider]  finasteride (PROSCAR) 5 MG tablet Take 5 mg by mouth daily. 02/14/14  Yes [provider]  fluocinonide ointment (LIDEX) 6.94 % Apply 1 application topically 2 (two) times daily as needed (rash). rash    Yes [provider]  ipratropium (ATROVENT) 0.06 % nasal spray USE 2 SPRAYS IN EACH NOSTRIL THREE TIMES A DAY AS NEEDED FOR ALLERGIES 12/11/16  Yes [provider]  levofloxacin (LEVAQUIN) 500 MG tablet Take 500 mg by mouth daily. 06/19/17 06/25/17 Yes [provider]  lidocaine (LIDODERM) 5 % Place 1 patch onto the skin daily. Remove & Discard patch within 12 hours or as directed by MD 07/18/14  Yes Alvy Bimler, Ni, MD  LORazepam (ATIVAN IJ) Inject 0.5-1 mg as directed every 6 (six) hours as needed (agitation).   Yes [provider]   methocarbamol (ROBAXIN) 500 MG tablet Take 1.5 tablets (750 mg total) by mouth every 8 (eight) hours as needed for muscle spasms. 06/02/17  Yes Simaan, Darci Current, PA-C  polyethylene glycol (MIRALAX / GLYCOLAX) packet Take 17 g by mouth daily as needed for moderate constipation.    Yes [provider]  RAPAFLO 4 MG CAPS capsule Take 4 mg by mouth daily with breakfast.  04/02/17  Yes [provider]  sodium chloride 1 g tablet Take 1 tablet (1 g total) by mouth 3 (three) times daily with meals. 06/02/17  Yes Simaan, Darci Current, PA-C  traMADol (ULTRAM) 50 MG tablet Take 2 tablets (100 mg total) by mouth every 6 (six) hours as needed for moderate pain. 06/02/17  Yes Jill Alexanders, PA-C    Family History Family History  Problem Relation Age of Onset  . Hypertension Mother   . Heart failure Mother   . Hypertension Father   . Cancer Brother   . Heart failure Sister     Social History Social  History  Substance Use Topics  . Smoking status: Former Smoker    Quit date: 11/20/1968  . Smokeless tobacco: Never Used  . Alcohol use No     Allergies   Aspirin; Ciprofloxacin; Codeine; Iohexol; Macrodantin; and Morphine and related   Review of Systems Review of Systems  Unable to perform ROS: Dementia     Physical Exam Updated Vital Signs BP (!) 95/50 (BP Location: Right Arm)   Pulse 81   Temp 98.4 F (36.9 C) (Oral)   Resp 16   SpO2 93%   Physical Exam  Constitutional: He has a sickly appearance. No distress.  HENT:  Head: Normocephalic and atraumatic.  Eyes: Conjunctivae and EOM are normal.  Cardiovascular: Normal rate and regular rhythm.   Pulmonary/Chest: Effort normal. No stridor. No respiratory distress.  Abdominal: He exhibits no distension.  Musculoskeletal: He exhibits no edema.       Arms: Neurological: He is alert. He displays atrophy. He displays no tremor. He exhibits normal muscle tone. He displays no seizure activity.  Skin: Skin is warm  and dry.  Psychiatric: He has a normal mood and affect. Cognition and memory are impaired.  Patient is awake and alert, appropriately interactive, agreeable to evaluation for safety, but when daughters suggest he needs additional testing, the patient is recalcitrant, argumentative with them. Patient is oriented to self, roughly to place, time.  Nursing note and vitals reviewed.    ED Treatments / Results  Labs (all labs ordered are listed, but only abnormal results are displayed) Labs Reviewed  I-STAT CHEM 8, ED - Abnormal; Notable for the following:       Result Value   BUN 24 (*)    Glucose, Bld 106 (*)    Hemoglobin 7.8 (*)    HCT 23.0 (*)    All other components within normal limits    EKG  EKG Interpretation  Date/Time:  Monday June 23 2017 13:56:22 EDT Ventricular Rate:  82 PR Interval:    QRS Duration: 116 QT Interval:  431 QTC Calculation: 504 R Axis:   -45 Text Interpretation:  Sinus rhythm Left axis deviation Artifact T wave abnormality Abnormal ekg Confirmed by Carmin Muskrat [phone]) on 06/23/2017 2:30:42 PM       Radiology Dg Chest Port 1 View  Result Date: 06/23/2017 CLINICAL DATA:  Altered mental status.  History of hypertension . EXAM: PORTABLE CHEST 1 VIEW COMPARISON:  06/14/2017 . FINDINGS: Mediastinum and hilar structures normal. Borderline cardiomegaly, stable. No pulmonary venous congestion. No focal infiltrate. Old left rib fractures. Prior cervical spine fusion. IMPRESSION: 1. No acute cardiopulmonary disease. 2. Borderline cardiomegaly. Electronically Signed   By: Marcello Moores  Register   On: 06/23/2017 14:21    Procedures Procedures (including critical care time)    Initial Impression / Assessment and Plan / ED Course  I have reviewed the triage vital signs and the nursing notes.  Pertinent labs & imaging results that were available during my care of the patient were reviewed by me and considered in my medical decision making (see chart for  details).  Elderly male presents with daughters after episodic unresponsiveness that was not witnessed by them. Here the patient is awake, alert, denying complaints, denying pain. Evaluation here notable for unchanged x-ray, no neurologic deficiency, no abdominal pain, no evidence for urinary retention Patient is afebrile, awake, alert, low suspicion for occult infection. Etiology for his episode is unclear, though there is some suspicion for his anemia contributed to his episode. Family members are aware  of his anemia, and the patient has scheduled outpatient follow-up later this week.  Patient is adamant about not staying in the hospital for additional monitoring, and given his reassuring vital signs, scheduled outpatient follow-up, advanced age, this seems reasonable.   Final Clinical Impressions(s) / ED Diagnoses  Unresponsiveness   Carmin Muskrat, MD 06/23/17 1539

## 2017-06-23 NOTE — ED Notes (Signed)
[facility] called to transport pt 

## 2017-06-23 NOTE — Discharge Instructions (Signed)
As discussed, your evaluation today has been largely reassuring.  But, it is important that you monitor your condition carefully, and do not hesitate to return to the ED if you develop new, or concerning changes in your condition. ? ?Otherwise, please follow-up with your physician for appropriate ongoing care. ? ?

## 2017-06-23 NOTE — ED Triage Notes (Signed)
Per EMS: pt from [facility] with episode of unresponsiveness per facility; pt has had recent falls and has rib fx and left arm fx; pt demented and alert to person only per baseline; no obvious deficits at present

## 2017-06-27 ENCOUNTER — Inpatient Hospital Stay (HOSPITAL_COMMUNITY)
Admission: EM | Admit: 2017-06-27 | Discharge: 2017-06-29 | DRG: 871 | Disposition: A | Discharge status: Skilled Nursing Facility | Payer: Medicare Other | Attending: Internal Medicine | Admitting: Internal Medicine

## 2017-06-27 ENCOUNTER — Emergency Department (HOSPITAL_COMMUNITY): Payer: Medicare Other

## 2017-06-27 ENCOUNTER — Encounter (HOSPITAL_COMMUNITY): Payer: Self-pay

## 2017-06-27 DIAGNOSIS — S271XXA Traumatic hemothorax, initial encounter: Secondary | ICD-10-CM | POA: Diagnosis present

## 2017-06-27 DIAGNOSIS — I2510 Atherosclerotic heart disease of native coronary artery without angina pectoris: Secondary | ICD-10-CM | POA: Diagnosis present

## 2017-06-27 DIAGNOSIS — F410 Panic disorder [episodic paroxysmal anxiety] without agoraphobia: Secondary | ICD-10-CM | POA: Diagnosis present

## 2017-06-27 DIAGNOSIS — N390 Urinary tract infection, site not specified: Secondary | ICD-10-CM | POA: Diagnosis present

## 2017-06-27 DIAGNOSIS — R296 Repeated falls: Secondary | ICD-10-CM | POA: Diagnosis present

## 2017-06-27 DIAGNOSIS — Z8572 Personal history of non-Hodgkin lymphomas: Secondary | ICD-10-CM

## 2017-06-27 DIAGNOSIS — W19XXXA Unspecified fall, initial encounter: Secondary | ICD-10-CM | POA: Diagnosis present

## 2017-06-27 DIAGNOSIS — Z87891 Personal history of nicotine dependence: Secondary | ICD-10-CM

## 2017-06-27 DIAGNOSIS — M1990 Unspecified osteoarthritis, unspecified site: Secondary | ICD-10-CM | POA: Diagnosis present

## 2017-06-27 DIAGNOSIS — J189 Pneumonia, unspecified organism: Secondary | ICD-10-CM | POA: Diagnosis not present

## 2017-06-27 DIAGNOSIS — Z79899 Other long term (current) drug therapy: Secondary | ICD-10-CM | POA: Diagnosis not present

## 2017-06-27 DIAGNOSIS — N179 Acute kidney failure, unspecified: Secondary | ICD-10-CM | POA: Diagnosis not present

## 2017-06-27 DIAGNOSIS — Z515 Encounter for palliative care: Secondary | ICD-10-CM

## 2017-06-27 DIAGNOSIS — Z886 Allergy status to analgesic agent status: Secondary | ICD-10-CM | POA: Diagnosis not present

## 2017-06-27 DIAGNOSIS — J310 Chronic rhinitis: Secondary | ICD-10-CM | POA: Diagnosis present

## 2017-06-27 DIAGNOSIS — F03C0 Unspecified dementia, severe, without behavioral disturbance, psychotic disturbance, mood disturbance, and anxiety: Secondary | ICD-10-CM | POA: Diagnosis present

## 2017-06-27 DIAGNOSIS — Y92129 Unspecified place in nursing home as the place of occurrence of the external cause: Secondary | ICD-10-CM | POA: Diagnosis not present

## 2017-06-27 DIAGNOSIS — S2242XA Multiple fractures of ribs, left side, initial encounter for closed fracture: Secondary | ICD-10-CM | POA: Diagnosis present

## 2017-06-27 DIAGNOSIS — I10 Essential (primary) hypertension: Secondary | ICD-10-CM | POA: Diagnosis present

## 2017-06-27 DIAGNOSIS — F0390 Unspecified dementia without behavioral disturbance: Secondary | ICD-10-CM | POA: Diagnosis present

## 2017-06-27 DIAGNOSIS — R6521 Severe sepsis with septic shock: Secondary | ICD-10-CM | POA: Diagnosis not present

## 2017-06-27 DIAGNOSIS — N3000 Acute cystitis without hematuria: Secondary | ICD-10-CM | POA: Diagnosis not present

## 2017-06-27 DIAGNOSIS — Z66 Do not resuscitate: Secondary | ICD-10-CM | POA: Diagnosis present

## 2017-06-27 DIAGNOSIS — M889 Osteitis deformans of unspecified bone: Secondary | ICD-10-CM | POA: Diagnosis present

## 2017-06-27 DIAGNOSIS — J9601 Acute respiratory failure with hypoxia: Secondary | ICD-10-CM | POA: Diagnosis present

## 2017-06-27 DIAGNOSIS — S52022A Displaced fracture of olecranon process without intraarticular extension of left ulna, initial encounter for closed fracture: Secondary | ICD-10-CM | POA: Diagnosis present

## 2017-06-27 DIAGNOSIS — Z91041 Radiographic dye allergy status: Secondary | ICD-10-CM

## 2017-06-27 DIAGNOSIS — Z7982 Long term (current) use of aspirin: Secondary | ICD-10-CM | POA: Diagnosis not present

## 2017-06-27 DIAGNOSIS — A419 Sepsis, unspecified organism: Principal | ICD-10-CM | POA: Diagnosis present

## 2017-06-27 DIAGNOSIS — Z881 Allergy status to other antibiotic agents status: Secondary | ICD-10-CM | POA: Diagnosis not present

## 2017-06-27 DIAGNOSIS — Z885 Allergy status to narcotic agent status: Secondary | ICD-10-CM

## 2017-06-27 DIAGNOSIS — E46 Unspecified protein-calorie malnutrition: Secondary | ICD-10-CM | POA: Diagnosis present

## 2017-06-27 LAB — I-STAT ARTERIAL BLOOD GAS, ED
ACID-BASE DEFICIT: 2 mmol/L (ref 0.0–2.0)
Acid-base deficit: 2 mmol/L (ref 0.0–2.0)
BICARBONATE: 22.4 mmol/L (ref 20.0–28.0)
BICARBONATE: 22.8 mmol/L (ref 20.0–28.0)
O2 SAT: 78 %
O2 SAT: 96 %
TCO2: 23 mmol/L (ref 22–32)
TCO2: 24 mmol/L (ref 22–32)
pCO2 arterial: 35.8 mmHg (ref 32.0–48.0)
pCO2 arterial: 36.3 mmHg (ref 32.0–48.0)
pH, Arterial: 7.401 (ref 7.350–7.450)
pH, Arterial: 7.414 (ref 7.350–7.450)
pO2, Arterial: 43 mmHg — ABNORMAL LOW (ref 83.0–108.0)
pO2, Arterial: 82 mmHg — ABNORMAL LOW (ref 83.0–108.0)

## 2017-06-27 LAB — CBC WITH DIFFERENTIAL/PLATELET
Basophils Absolute: 0 10*3/uL (ref 0.0–0.1)
Basophils Relative: 0 %
EOS PCT: 0 %
Eosinophils Absolute: 0 10*3/uL (ref 0.0–0.7)
HCT: 26.5 % — ABNORMAL LOW (ref 39.0–52.0)
Hemoglobin: 8.6 g/dL — ABNORMAL LOW (ref 13.0–17.0)
LYMPHS ABS: 0.7 10*3/uL (ref 0.7–4.0)
LYMPHS PCT: 5 %
MCH: 31.6 pg (ref 26.0–34.0)
MCHC: 32.5 g/dL (ref 30.0–36.0)
MCV: 97.4 fL (ref 78.0–100.0)
MONO ABS: 0.5 10*3/uL (ref 0.1–1.0)
Monocytes Relative: 4 %
Neutro Abs: 11.9 10*3/uL — ABNORMAL HIGH (ref 1.7–7.7)
Neutrophils Relative %: 91 %
PLATELETS: 241 10*3/uL (ref 150–400)
RBC: 2.72 MIL/uL — AB (ref 4.22–5.81)
RDW: 14.9 % (ref 11.5–15.5)
WBC: 13.1 10*3/uL — ABNORMAL HIGH (ref 4.0–10.5)

## 2017-06-27 LAB — COMPREHENSIVE METABOLIC PANEL
ALBUMIN: 2.7 g/dL — AB (ref 3.5–5.0)
ALK PHOS: 54 U/L (ref 38–126)
ALT: 15 U/L — ABNORMAL LOW (ref 17–63)
ANION GAP: 11 (ref 5–15)
AST: 24 U/L (ref 15–41)
BUN: 45 mg/dL — ABNORMAL HIGH (ref 6–20)
CHLORIDE: 116 mmol/L — AB (ref 101–111)
CO2: 21 mmol/L — AB (ref 22–32)
Calcium: 9.1 mg/dL (ref 8.9–10.3)
Creatinine, Ser: 2.54 mg/dL — ABNORMAL HIGH (ref 0.61–1.24)
GFR calc non Af Amer: 20 mL/min — ABNORMAL LOW (ref >60)
GFR, EST AFRICAN AMERICAN: 24 mL/min — AB (ref >60)
GLUCOSE: 150 mg/dL — AB (ref 65–99)
Potassium: 4.7 mmol/L (ref 3.5–5.1)
SODIUM: 148 mmol/L — AB (ref 135–145)
Total Bilirubin: 1.3 mg/dL — ABNORMAL HIGH (ref 0.3–1.2)
Total Protein: 6.1 g/dL — ABNORMAL LOW (ref 6.5–8.1)

## 2017-06-27 LAB — I-STAT CG4 LACTIC ACID, ED: Lactic Acid, Venous: 2.87 mmol/L (ref 0.5–1.9)

## 2017-06-27 LAB — URINALYSIS, ROUTINE W REFLEX MICROSCOPIC
BILIRUBIN URINE: NEGATIVE
Glucose, UA: 50 mg/dL — AB
Ketones, ur: NEGATIVE mg/dL
Nitrite: NEGATIVE
PROTEIN: 100 mg/dL — AB
SPECIFIC GRAVITY, URINE: 1.017 (ref 1.005–1.030)
pH: 5 (ref 5.0–8.0)

## 2017-06-27 LAB — BRAIN NATRIURETIC PEPTIDE: B NATRIURETIC PEPTIDE 5: 51.6 pg/mL (ref 0.0–100.0)

## 2017-06-27 LAB — I-STAT TROPONIN, ED: TROPONIN I, POC: 0.02 ng/mL (ref 0.00–0.08)

## 2017-06-27 MED ORDER — ONDANSETRON HCL 4 MG/2ML IJ SOLN: 4.0000 mg | Freq: Four times a day (QID) | INTRAMUSCULAR | Status: DC | PRN

## 2017-06-27 MED ORDER — VANCOMYCIN HCL IN DEXTROSE 1-5 GM/200ML-% IV SOLN
1000.0000 mg | Freq: Once | INTRAVENOUS | Status: AC
  Administered 2017-06-27: 1000 mg via INTRAVENOUS
  Filled 2017-06-27: qty 200

## 2017-06-27 MED ORDER — SODIUM CHLORIDE 0.9 % IV BOLUS (SEPSIS)
1000.0000 mL | Freq: Once | INTRAVENOUS | Status: AC
  Administered 2017-06-27: 1000 mL via INTRAVENOUS

## 2017-06-27 MED ORDER — FENTANYL BOLUS VIA INFUSION: 10.0000 ug | INTRAVENOUS | Status: DC | PRN

## 2017-06-27 MED ORDER — ONDANSETRON HCL 4 MG PO TABS: 4.0000 mg | ORAL_TABLET | Freq: Four times a day (QID) | ORAL | Status: DC | PRN

## 2017-06-27 MED ORDER — SODIUM CHLORIDE 0.9 % IV BOLUS (SEPSIS): 1000.0000 mL | Freq: Once | INTRAVENOUS | Status: DC

## 2017-06-27 MED ORDER — FENTANYL 2500MCG IN NS 250ML (10MCG/ML) PREMIX INFUSION
10.0000 ug/h | INTRAVENOUS | Status: DC
  Administered 2017-06-27: 10 ug/h via INTRAVENOUS
  Filled 2017-06-27: qty 250

## 2017-06-27 MED ORDER — PIPERACILLIN-TAZOBACTAM 3.375 G IVPB 30 MIN
3.3750 g | Freq: Once | INTRAVENOUS | Status: AC
  Administered 2017-06-27: 3.375 g via INTRAVENOUS
  Filled 2017-06-27: qty 50

## 2017-06-27 MED ORDER — ACETAMINOPHEN 325 MG PO TABS: 650.0000 mg | ORAL_TABLET | Freq: Four times a day (QID) | ORAL | Status: DC | PRN

## 2017-06-27 MED ORDER — ACETAMINOPHEN 650 MG RE SUPP: 650.0000 mg | Freq: Four times a day (QID) | RECTAL | Status: DC | PRN

## 2017-06-27 MED ORDER — FENTANYL BOLUS VIA INFUSION
10.0000 ug | INTRAVENOUS | Status: DC | PRN
  Administered 2017-06-28 – 2017-06-29 (×2): 10 ug via INTRAVENOUS
  Administered 2017-06-29: 15 ug via INTRAVENOUS
  Administered 2017-06-29: 10 ug via INTRAVENOUS
  Administered 2017-06-29 (×2): 15 ug via INTRAVENOUS
  Filled 2017-06-27: qty 25

## 2017-06-27 NOTE — Clinical Social Work Note (Signed)
CSW made referral to [facility]. [place], [place]

## 2017-06-27 NOTE — Progress Notes (Addendum)
[facility] Liaison:  RN visit  Received request from [place], [place], for family interest in [facility].  Chart being reviewed and spoke with family to acknowledge referral.  Unfortunately, [facility] is not able to offer a room today.  Family and CSW are aware [facility] liaison will follow up with CSW and family tomorrow or sooner if room becomes available.  Please do not hesitate to call with questions.   Thank you for the referral.  Edyth Gunnels, RN, BSN [facility] Liaison [phone]  All hospital liaisons are now on [place].

## 2017-06-27 NOTE — ED Triage Notes (Signed)
Pt comes via [facility]  EMS from [facility] for resp distress. Today had a decline in mental status and O2 stats. Responds to voice. Initially 70% on [place], placed on non-rebreather, pt has DNR.

## 2017-06-27 NOTE — ED Provider Notes (Addendum)
TIME SEEN: 1:55 AM  CHIEF COMPLAINT: respiratory distress  HPI: Patient is a 83 year old male with history of CAD, hyperlipidemia, hypertension, non-Hodgkin's lymphoma who presents the emergency department from [place] with complaints of respiratory distress, hypoxia. Per EMS, patient was found to be hypoxic and oxygen saturation in the 70s on nasal cannula. Oxygen saturation only improved into the 80s on nonrebreather. He was altered with agonal respirations and they began to bag him with BVM in route.  Patient has had recent complex medical course. On July 23 he was admitted for a fall with left-sided rib fractures and hemopneumothorax. He then fell again and had a left olecranon fracture and was placed in a splint. He developed urinary retention and has a Foley catheter in place. He has a urinary tract infection and is currently on Levaquin.     ROS: Level V caveat for altered mental status, respiratory distress  PAST MEDICAL HISTORY/PAST SURGICAL HISTORY:  Past Medical History:  Diagnosis Date  . Arthritis   . Back pain 07/18/2014  . Basal cell carcinoma of face 07/20/2013   3 lesions excised Dr Link Snuffer approx  7/14  . C. difficile colitis   . CAD (coronary artery disease) 02/08/2009   LHC: dilated ectatic segment in the midportion of the LAD with mild to mod. narrowing just distal to this.  . Clostridium difficile infection   . GERD (gastroesophageal reflux disease)   . High cholesterol   . Hx of lymphoma, non-Hodgkins 07/19/2013  . Hypertension   . MGUS (monoclonal gammopathy of unknown significance) 06/07/2012  . Non Hodgkin's lymphoma ([place]) dx'd 1997  . Paget's bone disease   . Panic    panic disorder  . Rhinitis 06/07/2012  . Skin cancer     MEDICATIONS:  Prior to Admission medications   Medication Sig Start Date End Date Taking? Authorizing Provider  acetaminophen (TYLENOL) 500 MG tablet Take 1 tablet (500 mg total) by mouth every 4 (four) hours as  needed for mild pain, moderate pain, fever or headache. 06/02/17   Jill Alexanders, PA-C  ALPRAZolam Duanne Moron) 0.5 MG tablet Take 1 tablet (0.5 mg total) by mouth 2 (two) times daily as needed. anxiety Patient taking differently: Take 0.5 mg by mouth at bedtime as needed for anxiety.  12/15/12   Dellinger, Bobby Rumpf, PA-C  amLODipine (NORVASC) 10 MG tablet TAKE 0.5 TABLET (5 MG) BY MOUTH BID 02/07/17   [provider]  bethanechol (URECHOLINE) 10 MG tablet Take 10 mg by mouth 3 (three) times daily.    [provider]  carvedilol (COREG) 25 MG tablet TAKE 1 TABLET TWICE A DAY (NEED APPOINTMENT FOR FURTHER REFILLS, CALL [phone]) 07/22/16   Hilty, Nadean Corwin, MD  cetirizine (ZYRTEC) 10 MG tablet Take 0.5 tablets (5 mg total) by mouth daily. 07/19/13   Annia Belt, MD  cholecalciferol (VITAMIN D) 1000 UNITS tablet Take 1,000 Units by mouth daily.    [provider]  enalapril (VASOTEC) 10 MG tablet Take 10 mg by mouth 2 (two) times daily.     [provider]  finasteride (PROSCAR) 5 MG tablet Take 5 mg by mouth daily. 02/14/14   [provider]  fluocinonide ointment (LIDEX) 5.18 % Apply 1 application topically 2 (two) times daily as needed (rash). rash     [provider]  ipratropium (ATROVENT) 0.06 % nasal spray USE 2 SPRAYS IN EACH NOSTRIL THREE TIMES A DAY AS NEEDED FOR ALLERGIES 12/11/16   [provider]  lidocaine (  LIDODERM) 5 % Place 1 patch onto the skin daily. Remove & Discard patch within 12 hours or as directed by MD 07/18/14   Heath Lark, MD  LORazepam (ATIVAN IJ) Inject 0.5-1 mg as directed every 6 (six) hours as needed (agitation).    [provider]  methocarbamol (ROBAXIN) 500 MG tablet Take 1.5 tablets (750 mg total) by mouth every 8 (eight) hours as needed for muscle spasms. 06/02/17   Jill Alexanders, PA-C  polyethylene glycol (MIRALAX / GLYCOLAX) packet Take 17 g by mouth daily as needed for moderate  constipation.     [provider]  RAPAFLO 4 MG CAPS capsule Take 4 mg by mouth daily with breakfast.  04/02/17   [provider]  sodium chloride 1 g tablet Take 1 tablet (1 g total) by mouth 3 (three) times daily with meals. 06/02/17   Jill Alexanders, PA-C  traMADol (ULTRAM) 50 MG tablet Take 2 tablets (100 mg total) by mouth every 6 (six) hours as needed for moderate pain. 06/02/17   Jill Alexanders, PA-C    ALLERGIES:  Allergies  Allergen Reactions  . Aspirin     GI bleeding  . Ciprofloxacin Other (See Comments)    Caused GI bleed  . Codeine Other (See Comments)    Patient cannot recall reaction  . Iohexol      Desc: HAS SEIZURES WITH X-RAY DYE-GIVEN 120 MG SOLU0MEDROL, 25 MG BENADRYL, AND 25 MG PEPCID 1 HOUR PRIOR TO EXAM AND HAD NO PROBLEMS-ARS-08/15/07   . Macrodantin Other (See Comments)    Patient cannot recall reaction  . Morphine And Related Itching    SOCIAL HISTORY:  Social History  Substance Use Topics  . Smoking status: Former Smoker    Quit date: 11/20/1968  . Smokeless tobacco: Never Used  . Alcohol use No    FAMILY HISTORY: Family History  Problem Relation Age of Onset  . Hypertension Mother   . Heart failure Mother   . Hypertension Father   . Cancer Brother   . Heart failure Sister     EXAM: Pulse 82   Temp 99.4 F (37.4 C) (Rectal)   Resp 17   SpO2 100%  CONSTITUTIONAL: Alert, oriented to person, patient is cachectic and elderly HEAD: Normocephalic EYES: Conjunctivae clear, pupils appear equal, EOMI ENT: normal nose; moist mucous membranes NECK: Supple, no meningismus, no nuchal rigidity, no LAD  CARD: RRR; S1 and S2 appreciated; no murmurs, no clicks, no rubs, no gallops RESP: patient is in mild respiratory distress, diffuse rhonchorous breath sounds, wet cough, no wheezing or rales, patient is not hypoxic on a nonrebreather ABD/GI: Normal bowel sounds; non-distended; soft, non-tender, no rebound, no guarding, no  peritoneal signs, no hepatosplenomegaly, Foley catheter in place with dark yellow urine BACK:  The back appears normal and is non-tender to palpation, there is no CVA tenderness EXT: patient's left upper extremity is in a splint and sling.Normal ROM in all joints; non-tender to palpation; no edema; normal capillary refill; no cyanosis, no calf tenderness or swelling    SKIN: Normal color for age and race; warm; no rash NEURO: Moves all extremities equally, does not follow commands, does answer some questions PSYCH: The patient's mood and manner are appropriate. Grooming and personal hygiene are appropriate.  MEDICAL DECISION MAKING: Patient here with respiratory distress. Likely pneumonia. He feels very warm to touch, rectal temperature 99.4. I did discuss patient's care with his daughter Nolon Lennert.  She confirms that patient is a DO  NOT RESUSCITATE/DO NOT INTUBATE. He would want antibiotics, IV fluids and admission to the hospital.  Clinically he seems to be improving with a nonrebreather. His blood gas is reassuring.  ED PROGRESS: Patient's labs show leukocytosis with left shift. He has chronic anemia which appear stable. Chest x-ray shows left lower lobe pneumonia versus atelectasis. Clinically I think this is pneumonia. He is receiving IV fluids and broad-spectrum antibiotics.   2:44 AM  Pt's initial blood pressures were reading very elevated. We checked a manual blood pressure and is in the 64W systolic. We will place his fluids on pressure bags. He is receiving broad-spectrum antibiotics.  Will order a third liter of fluid.   4:00 AM  Pt's blood pressure is improving with IV hydration. Last blood pressure is 110/89.  We'll discuss with medicine for admission to step down.   4:51 AM Discussed patient's case with hospitalist, Dr. Alcario Drought.  I have recommended admission and patient (and family if present) agree with this plan. Admitting physician will place admission orders.   I reviewed all  nursing notes, vitals, pertinent previous records, EKGs, lab and urine results, imaging (as available).  Patient's daughters at bedside understand that this is likely a life-threatening event. Patient's blood pressure continues to decline. They do not want vasopressors or central line. They confirm again that he is DO NOT RESUSCITATE/DO NOT INTUBATE. They agree with continuing IV hydration and IV antibiotics. Hospitalist at bedside to discuss possibility of comfort care.   5:10 AM  Pt's family has decided to make patient comfort care. We have requested chaplain at the bedside per family request.   EKG Interpretation  Date/Time:  Friday June 27 2017 01:47:59 EDT Ventricular Rate:  82 PR Interval:    QRS Duration: 108 QT Interval:  437 QTC Calculation: 511 R Axis:   -40 Text Interpretation:  Sinus rhythm Left axis deviation Borderline T abnormalities, lateral leads Prolonged QT interval Confirmed by Pryor Curia [phone]) on 06/27/2017 1:55:50 AM        CRITICAL CARE Performed by: Nyra Jabs   Total critical care time: 65 minutes  Critical care time was exclusive of separately billable procedures and treating other patients.  Critical care was necessary to treat or prevent imminent or life-threatening deterioration.  Critical care was time spent personally by me on the following activities: development of treatment plan with patient and/or surrogate as well as nursing, discussions with consultants, evaluation of patient's response to treatment, examination of patient, obtaining history from patient or surrogate, ordering and performing treatments and interventions, ordering and review of laboratory studies, ordering and review of radiographic studies, pulse oximetry and re-evaluation of patient's condition.    Ward, Delice Bison, DO 06/27/17 Cleone, Delice Bison, DO 06/27/17 [phone]

## 2017-06-27 NOTE — Progress Notes (Addendum)
[facility]  I have examined the patient and reviewed the chart. He is resting and not awake. Continue comfort care with Fentanyl infusion. Have asked for a social work consult to assist in finding a [facility] bed. Have met with daughters. They are appreciative of our care.   Debbe Odea, MD

## 2017-06-27 NOTE — H&P (Signed)
History and Physical    Frank Kramer XBW:[id number] DOB: [birth year] DOA: 06/27/2017  PCP: Lujean Amel, MD  Patient coming from: SNF  I have personally briefly reviewed patient's old medical records in [place]  Chief Complaint: Respiratory distress  HPI: Frank Kramer is a 83 y.o. male with medical history significant of MGUS, CAD, HLD, NHL.  Patient also has fairly advanced dementia per family which is severe at baseline.  He presents to the ED with respiratory distress, satting 80% on RA, improved on NRB.  Patient unable to provide any history.   ED Course: Patient Septic with Tm 99.4, WBC 13.1k, lactate of 2.8.  Has AKI with creat 2.5 up from 1.2 just 4 days ago.  Hypotension into the 16L systolic.  Initially rallies and becomes more alert (able to say name and that's it) after fluid, but BP keeps dropping even after 84TX/MI systolic is now 76.  Per family and as far as I can tell from our hospital records, patients quality of life at baseline hasnt been good at all recently.  Basically as far as I can tell, he stays at the NH, occasionally stands up only to fall and break a bone.  Has broken multiple ribs requiring chest tube a month ago, and then broke elbow 2 weeks ago.   Review of Systems: Unable to perform due to AMS  Past Medical History:  Diagnosis Date  . Arthritis   . Back pain 07/18/2014  . Basal cell carcinoma of face 07/20/2013   3 lesions excised Dr Link Snuffer approx  7/14  . C. difficile colitis   . CAD (coronary artery disease) 02/08/2009   LHC: dilated ectatic segment in the midportion of the LAD with mild to mod. narrowing just distal to this.  . Clostridium difficile infection   . GERD (gastroesophageal reflux disease)   . High cholesterol   . Hx of lymphoma, non-Hodgkins 07/19/2013  . Hypertension   . MGUS (monoclonal gammopathy of unknown significance) 06/07/2012  . Non Hodgkin's lymphoma ([place]) dx'd 1997  . Paget's bone disease   . Panic    panic  disorder  . Rhinitis 06/07/2012  . Skin cancer     Past Surgical History:  Procedure Laterality Date  . BACK SURGERY    . CARDIAC CATHETERIZATION  02/08/2009   nonocclusive CAD  . COLONOSCOPY N/A 12/15/2012   Procedure: COLONOSCOPY;  Surgeon: Missy Sabins, MD;  Location: [facility];  Service: Endoscopy;  Laterality: N/A;  . HERNIA REPAIR    . NECK SURGERY     c2  . PROSTATECTOMY    . SKIN BIOPSY       reports that he quit smoking about 48 years ago. He has never used smokeless tobacco. He reports that he does not drink alcohol or use drugs.  Allergies  Allergen Reactions  . Aspirin     GI bleeding  . Ciprofloxacin Other (See Comments)    Caused GI bleed  . Codeine Other (See Comments)    Patient cannot recall reaction  . Iohexol      Desc: HAS SEIZURES WITH X-RAY DYE-GIVEN 120 MG SOLU0MEDROL, 25 MG BENADRYL, AND 25 MG PEPCID 1 HOUR PRIOR TO EXAM AND HAD NO PROBLEMS-ARS-08/15/07   . Macrodantin Other (See Comments)    Patient cannot recall reaction  . Morphine And Related Itching    Family History  Problem Relation Age of Onset  . Hypertension Mother   . Heart failure Mother   . Hypertension Father   .  Cancer Brother   . Heart failure Sister      Prior to Admission medications   Medication Sig Start Date End Date Taking? Authorizing Provider  acetaminophen (TYLENOL) 500 MG tablet Take 1 tablet (500 mg total) by mouth every 4 (four) hours as needed for mild pain, moderate pain, fever or headache. 06/02/17  Yes Simaan, Darci Current, PA-C  ALPRAZolam (XANAX) 0.5 MG tablet Take 1 tablet (0.5 mg total) by mouth 2 (two) times daily as needed. anxiety Patient taking differently: Take 0.5 mg by mouth 2 (two) times daily as needed for anxiety.  12/15/12  Yes Dellinger, Bobby Rumpf, PA-C  amLODipine (NORVASC) 10 MG tablet Take 10 mg by mouth daily.   Yes [provider]  aspirin EC 325 MG tablet Take 325 mg by mouth daily.   Yes [provider]  bethanechol  (URECHOLINE) 10 MG tablet Take 10 mg by mouth 3 (three) times daily.   Yes [provider]  carvedilol (COREG) 25 MG tablet TAKE 1 TABLET TWICE A DAY (NEED APPOINTMENT FOR FURTHER REFILLS, CALL [phone]) Patient taking differently: TAKE 25 MG ORAL TWICE A DAY 07/22/16  Yes Hilty, Nadean Corwin, MD  cetirizine (ZYRTEC) 10 MG tablet Take 0.5 tablets (5 mg total) by mouth daily. Patient taking differently: Take 10 mg by mouth daily.  07/19/13  Yes Annia Belt, MD  cholecalciferol (VITAMIN D) 1000 UNITS tablet Take 1,000 Units by mouth daily.   Yes [provider]  enalapril (VASOTEC) 10 MG tablet Take 10 mg by mouth 2 (two) times daily.    Yes [provider]  finasteride (PROSCAR) 5 MG tablet Take 5 mg by mouth daily. 02/14/14  Yes [provider]  fluocinonide cream (LIDEX) 2.95 % Apply 1 application topically 2 (two) times daily as needed (for rash).   Yes [provider]  ipratropium (ATROVENT) 0.06 % nasal spray USE 2 SPRAYS IN EACH NOSTRIL THREE TIMES A DAY AS NEEDED FOR ALLERGIES 12/11/16  Yes [provider]  lidocaine (LIDODERM) 5 % Place 1 patch onto the skin daily. Remove & Discard patch within 12 hours or as directed by MD Patient taking differently: Place 1 patch onto the skin daily.  07/18/14  Yes Gorsuch, Ni, MD  LORazepam (ATIVAN) 2 MG/ML injection Inject 0.5-1 mg into the vein every 6 (six) hours as needed for anxiety or sedation.   Yes [provider]  methocarbamol (ROBAXIN) 500 MG tablet Take 1.5 tablets (750 mg total) by mouth every 8 (eight) hours as needed for muscle spasms. 06/02/17  Yes Simaan, Darci Current, PA-C  polyethylene glycol (MIRALAX / GLYCOLAX) packet Take 17 g by mouth daily as needed for moderate constipation.    Yes [provider]  RAPAFLO 4 MG CAPS capsule Take 4 mg by mouth daily with breakfast.  04/02/17  Yes [provider]  sodium chloride 1 g tablet Take 1 tablet (1 g total) by  mouth 3 (three) times daily with meals. 06/02/17  Yes Simaan, Darci Current, PA-C  traMADol (ULTRAM) 50 MG tablet Take 2 tablets (100 mg total) by mouth every 6 (six) hours as needed for moderate pain. 06/02/17  Yes Simaan, Darci Current, PA-C  UNABLE TO FIND Take 120 mLs by mouth 2 (two) times daily. Med Name: MedPASS   Yes [provider]    Physical Exam: Vitals:   06/27/17 0415 06/27/17 0430 06/27/17 0445 06/27/17 0500  BP: (!) 89/44 (!) 86/62 (!) 89/44 (!) 82/52  Pulse: 82 82 81  81  Resp: 17 17 16 15   Temp:      TempSrc:      SpO2: 100% 100% 100% 100%    Constitutional: NAD, calm, comfortable Eyes: PERRL, lids and conjunctivae normal ENMT: Mucous membranes are moist. Posterior pharynx clear of any exudate or lesions.Normal dentition.  Neck: normal, supple, no masses, no thyromegaly Respiratory: Diffuse rhonchi, on NRB Cardiovascular: Regular rate and rhythm, no murmurs / rubs / gallops. No extremity edema. 2+ pedal pulses. No carotid bruits.  Abdomen: no tenderness, no masses palpated. No hepatosplenomegaly. Bowel sounds positive.  Musculoskeletal: no clubbing / cyanosis. No joint deformity upper and lower extremities. Good ROM, no contractures. Normal muscle tone.  Skin: no rashes, lesions, ulcers. No induration Neurologic: MAE, doesn't follow commands. Psychiatric: Oriented to person   Labs on Admission: I have personally reviewed following labs and imaging studies  CBC:  Recent Labs Lab 06/23/17 1513 06/27/17 0145  WBC  --  13.1*  NEUTROABS  --  11.9*  HGB 7.8* 8.6*  HCT 23.0* 26.5*  MCV  --  97.4  PLT  --  314   Basic Metabolic Panel:  Recent Labs Lab 06/23/17 1513 06/27/17 0145  NA 141 148*  K 4.1 4.7  CL 105 116*  CO2  --  21*  GLUCOSE 106* 150*  BUN 24* 45*  CREATININE 1.20 2.54*  CALCIUM  --  9.1   GFR: Estimated Creatinine Clearance: 16.1 mL/min (A) (by C-G formula based on SCr of 2.54 mg/dL (H)). Liver Function Tests:  Recent Labs Lab  06/27/17 0145  AST 24  ALT 15*  ALKPHOS 54  BILITOT 1.3*  PROT 6.1*  ALBUMIN 2.7*   No results for input(s): LIPASE, AMYLASE in the last 168 hours. No results for input(s): AMMONIA in the last 168 hours. Coagulation Profile: No results for input(s): INR, PROTIME in the last 168 hours. Cardiac Enzymes: No results for input(s): CKTOTAL, CKMB, CKMBINDEX, TROPONINI in the last 168 hours. BNP (last 3 results) No results for input(s): PROBNP in the last 8760 hours. HbA1C: No results for input(s): HGBA1C in the last 72 hours. CBG: No results for input(s): GLUCAP in the last 168 hours. Lipid Profile: No results for input(s): CHOL, HDL, LDLCALC, TRIG, CHOLHDL, LDLDIRECT in the last 72 hours. Thyroid Function Tests: No results for input(s): TSH, T4TOTAL, FREET4, T3FREE, THYROIDAB in the last 72 hours. Anemia Panel: No results for input(s): VITAMINB12, FOLATE, FERRITIN, TIBC, IRON, RETICCTPCT in the last 72 hours. Urine analysis:    Component Value Date/Time   COLORURINE AMBER (A) 06/27/2017 0155   APPEARANCEUR CLOUDY (A) 06/27/2017 0155   LABSPEC 1.017 06/27/2017 0155   PHURINE 5.0 06/27/2017 0155   GLUCOSEU 50 (A) 06/27/2017 0155   HGBUR MODERATE (A) 06/27/2017 0155   BILIRUBINUR NEGATIVE 06/27/2017 0155   KETONESUR NEGATIVE 06/27/2017 0155   PROTEINUR 100 (A) 06/27/2017 0155   UROBILINOGEN 0.2 11/23/2011 0430   NITRITE NEGATIVE 06/27/2017 0155   LEUKOCYTESUR MODERATE (A) 06/27/2017 0155    Radiological Exams on Admission: Dg Chest Port 1 View  Result Date: 06/27/2017 CLINICAL DATA:  Respiratory distress, cough, congestion EXAM: PORTABLE CHEST 1 VIEW COMPARISON:  06/23/2017 FINDINGS: Mild left lower lung opacity, likely scarring/ atelectasis. No pleural effusion or pneumothorax. The heart is normal in size. Multiple left rib fractures, unchanged. IMPRESSION: Multiple left rib fractures, unchanged.  No pneumothorax is seen. Mild left lower lung opacity, likely scarring/  atelectasis. Electronically Signed   By: Julian Hy M.D.   On: 06/27/2017 02:21  EKG: Independently reviewed.  Assessment/Plan Principal Problem:   Admission for end of life care Active Problems:   Severe sepsis with septic shock ([place])   Acute lower UTI   Acute respiratory failure with hypoxia ([facility])   Acute kidney injury ([place])   Advanced dementia    1. Admission for end of life care - 1. Family requests comfort measures only at this point, no more ABx, fluids, nor needle sticks. 2. I agree that this seems reasonable given the patients very poor baseline QoL. 3. Additionally despite 88ml/kg IVF resuscitation attempt, his BP has trended back down to 76 systolic.  Family does not want to escalate to vasopressors, and his kidneys are in AKI and not tolerating the low BPs. 4. As such will make him comfort measures only 5. No ABx, no IVF 6. Continue O2 for the moment 7. Add fentanyl gtt for air hunger and comfort, bolus as needed 8. Palliative care consult in AM 9. Ideally family expressed interest in [facility] if possible. 10. Although they understand that in-hospital death may also be possible given how ill he is, especially if BP continues to downtrend.  DVT prophylaxis: None - comfort measures Code Status: Comfort measures only Family Communication: Family including POA present Disposition Plan: Hospice Consults called: None Admission status: Admit to inpatient - inpatient status for continuous IV infusion of narcotics and end of life care.   Frank Quill DO [facility] Pager [phone]  If 7AM-7PM, please contact day team taking care of patient [email or web address] Password [email or web address]  06/27/2017, 5:22 AM

## 2017-06-27 NOTE — Progress Notes (Signed)
Fentanyl drip increased to 22ml/hr for comfort

## 2017-06-28 LAB — URINE CULTURE: Culture: NO GROWTH

## 2017-06-28 NOTE — Clinical Social Work Note (Addendum)
[employer] staff having their morning meeting. Hospital liaison will call CSW with determination regarding a bed for patient today.  Frank Kramer, [place] [phone]  2:44 pm Still no bed at [facility].  Frank Kramer, [place]

## 2017-06-28 NOTE — Progress Notes (Signed)
[facility]  Patient is alert and talkative today. Quite confused. No other complaints. Will place a diet order for him.  With ongoing poor quality of life, malnourished state, 2 other recent ER visits for multiple falls and fractures, may still qualify for [facility] place.   Debbe Odea, MD

## 2017-06-29 NOTE — Discharge Summary (Signed)
Physician Discharge Summary  Frank Kramer:[id number] DOB: 1921/12/14 DOA: 06/27/2017  PCP: Lujean Amel, MD  Admit date: 06/27/2017 Discharge date: 06/29/2017  Admitted From: SNF Disposition:   Hospice home   Recommendations for Outpatient Follow-up:  Pain control of fractures  Discharge Condition:  stable   CODE STATUS:  DNR   Diet recommendation:  Regular diet as tolearated  Discharge Diagnoses:  Principal Problem:   Admission for end of life care Active Problems:   Severe sepsis with septic shock ([place])   Acute lower UTI   Acute respiratory failure with hypoxia ([place])   Acute kidney injury ([place])   Advanced dementia    Subjective: No complaints. Confused.   Brief Summary: Frank Kramer is a 83 y.o. male with medical history significant of MGUS, CAD, HLD, NHL.  Patient also has fairly advanced dementia per family which is severe at baseline.  He presents to the ED with respiratory distress,Pox 80% on RA, improved on NRB.  Hospital Course:  Advanced dementia, malourished with ongoing poor oral intake, severely debilitated with frequent falls and recent left olecranon  fracture and fracture of ribs left ribs - family requesting hospice home- he has a bed today and will be transferred  Discharge Instructions  Discharge Instructions    Increase activity slowly    Complete by:  As directed      Allergies as of 06/29/2017      Reactions   Aspirin    GI bleeding   Ciprofloxacin Other (See Comments)   Caused GI bleed   Codeine Other (See Comments)   Patient cannot recall reaction   Iohexol     Desc: HAS SEIZURES WITH X-RAY DYE-GIVEN 120 MG SOLU0MEDROL, 25 MG BENADRYL, AND 25 MG PEPCID 1 HOUR PRIOR TO EXAM AND HAD NO PROBLEMS-ARS-08/15/07   Macrodantin Other (See Comments)   Patient cannot recall reaction   Morphine And Related Itching      Medication List    STOP taking these medications   ALPRAZolam 0.5 MG tablet Commonly known as:  XANAX   amLODipine 10 MG  tablet Commonly known as:  NORVASC   aspirin EC 325 MG tablet   carvedilol 25 MG tablet Commonly known as:  COREG   cetirizine 10 MG tablet Commonly known as:  ZYRTEC   cholecalciferol 1000 units tablet Commonly known as:  VITAMIN D   enalapril 10 MG tablet Commonly known as:  VASOTEC   LORazepam 2 MG/ML injection Commonly known as:  ATIVAN   methocarbamol 500 MG tablet Commonly known as:  ROBAXIN   traMADol 50 MG tablet Commonly known as:  ULTRAM   UNABLE TO FIND     TAKE these medications   acetaminophen 500 MG tablet Commonly known as:  TYLENOL Take 1 tablet (500 mg total) by mouth every 4 (four) hours as needed for mild pain, moderate pain, fever or headache.   bethanechol 10 MG tablet Commonly known as:  URECHOLINE Take 10 mg by mouth 3 (three) times daily.   finasteride 5 MG tablet Commonly known as:  PROSCAR Take 5 mg by mouth daily.   fluocinonide cream 0.05 % Commonly known as:  LIDEX Apply 1 application topically 2 (two) times daily as needed (for rash).   ipratropium 0.06 % nasal spray Commonly known as:  ATROVENT USE 2 SPRAYS IN EACH NOSTRIL THREE TIMES A DAY AS NEEDED FOR ALLERGIES   lidocaine 5 % Commonly known as:  LIDODERM Place 1 patch onto the skin daily. Remove & Discard  patch within 12 hours or as directed by MD What changed:  additional instructions   polyethylene glycol packet Commonly known as:  MIRALAX / GLYCOLAX Take 17 g by mouth daily as needed for moderate constipation.   RAPAFLO 4 MG Caps capsule Generic drug:  silodosin Take 4 mg by mouth daily with breakfast.   sodium chloride 1 g tablet Take 1 tablet (1 g total) by mouth 3 (three) times daily with meals.            Discharge Care Instructions        Start     Ordered   06/29/17 0000  Increase activity slowly     06/29/17 1157      Allergies  Allergen Reactions  . Aspirin     GI bleeding  . Ciprofloxacin Other (See Comments)    Caused GI bleed  .  Codeine Other (See Comments)    Patient cannot recall reaction  . Iohexol      Desc: HAS SEIZURES WITH X-RAY DYE-GIVEN 120 MG SOLU0MEDROL, 25 MG BENADRYL, AND 25 MG PEPCID 1 HOUR PRIOR TO EXAM AND HAD NO PROBLEMS-ARS-08/15/07   . Macrodantin Other (See Comments)    Patient cannot recall reaction  . Morphine And Related Itching     Procedures/Studies:    Dg Chest 2 View  Result Date: 06/14/2017 CLINICAL DATA:  Initial evaluation for acute chest wall pain status post fall. EXAM: CHEST  2 VIEW COMPARISON:  Prior radiograph from 05/31/2017. FINDINGS: Mild cardiomegaly, stable. Mediastinal silhouette within normal limits. Intrathoracic aorta mildly tortuous with prominent aortic atherosclerosis. Lungs mildly hypoinflated. Mild left basilar atelectasis. No focal infiltrates. No pulmonary edema or pleural effusion. No pneumothorax. Recently identified left-sided rib fractures again noted. No definite new osseous abnormality. Osteopenia. IMPRESSION: 1. Mild left basilar atelectasis. No other active cardiopulmonary disease. 2. Left-sided rib fractures, grossly stable as compared to recent examinations. 3. Aortic atherosclerosis. Electronically Signed   By: Jeannine Boga M.D.   On: 06/14/2017 15:41   Dg Chest 2 View  Result Date: 05/31/2017 CLINICAL DATA:  Left hemo pneumothorax EXAM: CHEST  2 VIEW COMPARISON:  05/30/2017 FINDINGS: Continued mild left base atelectasis. No pneumothorax. Heart is mildly enlarged. Calcified aorta. Right lung is clear. IMPRESSION: Continued left base atelectasis.  No pneumothorax. Cardiomegaly.  Aortic atherosclerosis. Electronically Signed   By: Rolm Baptise M.D.   On: 05/31/2017 10:58   Dg Elbow Complete Left  Result Date: 06/14/2017 CLINICAL DATA:  Initial evaluation for recent fall, trauma. EXAM: LEFT ELBOW - COMPLETE 3+ VIEW COMPARISON:  None. FINDINGS: Acute distracted fracture of the olecranon. Distal humerus intact. Radial head intact as is the proximal  radius. Diffuse soft tissue swelling about the elbow. IMPRESSION: 1. Acute distracted fracture of the olecranon. 2. Extensive soft tissue swelling about the elbow. Electronically Signed   By: Jeannine Boga M.D.   On: 06/14/2017 15:43   Dg Chest Port 1 View  Result Date: 06/27/2017 CLINICAL DATA:  Respiratory distress, cough, congestion EXAM: PORTABLE CHEST 1 VIEW COMPARISON:  06/23/2017 FINDINGS: Mild left lower lung opacity, likely scarring/ atelectasis. No pleural effusion or pneumothorax. The heart is normal in size. Multiple left rib fractures, unchanged. IMPRESSION: Multiple left rib fractures, unchanged.  No pneumothorax is seen. Mild left lower lung opacity, likely scarring/ atelectasis. Electronically Signed   By: Julian Hy M.D.   On: 06/27/2017 02:21   Dg Chest Port 1 View  Result Date: 06/23/2017 CLINICAL DATA:  Altered mental status.  History of hypertension .  EXAM: PORTABLE CHEST 1 VIEW COMPARISON:  06/14/2017 . FINDINGS: Mediastinum and hilar structures normal. Borderline cardiomegaly, stable. No pulmonary venous congestion. No focal infiltrate. Old left rib fractures. Prior cervical spine fusion. IMPRESSION: 1. No acute cardiopulmonary disease. 2. Borderline cardiomegaly. Electronically Signed   By: Marcello Moores  Register   On: 06/23/2017 14:21   Dg Chest Port 1 View  Result Date: 05/30/2017 CLINICAL DATA:  Left rib fractures and hemopneumothorax requiring a left chest tube after falling at home on 05/27/2017. Followup. EXAM: PORTABLE CHEST 1 VIEW COMPARISON:  Earlier today. FINDINGS: The left chest tube has been removed. No pneumothorax seen. Multiple displaced left lateral rib fractures. Interval mild increase in left basilar atelectasis. Stable enlarged heart and tortuous and calcified thoracic aorta. Thoracic spine degenerative changes. Cervical spine fixation hardware. Diffuse osteopenia. IMPRESSION: 1. No pneumothorax following left chest tube removal. 2. Mildly increased  mild left basilar atelectasis. 3. Stable cardiomegaly and left rib fractures. Electronically Signed   By: Claudie Revering M.D.   On: 05/30/2017 20:03       Discharge Exam: Vitals:   06/28/17 0430 06/29/17 0432  BP: (!) 118/46 (!) 102/23  Pulse: (!) 107 (!) 110  Resp: 15   Temp: 98.3 F (36.8 C) 98.4 F (36.9 C)  SpO2: 92% 91%   Vitals:   06/27/17 0600 06/27/17 0646 06/28/17 0430 06/29/17 0432  BP: (!) 71/39 (!) 138/120 (!) 118/46 (!) 102/23  Pulse: 78 80 (!) 107 (!) 110  Resp: 15 15 15    Temp:   98.3 F (36.8 C) 98.4 F (36.9 C)  TempSrc:   Axillary Axillary  SpO2: 100% (!) 88% 92% 91%    General: Pt is alert, awake, not in acute distress Cardiovascular: RRR, S1/S2 +, no rubs, no gallops Respiratory: CTA bilaterally, no wheezing, no rhonchi Abdominal: Soft, NT, ND, bowel sounds + Extremities: no edema, no cyanosis    The results of significant diagnostics from this hospitalization (including imaging, microbiology, ancillary and laboratory) are listed below for reference.     Microbiology: Recent Results (from the past 240 hour(s))  Blood Culture (routine x 2)     Status: None (Preliminary result)   Collection Time: 06/27/17  1:45 AM  Result Value Ref Range Status   Specimen Description BLOOD RIGHT WRIST  Final   Special Requests   Final    BOTTLES DRAWN AEROBIC AND ANAEROBIC Blood Culture adequate volume   Culture NO GROWTH 2 DAYS  Final   Report Status PENDING  Incomplete  Urine culture     Status: None   Collection Time: 06/27/17  1:55 AM  Result Value Ref Range Status   Specimen Description URINE, RANDOM  Final   Special Requests NONE  Final   Culture NO GROWTH  Final   Report Status 06/28/2017 FINAL  Final  Blood Culture (routine x 2)     Status: None (Preliminary result)   Collection Time: 06/27/17  2:07 AM  Result Value Ref Range Status   Specimen Description BLOOD RIGHT ANTECUBITAL  Final   Special Requests IN PEDIATRIC BOTTLE Blood Culture adequate  volume  Final   Culture NO GROWTH 2 DAYS  Final   Report Status PENDING  Incomplete     Labs: BNP (last 3 results)  Recent Labs  06/27/17 0155  BNP 02.4   Basic Metabolic Panel:  Recent Labs Lab 06/23/17 1513 06/27/17 0145  NA 141 148*  K 4.1 4.7  CL 105 116*  CO2  --  21*  GLUCOSE 106*  150*  BUN 24* 45*  CREATININE 1.20 2.54*  CALCIUM  --  9.1   Liver Function Tests:  Recent Labs Lab 06/27/17 0145  AST 24  ALT 15*  ALKPHOS 54  BILITOT 1.3*  PROT 6.1*  ALBUMIN 2.7*   No results for input(s): LIPASE, AMYLASE in the last 168 hours. No results for input(s): AMMONIA in the last 168 hours. CBC:  Recent Labs Lab 06/23/17 1513 06/27/17 0145  WBC  --  13.1*  NEUTROABS  --  11.9*  HGB 7.8* 8.6*  HCT 23.0* 26.5*  MCV  --  97.4  PLT  --  241   Cardiac Enzymes: No results for input(s): CKTOTAL, CKMB, CKMBINDEX, TROPONINI in the last 168 hours. BNP: Invalid input(s): POCBNP CBG: No results for input(s): GLUCAP in the last 168 hours. D-Dimer No results for input(s): DDIMER in the last 72 hours. Hgb A1c No results for input(s): HGBA1C in the last 72 hours. Lipid Profile No results for input(s): CHOL, HDL, LDLCALC, TRIG, CHOLHDL, LDLDIRECT in the last 72 hours. Thyroid function studies No results for input(s): TSH, T4TOTAL, T3FREE, THYROIDAB in the last 72 hours.  Invalid input(s): FREET3 Anemia work up No results for input(s): VITAMINB12, FOLATE, FERRITIN, TIBC, IRON, RETICCTPCT in the last 72 hours. Urinalysis    Component Value Date/Time   COLORURINE AMBER (A) 06/27/2017 0155   APPEARANCEUR CLOUDY (A) 06/27/2017 0155   LABSPEC 1.017 06/27/2017 0155   PHURINE 5.0 06/27/2017 0155   GLUCOSEU 50 (A) 06/27/2017 0155   HGBUR MODERATE (A) 06/27/2017 0155   BILIRUBINUR NEGATIVE 06/27/2017 0155   KETONESUR NEGATIVE 06/27/2017 0155   PROTEINUR 100 (A) 06/27/2017 0155   UROBILINOGEN 0.2 11/23/2011 0430   NITRITE NEGATIVE 06/27/2017 0155   LEUKOCYTESUR  MODERATE (A) 06/27/2017 0155   Sepsis Labs Invalid input(s): PROCALCITONIN,  WBC,  LACTICIDVEN Microbiology Recent Results (from the past 240 hour(s))  Blood Culture (routine x 2)     Status: None (Preliminary result)   Collection Time: 06/27/17  1:45 AM  Result Value Ref Range Status   Specimen Description BLOOD RIGHT WRIST  Final   Special Requests   Final    BOTTLES DRAWN AEROBIC AND ANAEROBIC Blood Culture adequate volume   Culture NO GROWTH 2 DAYS  Final   Report Status PENDING  Incomplete  Urine culture     Status: None   Collection Time: 06/27/17  1:55 AM  Result Value Ref Range Status   Specimen Description URINE, RANDOM  Final   Special Requests NONE  Final   Culture NO GROWTH  Final   Report Status 06/28/2017 FINAL  Final  Blood Culture (routine x 2)     Status: None (Preliminary result)   Collection Time: 06/27/17  2:07 AM  Result Value Ref Range Status   Specimen Description BLOOD RIGHT ANTECUBITAL  Final   Special Requests IN PEDIATRIC BOTTLE Blood Culture adequate volume  Final   Culture NO GROWTH 2 DAYS  Final   Report Status PENDING  Incomplete     Time coordinating discharge: Over 30 minutes  SIGNED:   Debbe Odea, MD  [facility] 06/29/2017, 11:57 AM Pager   If 7PM-7AM, please contact night-coverage [email or web address] Password [email or web address]

## 2017-06-29 NOTE — Clinical Social Work Note (Signed)
CSW spoke with [facility] and they have a bed for pt today. CSW will set up transport following d/c summary and order.  [place], [place]

## 2017-06-29 NOTE — Clinical Social Work Note (Signed)
Clinical Social Worker facilitated patient discharge including contacting patient family and facility to confirm patient discharge plans.  Clinical information faxed to facility and family agreeable with plan.  CSW arranged ambulance transport via [facility] to [facility] .  RN to call [phone] for report prior to discharge.  Clinical Social Worker will sign off for now as social work intervention is no longer needed. Please consult us again if new need arises.  Shyheim Tanney, LCSWA [phone]   

## 2017-06-29 NOTE — Progress Notes (Signed)
Wasted 125cc of fentanyl drip. Waste was witnessed by Hulan Amato, RN, charge.

## 2017-06-29 NOTE — Progress Notes (Signed)
[facility] Liaison: RN visit  Received request from [employer] for family interest in [facility] with request for transfer today. Chart reviewed. Met with daughter Lorraine Lax to confirm interest and explain services. Family agreeable to transfer today. CSW aware. Registration paperwork completed. Dr. Orpah Melter to assume care per family request. Please fax discharge summary to [phone]. RN please call report to [phone]. Please arrange transport for the patient to arrive as soon as possible.   Thank you.   Farrel Gordon, RN, [facility]iaison  [phone]

## 2017-06-29 NOTE — Progress Notes (Signed)
Called report to Judeen Hammans, Therapist, sports at [employer].

## 2017-06-29 NOTE — Progress Notes (Signed)
125 cc of Fentanyl drip wasted by Barrett Henle, RN prior to pt leaving for [facility].

## 2017-07-02 LAB — CULTURE, BLOOD (ROUTINE X 2)
CULTURE: NO GROWTH
Culture: NO GROWTH
SPECIAL REQUESTS: ADEQUATE
Special Requests: ADEQUATE

## 2017-07-05 DEATH — deceased

## 2017-07-10 ENCOUNTER — Telehealth: Payer: Self-pay | Admitting: Hematology and Oncology

## 2017-07-10 NOTE — Telephone Encounter (Signed)
Thanks for the update

## 2017-07-10 NOTE — Telephone Encounter (Signed)
Patient daughter called to inform us that patient has passed away

## 2017-08-07 ENCOUNTER — Other Ambulatory Visit: Payer: Medicare Other

## 2017-08-14 ENCOUNTER — Ambulatory Visit: Payer: Medicare Other | Admitting: Hematology and Oncology

## 2017-09-05 IMAGING — DX DG CHEST 2V
2 series · 3 of 3 positions shown · non-contrast
Comparison: Prior radiograph from 05/31/2017.

CLINICAL DATA: Initial evaluation for acute chest wall pain status
post fall.

EXAM:
CHEST  2 VIEW

[Series 2: chest lat · 0.14mm/px · 2 of 2 slices shown]
[im 1/2]
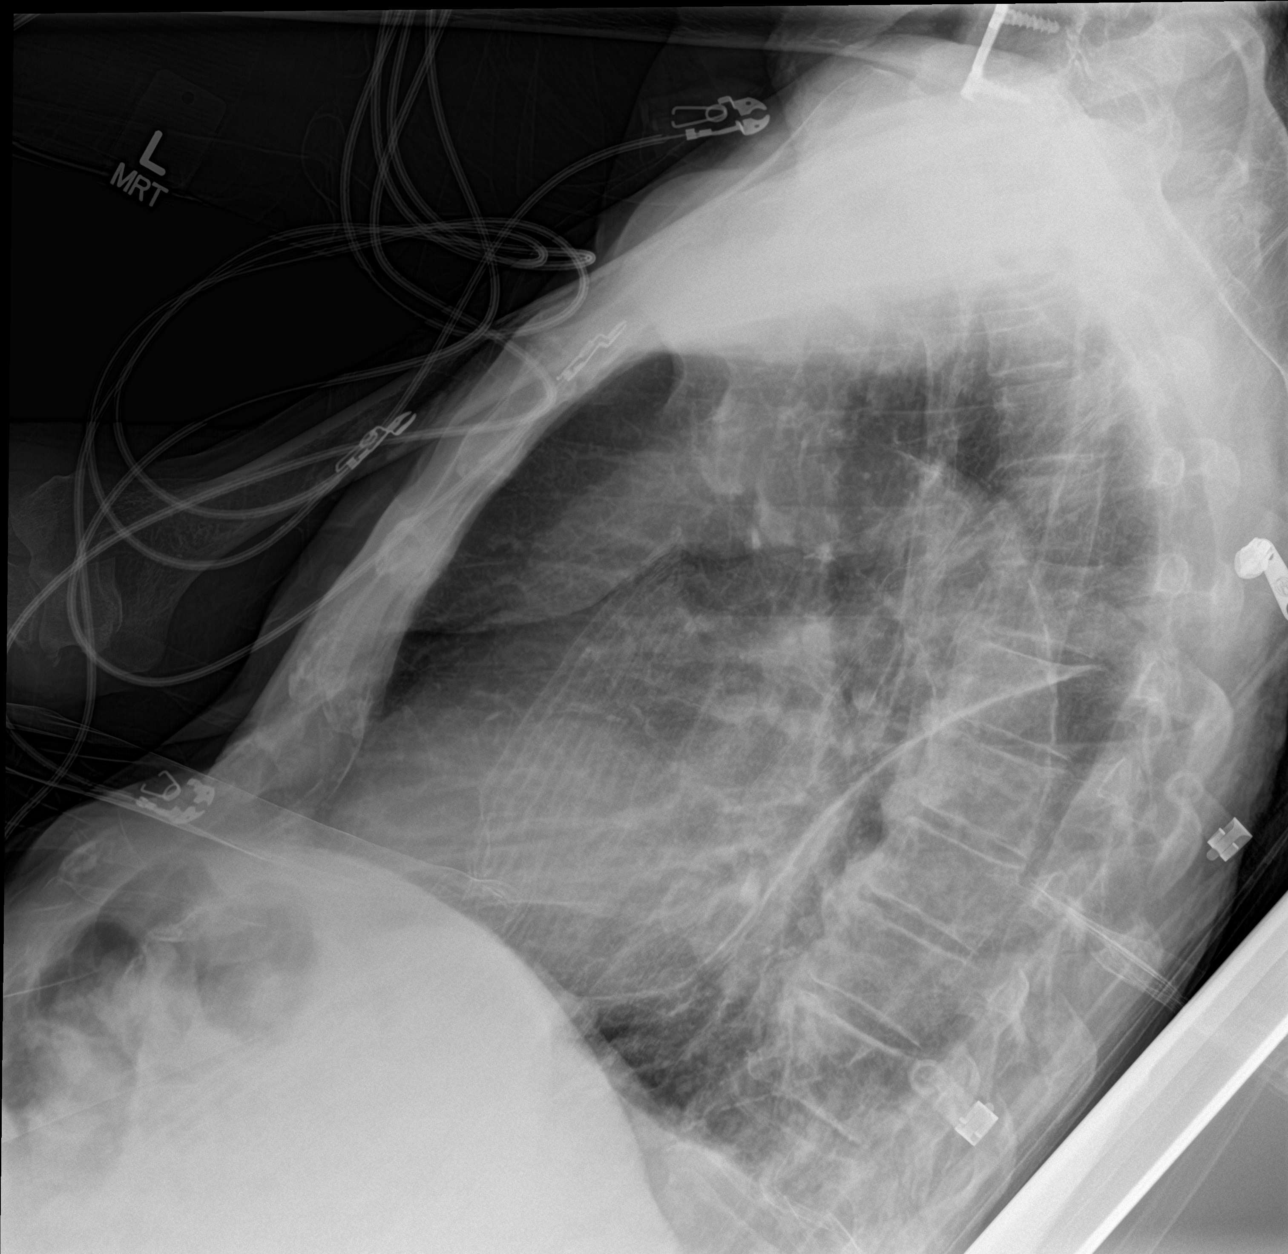
[im 2/2]
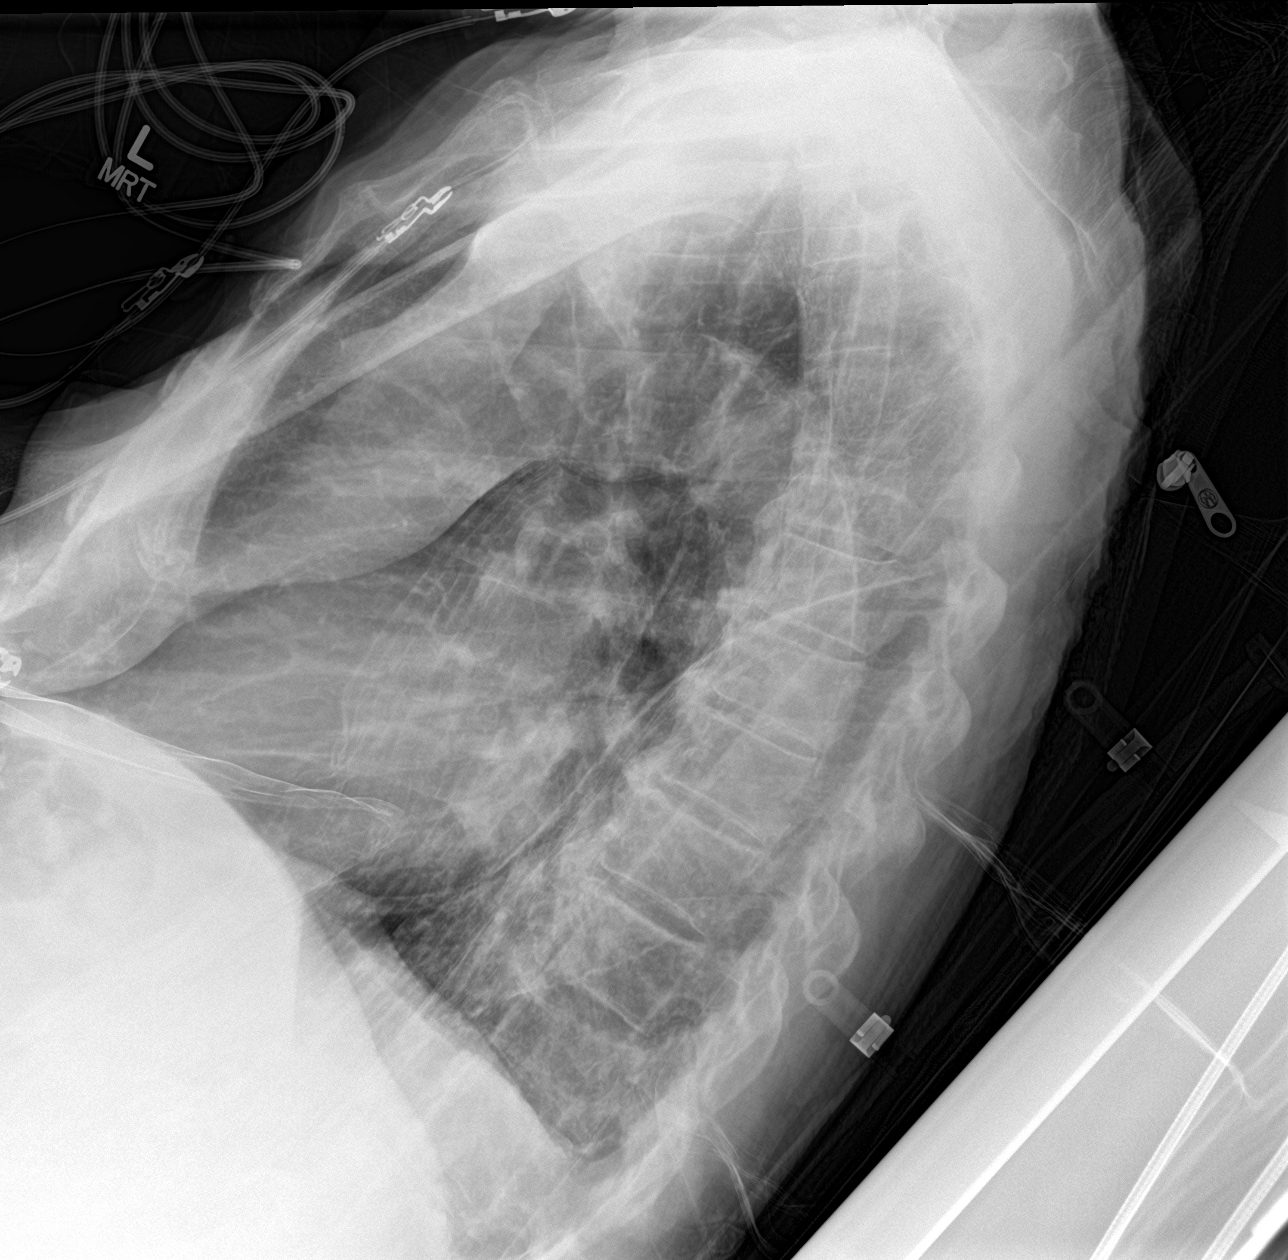

[chest ap]
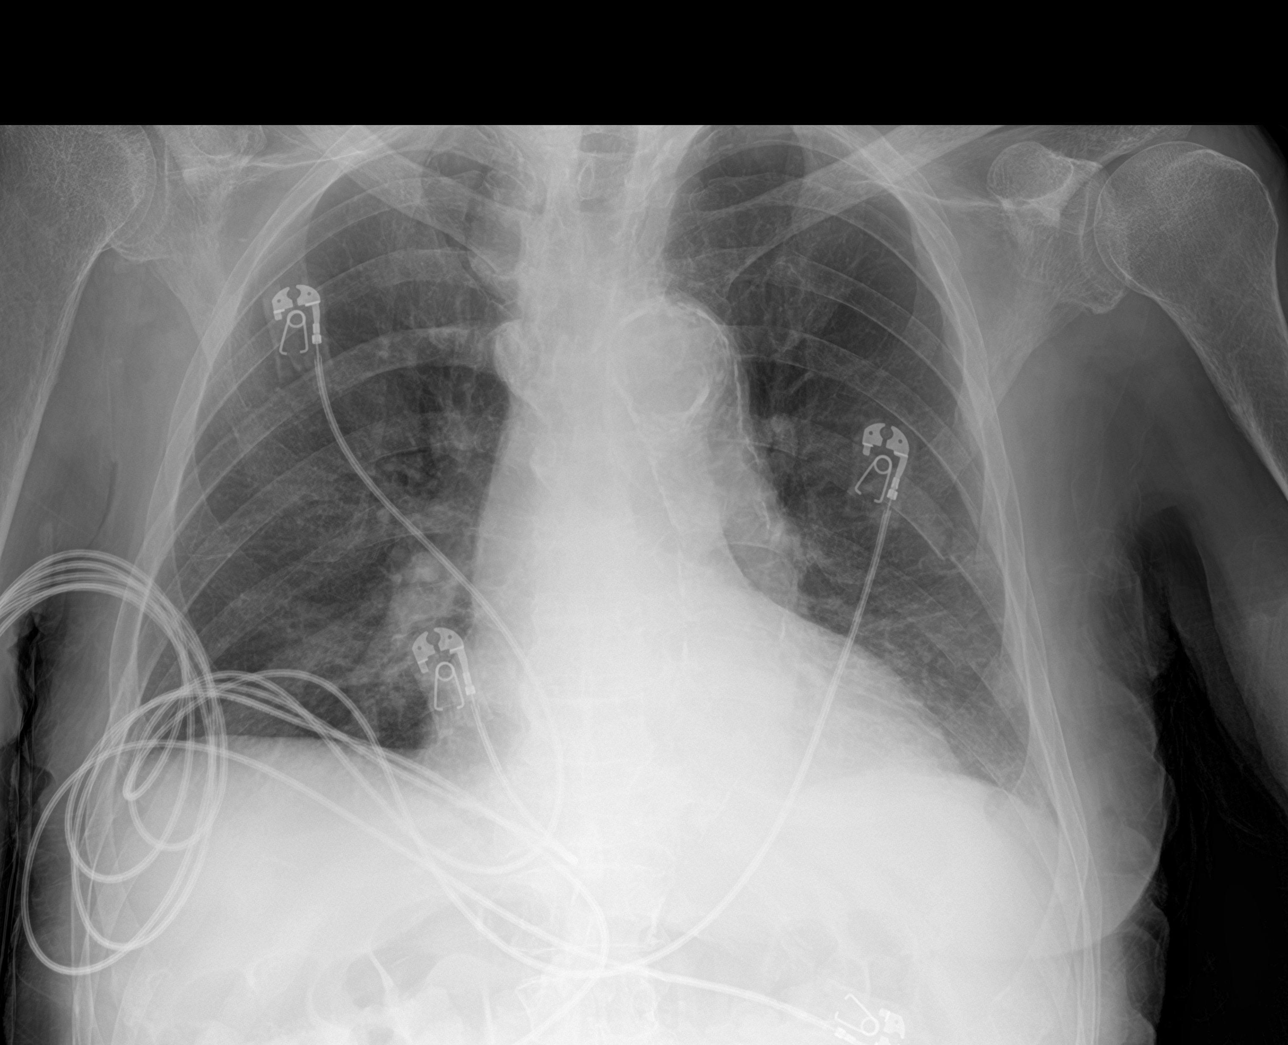

[3 of 3 positions shown; findings below may reference images not displayed]

FINDINGS: Mild cardiomegaly, stable. Mediastinal silhouette within normal
limits. Intrathoracic aorta mildly tortuous with prominent aortic
atherosclerosis.

Lungs mildly hypoinflated. Mild left basilar atelectasis. No focal
infiltrates. No pulmonary edema or pleural effusion. No
pneumothorax.

Recently identified left-sided rib fractures again noted. No
definite new osseous abnormality. Osteopenia.
IMPRESSION: 1. Mild left basilar atelectasis. No other active cardiopulmonary
disease.
2. Left-sided rib fractures, grossly stable as compared to recent
examinations.
3. Aortic atherosclerosis.

## 2017-09-05 IMAGING — DX DG ELBOW COMPLETE 3+V*L*
4 series · 4 of 4 positions shown · non-contrast
Comparison: None.

CLINICAL DATA: Initial evaluation for recent fall, trauma.

EXAM:
LEFT ELBOW - COMPLETE 3+ VIEW

[elbow ap]
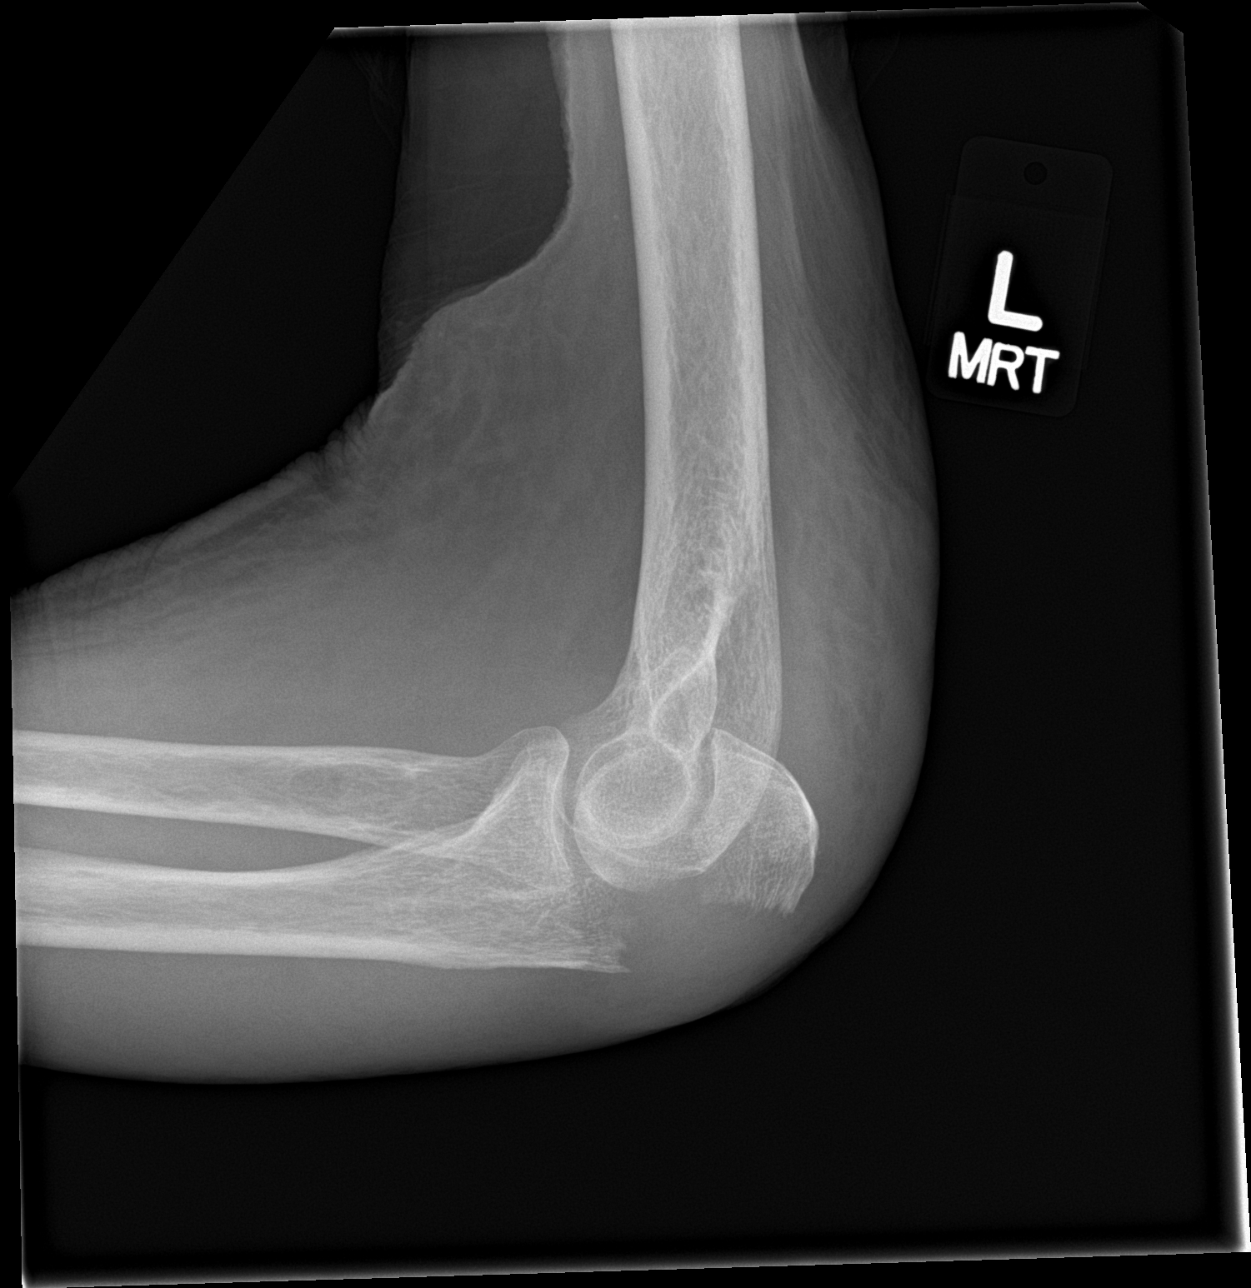

[elbow obl (1 of 2)]
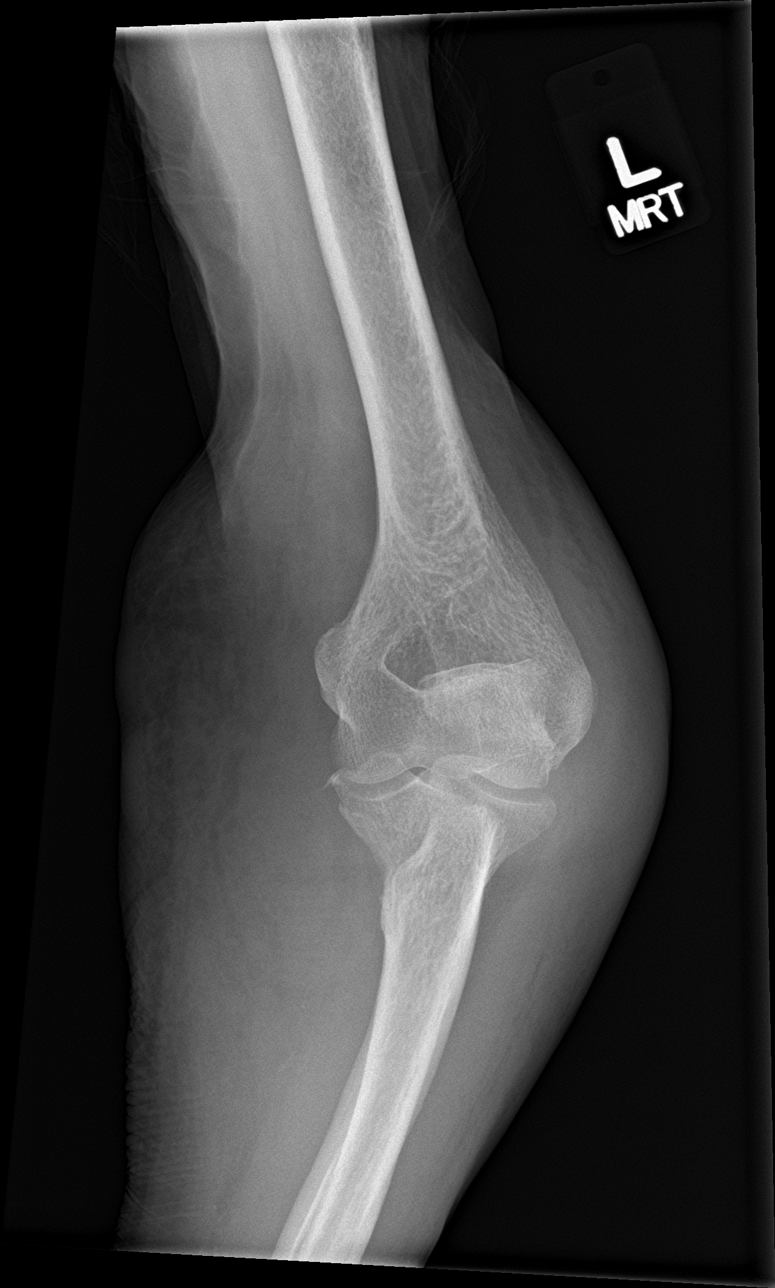

[elbow obl (2 of 2)]
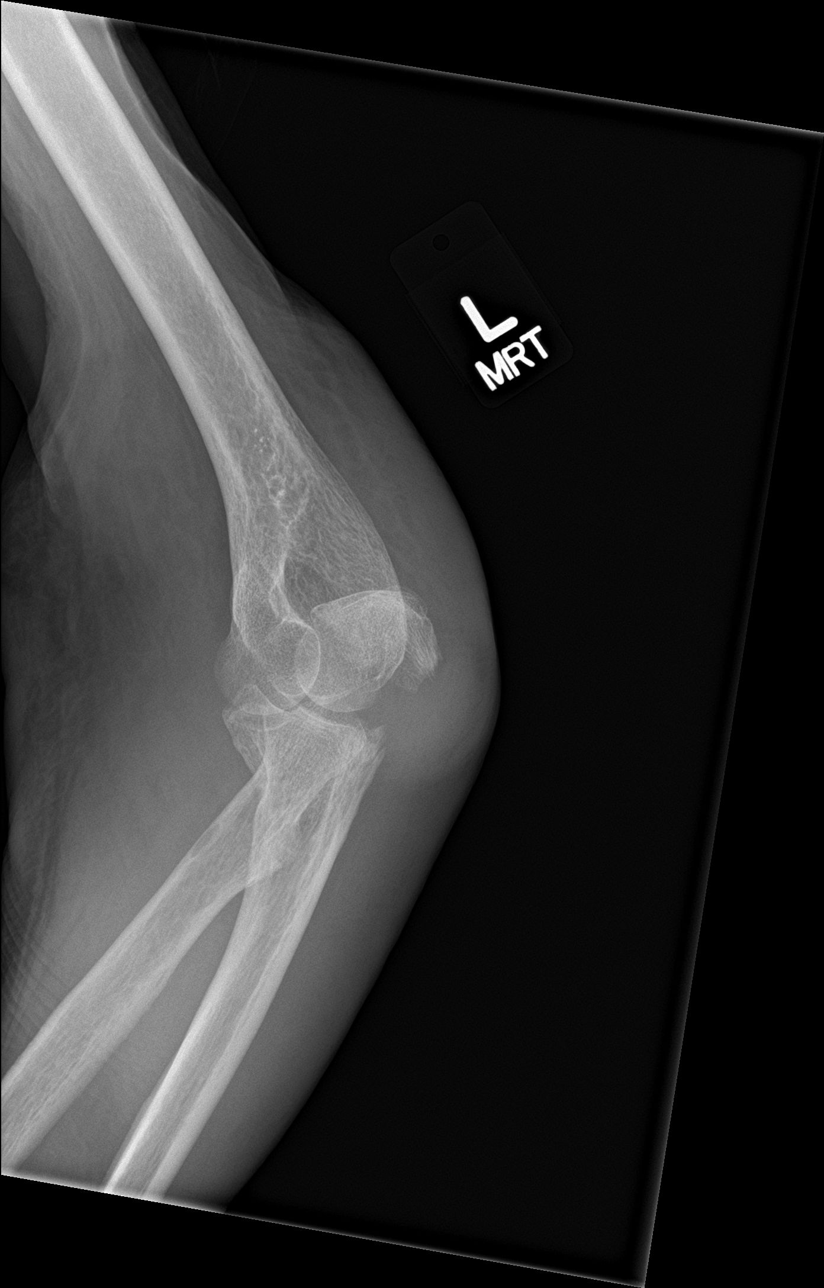

[elbow lat]
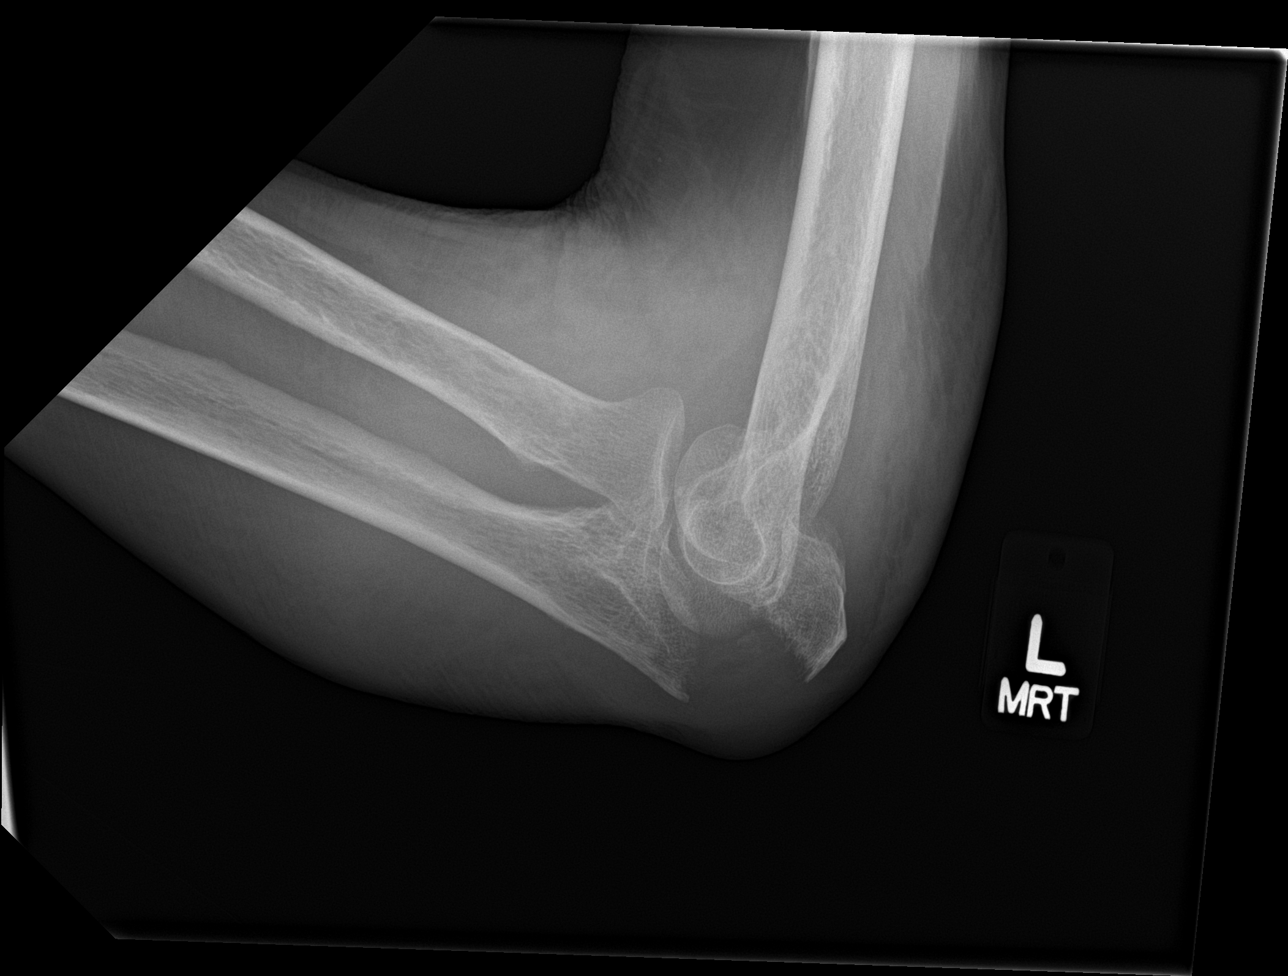

[4 of 4 positions shown; findings below may reference images not displayed]

FINDINGS: Acute distracted fracture of the olecranon. Distal humerus intact.
Radial head intact as is the proximal radius. Diffuse soft tissue
swelling about the elbow.
IMPRESSION: 1. Acute distracted fracture of the olecranon.
2. Extensive soft tissue swelling about the elbow.
